# Patient Record
Sex: Female | Born: 1945 | Race: Black or African American | Hispanic: No | Marital: Married | Smoking: Never smoker
Health system: Southern US, Community
[De-identification: ages and names within clinical notes are randomized; demographics above are authoritative.]

## PROBLEM LIST (undated history)

## (undated) DIAGNOSIS — D696 Thrombocytopenia, unspecified: Secondary | ICD-10-CM

## (undated) DIAGNOSIS — E7800 Pure hypercholesterolemia, unspecified: Secondary | ICD-10-CM

## (undated) DIAGNOSIS — R7303 Prediabetes: Secondary | ICD-10-CM

## (undated) DIAGNOSIS — H25019 Cortical age-related cataract, unspecified eye: Secondary | ICD-10-CM

## (undated) DIAGNOSIS — E559 Vitamin D deficiency, unspecified: Secondary | ICD-10-CM

## (undated) DIAGNOSIS — E669 Obesity, unspecified: Secondary | ICD-10-CM

## (undated) DIAGNOSIS — K219 Gastro-esophageal reflux disease without esophagitis: Secondary | ICD-10-CM

## (undated) HISTORY — PX: ABDOMINAL HYSTERECTOMY: SHX81

## (undated) HISTORY — PX: COLONOSCOPY: SHX174

---

## 2004-12-30 ENCOUNTER — Ambulatory Visit: Payer: Self-pay | Admitting: Family Medicine

## 2006-01-08 ENCOUNTER — Ambulatory Visit: Payer: Self-pay | Admitting: Family Medicine

## 2007-04-20 ENCOUNTER — Ambulatory Visit: Payer: Self-pay

## 2015-03-26 ENCOUNTER — Encounter: Payer: Self-pay | Admitting: Emergency Medicine

## 2015-03-26 ENCOUNTER — Ambulatory Visit
Admission: EM | Admit: 2015-03-26 | Discharge: 2015-03-26 | Disposition: A | Discharge status: Home / Self Care | Payer: Medicare HMO | Attending: Family Medicine | Admitting: Family Medicine

## 2015-03-26 DIAGNOSIS — M549 Dorsalgia, unspecified: Secondary | ICD-10-CM

## 2015-03-26 DIAGNOSIS — H0260 Xanthelasma of unspecified eye, unspecified eyelid: Secondary | ICD-10-CM

## 2015-03-26 DIAGNOSIS — G8929 Other chronic pain: Secondary | ICD-10-CM | POA: Diagnosis not present

## 2015-03-26 DIAGNOSIS — M542 Cervicalgia: Secondary | ICD-10-CM | POA: Diagnosis not present

## 2015-03-26 NOTE — ED Notes (Signed)
Patient c/o ongoing back off and on for years.  Patient states pain has gotten worse over the past couple of days.  Patient states that it has been a long time since she has seen a doctor.

## 2015-03-26 NOTE — Discharge Instructions (Signed)

## 2015-03-26 NOTE — ED Provider Notes (Signed)
CSN: [id number]     Arrival date & time 03/26/15  1226 History   First MD Initiated Contact with Patient 03/26/15 1400     Chief Complaint  Patient presents with  . Back Pain   (Consider location/radiation/quality/duration/timing/severity/associated sxs/prior Treatment) HPI        69 year old female presents with multiple complaints. She has back pain for many years that has not changed at all in the past year, a sensation of fullness in her neck that has been constant for 2 years, skin lesions on her face that have been there as long as she can remember. Also she wants her cholesterol and thyroid checked. She does not have a primary care doctor. Nothing has changed about any of these issues recently. She denies any extremity numbness or weakness. No loss of bowel or bladder control. No chest pain or shortness of breath  History reviewed. No pertinent past medical history. Past Surgical History  Procedure Laterality Date  . Abdominal hysterectomy     History reviewed. No pertinent family history. History  Substance Use Topics  . Smoking status: Never Smoker   . Smokeless tobacco: Never Used  . Alcohol Use: No   OB History    No data available     Review of Systems  Constitutional: Negative for fever and chills.  Eyes: Negative for visual disturbance.  Respiratory: Negative for cough and shortness of breath.   Cardiovascular: Negative for chest pain, palpitations and leg swelling.  Gastrointestinal: Negative for nausea, vomiting and abdominal pain.  Endocrine: Negative for polydipsia and polyuria.  Genitourinary: Negative for dysuria, urgency and frequency.  Musculoskeletal: Positive for back pain and neck pain. Negative for myalgias, arthralgias and neck stiffness.  Skin: Negative for rash.       Lesions around eyes  Neurological: Negative for dizziness, weakness, light-headedness and numbness.  All other systems reviewed and are negative.   Allergies  Lipitor  Home  Medications   Prior to Admission medications   Not on File   BP 154/86 mmHg  Pulse 67  Temp(Src) 97 F (36.1 C) (Tympanic)  Resp 17  Ht 5\' 2"  (1.575 m)  Wt 208 lb 9.6 oz (94.62 kg)  BMI 38.14 kg/m2  SpO2 98% Physical Exam  Constitutional: She is oriented to person, place, and time. Vital signs are normal. She appears well-developed and well-nourished. No distress.  HENT:  Head: Normocephalic and atraumatic.  Eyes: Conjunctivae are normal.  Xanthelasma around eyes  Neck: Trachea normal, normal range of motion and full passive range of motion without pain. Neck supple. Carotid bruit is not present. No thyroid mass and no thyromegaly present.  Cardiovascular: Normal rate, regular rhythm, normal heart sounds and intact distal pulses.   Pulmonary/Chest: Effort normal and breath sounds normal. No respiratory distress. She has no wheezes. She has no rales.  Lymphadenopathy:    She has no cervical adenopathy.  Neurological: She is alert and oriented to person, place, and time. She has normal strength. Coordination normal.  Skin: Skin is warm and dry. No rash noted. She is not diaphoretic.  Psychiatric: She has a normal mood and affect. Judgment normal.  Nursing note and vitals reviewed.   ED Course  Procedures (including critical care time) Labs Review Labs Reviewed - No data to display  Imaging Review No results found.   MDM   1. Chronic back pain   2. Chronic neck pain   3. Xanthelasma, unspecified laterality    Multiple chronic medical problems. No red flags. She  needs a primary care provider. I referred her to the clinic upstairs, she will go upstairs when she leaves here to schedule an appointment.      Liam Graham, PA-C 03/26/15 1425

## 2015-03-28 LAB — LIPID PANEL
CHOLESTEROL: 274 mg/dL — AB (ref 0–200)
HDL: 54 mg/dL (ref 35–70)
LDL CALC: 204 mg/dL

## 2015-03-28 LAB — HEMOGLOBIN A1C: Hgb A1c MFr Bld: 5.9 % (ref 4.0–6.0)

## 2015-04-30 ENCOUNTER — Ambulatory Visit: Payer: Self-pay | Admitting: Family Medicine

## 2015-05-09 ENCOUNTER — Ambulatory Visit (INDEPENDENT_AMBULATORY_CARE_PROVIDER_SITE_OTHER): Payer: Medicare HMO | Admitting: Family Medicine

## 2015-05-09 ENCOUNTER — Encounter: Payer: Self-pay | Admitting: Family Medicine

## 2015-05-09 ENCOUNTER — Other Ambulatory Visit: Payer: Self-pay | Admitting: Family Medicine

## 2015-05-09 VITALS — BP 122/80 | HR 76 | Resp 18 | Ht 62.0 in | Wt 199.8 lb

## 2015-05-09 DIAGNOSIS — M549 Dorsalgia, unspecified: Secondary | ICD-10-CM

## 2015-05-09 DIAGNOSIS — E668 Other obesity: Secondary | ICD-10-CM

## 2015-05-09 DIAGNOSIS — M238X1 Other internal derangements of right knee: Secondary | ICD-10-CM

## 2015-05-09 DIAGNOSIS — E559 Vitamin D deficiency, unspecified: Secondary | ICD-10-CM

## 2015-05-09 DIAGNOSIS — E7800 Pure hypercholesterolemia, unspecified: Secondary | ICD-10-CM | POA: Insufficient documentation

## 2015-05-09 DIAGNOSIS — E669 Obesity, unspecified: Secondary | ICD-10-CM | POA: Insufficient documentation

## 2015-05-09 DIAGNOSIS — G8929 Other chronic pain: Secondary | ICD-10-CM | POA: Insufficient documentation

## 2015-05-09 DIAGNOSIS — R7303 Prediabetes: Secondary | ICD-10-CM

## 2015-05-09 DIAGNOSIS — R7309 Other abnormal glucose: Secondary | ICD-10-CM | POA: Diagnosis not present

## 2015-05-09 DIAGNOSIS — E66812 Obesity, class 2: Secondary | ICD-10-CM | POA: Insufficient documentation

## 2015-05-09 NOTE — Progress Notes (Signed)
Date:  05/09/2015   Name:  Molly Perez   DOB:  19-Jan-1946   MRN:  [id number]  PCP:  No primary care provider on file.    Chief Complaint: Follow-up; Back Pain; and Diabetes   History of Present Illness:  This is a 69 y.o. female for f/u prediabetes and vit d deficiency, has lost 7# past month by eliminating sugary sodas. Unable to exercise due to ligament injury R knee, saw [facility] ortho, told wouldn't fix unless athlete. Taking increased vit D supplement for past month. Told lesions around eyes might be chol deposits, Lipitor made legs ache. Gets lump in throat on occasion, back doing ok for now.  Review of Systems:  Review of Systems  Respiratory: Negative for shortness of breath.   Cardiovascular: Negative for chest pain and leg swelling.    Patient Active Problem List   Diagnosis Date Noted  . Back pain, chronic 05/09/2015  . Hypercholesteremia 05/09/2015  . Extreme obesity 05/09/2015  . Prediabetes 05/09/2015  . Avitaminosis D 05/09/2015    Prior to Admission medications   Medication Sig Start Date End Date Taking? Authorizing Provider  Cholecalciferol (VITAMIN D3) 5000 UNITS CAPS Take 1 capsule by mouth daily. 03/29/15   Historical Provider, MD  ibuprofen (ADVIL,MOTRIN) 200 MG tablet Take 2 tablets by mouth every 4 (four) hours as needed.    Historical Provider, MD    Allergies  Allergen Reactions  . Lipitor [Atorvastatin] Other (See Comments)    Weakness in legs  . Penicillins     Pt states medication upsets her stomach    Past Surgical History  Procedure Laterality Date  . Abdominal hysterectomy      History  Substance Use Topics  . Smoking status: Never Smoker   . Smokeless tobacco: Never Used  . Alcohol Use: No    Family History  Problem Relation Age of Onset  . Alzheimer's disease Father   . Congestive Heart Failure Mother   . Diabetes Mother     Medication list has been reviewed and updated.  Physical Examination: BP 122/80 mmHg  Pulse 76   Resp 18  Ht 5\' 2"  (1.575 m)  Wt 199 lb 12.8 oz (90.629 kg)  BMI 36.53 kg/m2  SpO2 96%  Physical Exam  Constitutional: She appears well-developed and well-nourished. No distress.  Cardiovascular: Normal rate, regular rhythm and normal heart sounds.   Pulmonary/Chest: Effort normal and breath sounds normal.  Musculoskeletal: She exhibits no edema.  R knee tender over MCL    Assessment and Plan:  1. Avitaminosis D Recheck level on increased supplementation - Vitamin D (25 hydroxy)  2. Prediabetes Recheck a1c with weight loss - HgB A1c  3. Extreme obesity Encouraged continued weight loss, increased exercise  4. Hypercholesteremia Recheck with weight loss - Lipid Profile  5. MCL deficiency, knee, right Ok to use knee brace with walking, consider PT if persists (saw [facility] ortho, no surgery recommended)  Return in about 6 months (around 11/09/2015).  Satira Anis. [facility]  05/09/2015

## 2015-05-10 ENCOUNTER — Encounter: Payer: Self-pay | Admitting: Family Medicine

## 2015-05-10 LAB — LIPID PANEL
CHOL/HDL RATIO: 6.2 ratio — AB (ref 0.0–4.4)
Cholesterol, Total: 267 mg/dL — ABNORMAL HIGH (ref 100–199)
HDL: 43 mg/dL (ref >39)
LDL CALC: 206 mg/dL — AB (ref 0–99)
Triglycerides: 88 mg/dL (ref 0–149)
VLDL Cholesterol Cal: 18 mg/dL (ref 5–40)

## 2015-05-10 LAB — VITAMIN D 25 HYDROXY (VIT D DEFICIENCY, FRACTURES)
VIT D 25 HYDROXY: 32.1 ng/mL (ref 30.0–100.0)
VITAMIN D, 25-OH, TOTAL: 19

## 2015-05-10 LAB — HEMOGLOBIN A1C
ESTIMATED AVERAGE GLUCOSE: 123 mg/dL
Hgb A1c MFr Bld: 5.9 % — ABNORMAL HIGH (ref 4.8–5.6)

## 2015-07-31 ENCOUNTER — Encounter: Payer: Self-pay | Admitting: Family Medicine

## 2015-07-31 ENCOUNTER — Ambulatory Visit (INDEPENDENT_AMBULATORY_CARE_PROVIDER_SITE_OTHER): Payer: Medicare HMO | Admitting: Family Medicine

## 2015-07-31 VITALS — BP 148/80 | HR 64 | Ht 60.0 in | Wt 193.2 lb

## 2015-07-31 DIAGNOSIS — R7309 Other abnormal glucose: Secondary | ICD-10-CM

## 2015-07-31 DIAGNOSIS — E538 Deficiency of other specified B group vitamins: Secondary | ICD-10-CM

## 2015-07-31 DIAGNOSIS — M238X1 Other internal derangements of right knee: Secondary | ICD-10-CM

## 2015-07-31 DIAGNOSIS — R221 Localized swelling, mass and lump, neck: Secondary | ICD-10-CM | POA: Diagnosis not present

## 2015-07-31 DIAGNOSIS — M2391 Unspecified internal derangement of right knee: Secondary | ICD-10-CM | POA: Insufficient documentation

## 2015-07-31 DIAGNOSIS — E668 Other obesity: Secondary | ICD-10-CM

## 2015-07-31 DIAGNOSIS — E559 Vitamin D deficiency, unspecified: Secondary | ICD-10-CM | POA: Diagnosis not present

## 2015-07-31 DIAGNOSIS — Z23 Encounter for immunization: Secondary | ICD-10-CM

## 2015-07-31 DIAGNOSIS — E7800 Pure hypercholesterolemia, unspecified: Secondary | ICD-10-CM

## 2015-07-31 DIAGNOSIS — R7303 Prediabetes: Secondary | ICD-10-CM

## 2015-07-31 DIAGNOSIS — M238X9 Other internal derangements of unspecified knee: Secondary | ICD-10-CM | POA: Insufficient documentation

## 2015-07-31 NOTE — Progress Notes (Signed)
Date:  07/31/2015   Name:  Molly Perez   DOB:  May 13, 1946   MRN:  [id number]  PCP:  Adline Potter, MD    Chief Complaint: No chief complaint on file.   History of Present Illness:  This is a 69 y.o. female for f/u prediabetes, obesity, and vit D def. C/o occ lump in throat but no trouble swallowing, thinks had abnormal thyroid test in past. Has lost 6# since last visit, told heart murmur by visiting nurse. Taking vit D supplement daily. Lipids high last visit but intolerant Lipitor due to leg weakness. Needs flu and Pneumovax today. Had normal colonoscopy in past (unsure when) and not interested in another. Declines mammogram. Knee still bothers but tolerable.  Review of Systems:  Review of Systems  Constitutional: Negative for fever, chills and fatigue.  HENT: Negative for trouble swallowing.   Respiratory: Negative for shortness of breath.   Cardiovascular: Negative for chest pain and leg swelling.  Endocrine: Negative for polyuria.  Genitourinary: Negative for difficulty urinating.    Patient Active Problem List   Diagnosis Date Noted  . 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 Back pain, chronic 05/09/2015  . Hypercholesteremia 05/09/2015  . Extreme obesity 05/09/2015  . Prediabetes 05/09/2015  . Avitaminosis D 05/09/2015    Prior to Admission medications   Medication Sig Start Date End Date Taking? Authorizing Provider  Cholecalciferol (VITAMIN D3) 5000 UNITS CAPS Take 1 capsule by mouth daily. 03/29/15  Yes Historical Provider, MD  ibuprofen (ADVIL,MOTRIN) 200 MG tablet Take 2 tablets by mouth every 4 (four) hours as needed.   Yes Historical Provider, MD    Allergies  Allergen Reactions  . Lipitor [Atorvastatin] Other (See Comments)    Weakness in legs  . Penicillins     Pt states medication upsets her stomach    Past Surgical History  Procedure Laterality Date  . Abdominal hysterectomy      Social History  Substance Use Topics  . Smoking status: Never Smoker    . Smokeless tobacco: Never Used  . Alcohol Use: No    Family History  Problem Relation Age of Onset  . Alzheimer's disease Father   . Congestive Heart Failure Mother   . Diabetes Mother     Medication list has been reviewed and updated.  Physical Examination: BP 148/80 mmHg  Pulse 64  Ht 5' (1.524 m)  Wt 193 lb 3.2 oz (87.635 kg)  BMI 37.73 kg/m2  Physical Exam  Constitutional: She appears well-developed and well-nourished. No distress.  HENT:  Mouth/Throat: Oropharynx is clear and moist.  Neck: Neck supple. No thyromegaly present.  Cardiovascular: Normal rate and regular rhythm.   2/6 SEM at apex  Pulmonary/Chest: Effort normal and breath sounds normal.  Musculoskeletal: She exhibits no edema.  Lymphadenopathy:    She has no cervical adenopathy.  Neurological: She is alert. Coordination normal.  Skin: Skin is warm and dry. She is not diaphoretic.  Psychiatric: She has a normal mood and affect. Her behavior is normal.    Assessment and Plan:  1. Prediabetes Well controlled, recheck a1c - HgB A1c  2. Extreme obesity Continue current weight loss, hep C screen recommended by insurance - B12 - TSH - Hepatitis C Antibody  3. Avitaminosis D On high dose supplementation - Vitamin D (25 hydroxy)  4. Sensation of lump in throat No abnormal PE findings, monitor  5. Need for influenza vaccination - Flu Vaccine QUAD 36+ mos PF IM (Fluarix & Fluzone Quad PF) - Pneumovax imm  today  Return in about 4 months (around 11/30/2015).  Satira Anis. [facility]  07/31/2015

## 2015-08-01 LAB — TSH: TSH: 0.666 u[IU]/mL (ref 0.450–4.500)

## 2015-08-01 LAB — VITAMIN B12: Vitamin B-12: 217 pg/mL (ref 211–946)

## 2015-08-01 LAB — HEPATITIS C ANTIBODY

## 2015-08-01 LAB — HEMOGLOBIN A1C
Est. average glucose Bld gHb Est-mCnc: 123 mg/dL
Hgb A1c MFr Bld: 5.9 % — ABNORMAL HIGH (ref 4.8–5.6)

## 2015-08-01 LAB — VITAMIN D 25 HYDROXY (VIT D DEFICIENCY, FRACTURES): Vit D, 25-Hydroxy: 46.5 ng/mL (ref 30.0–100.0)

## 2015-08-01 MED ORDER — VITAMIN B-12 1000 MCG PO TABS: 1000.0000 ug | ORAL_TABLET | Freq: Every day | ORAL | Status: AC

## 2015-08-01 NOTE — Addendum Note (Signed)
Addended by: Adline Potter on: 08/01/2015 08:41 AM   Modules accepted: Orders

## 2015-09-12 DIAGNOSIS — M25462 Effusion, left knee: Secondary | ICD-10-CM | POA: Diagnosis not present

## 2015-09-12 DIAGNOSIS — S8992XA Unspecified injury of left lower leg, initial encounter: Secondary | ICD-10-CM | POA: Diagnosis not present

## 2015-09-12 DIAGNOSIS — M25562 Pain in left knee: Secondary | ICD-10-CM | POA: Diagnosis not present

## 2016-01-23 DIAGNOSIS — M79621 Pain in right upper arm: Secondary | ICD-10-CM | POA: Diagnosis not present

## 2016-01-23 DIAGNOSIS — E6609 Other obesity due to excess calories: Secondary | ICD-10-CM | POA: Diagnosis not present

## 2016-01-23 DIAGNOSIS — E7800 Pure hypercholesterolemia, unspecified: Secondary | ICD-10-CM | POA: Diagnosis not present

## 2016-01-23 DIAGNOSIS — Z1239 Encounter for other screening for malignant neoplasm of breast: Secondary | ICD-10-CM | POA: Diagnosis not present

## 2016-01-23 DIAGNOSIS — R7303 Prediabetes: Secondary | ICD-10-CM | POA: Diagnosis not present

## 2016-01-23 DIAGNOSIS — E559 Vitamin D deficiency, unspecified: Secondary | ICD-10-CM | POA: Diagnosis not present

## 2016-03-05 DIAGNOSIS — H25813 Combined forms of age-related cataract, bilateral: Secondary | ICD-10-CM | POA: Diagnosis not present

## 2016-03-10 ENCOUNTER — Other Ambulatory Visit: Payer: Self-pay | Admitting: Pediatrics

## 2016-03-10 DIAGNOSIS — Z1231 Encounter for screening mammogram for malignant neoplasm of breast: Secondary | ICD-10-CM

## 2016-03-17 ENCOUNTER — Other Ambulatory Visit: Payer: Self-pay | Admitting: Pediatrics

## 2016-03-17 DIAGNOSIS — E559 Vitamin D deficiency, unspecified: Secondary | ICD-10-CM

## 2016-03-20 ENCOUNTER — Ambulatory Visit
Admission: EM | Admit: 2016-03-20 | Discharge: 2016-03-20 | Disposition: A | Discharge status: Home / Self Care | Payer: Medicare HMO | Attending: Emergency Medicine | Admitting: Emergency Medicine

## 2016-03-20 ENCOUNTER — Encounter: Payer: Self-pay | Admitting: Emergency Medicine

## 2016-03-20 DIAGNOSIS — W57XXXA Bitten or stung by nonvenomous insect and other nonvenomous arthropods, initial encounter: Secondary | ICD-10-CM

## 2016-03-20 DIAGNOSIS — T148 Other injury of unspecified body region: Secondary | ICD-10-CM

## 2016-03-20 MED ORDER — MUPIROCIN 2 % EX OINT: 1.0000 "application " | TOPICAL_OINTMENT | Freq: Three times a day (TID) | CUTANEOUS | Status: DC

## 2016-03-20 NOTE — ED Notes (Signed)
Pt presents with tick bite to back, husband pulled off this am but pt believes that it may be some left in the skin.

## 2016-03-20 NOTE — ED Provider Notes (Signed)
HPI  SUBJECTIVE:  Molly Perez is a 70 y.o. female who presents for tick removal. She states that her husband removed a non-engorged tick on her left scapula earlier today, but is concerned because there "is some left the skin". No nausea vomiting fevers bodyaches rash, flulike symptoms. No antipyretic in past 6-8 hours. She is unsure how long the tick was attached. Past medical history negative for diabetes, hypertension. PMD: Dr. Janene Harvey.   History reviewed. No pertinent past medical history.  Past Surgical History  Procedure Laterality Date  . Abdominal hysterectomy      Family History  Problem Relation Age of Onset  . Alzheimer's disease Father   . Congestive Heart Failure Mother   . Diabetes Mother     Social History  Substance Use Topics  . Smoking status: Never Smoker   . Smokeless tobacco: Never Used  . Alcohol Use: No    No current facility-administered medications for this encounter.  Current outpatient prescriptions:  .  Cholecalciferol (VITAMIN D3) 5000 UNITS CAPS, Take 1 capsule by mouth daily., Disp: , Rfl:  .  ibuprofen (ADVIL,MOTRIN) 200 MG tablet, Take 2 tablets by mouth every 4 (four) hours as needed., Disp: , Rfl:  .  mupirocin ointment (BACTROBAN) 2 %, Apply 1 application topically 3 (three) times daily., Disp: 22 g, Rfl: 0 .  vitamin B-12 (CYANOCOBALAMIN) 1000 MCG tablet, Take 1 tablet (1,000 mcg total) by mouth daily., Disp: 30 tablet, Rfl: 0  Allergies  Allergen Reactions  . Lipitor [Atorvastatin] Other (See Comments)    Weakness in legs  . Penicillins     Pt states medication upsets her stomach     ROS  As noted in HPI.   Physical Exam  BP 148/98 mmHg  Pulse 61  Temp(Src) 97.8 F (36.6 C) (Oral)  Resp 18  SpO2 100%  Constitutional: Well developed, well nourished, no acute distress Eyes:  EOMI, conjunctiva normal bilaterally HENT: Normocephalic, atraumatic,mucus membranes moist Respiratory: Normal inspiratory  effort Cardiovascular: Normal rate GI: nondistended skin: Nontender area of erythema with central tick head buried in skin left scapula. Body absent. No bull's-eye rash. No crusting, expressible purulent drainage.  Musculoskeletal: no deformities Neurologic: Alert & oriented x 3, no focal neuro deficits Psychiatric: Speech and behavior appropriate   ED Course   Medications - No data to display  No orders of the defined types were placed in this encounter.    No results found for this or any previous visit (from the past 24 hour(s)). No results found.  ED Clinical Impression  Tick bite   ED Assessment/Plan  Procedure note: Cleaned area with alcohol. Using magnifying glass, forceps and an 18-gauge needle removed tick head in its entirety. Applied bacitracin and a Band-Aid. Patient tolerated procedure well.  Home with Bactroban, local wound care. Given that the tick was not engorged, do not think that she needs antibiotic prophylaxis at this time. She has follow-up with her primary care physician as  scheduled. Discussed signs and symptoms that should prompt return to the urgent care or ER. Patient agrees with plan.  *This clinic note was created using Dragon dictation software. Therefore, there may be occasional mistakes despite careful proofreading.  ?   Melynda Ripple, MD 03/20/16 1254

## 2016-03-20 NOTE — Discharge Instructions (Signed)
Follow-up with your primary care physician as scheduled.. Watch for signs of infection. Return here or see her doctor if it appears to be infected. Keep this clean with soap and water and covered with bacitracin or Bactroban ointment to help prevent infection. Return to the ER for a bull's-eye rash or for the signs and symptoms we discussed.

## 2016-03-24 DIAGNOSIS — Z23 Encounter for immunization: Secondary | ICD-10-CM | POA: Diagnosis not present

## 2016-03-24 DIAGNOSIS — Z0000 Encounter for general adult medical examination without abnormal findings: Secondary | ICD-10-CM | POA: Diagnosis not present

## 2016-03-24 DIAGNOSIS — E7800 Pure hypercholesterolemia, unspecified: Secondary | ICD-10-CM | POA: Diagnosis not present

## 2016-03-24 DIAGNOSIS — Z1239 Encounter for other screening for malignant neoplasm of breast: Secondary | ICD-10-CM | POA: Diagnosis not present

## 2016-03-24 DIAGNOSIS — R7301 Impaired fasting glucose: Secondary | ICD-10-CM | POA: Diagnosis not present

## 2016-03-24 DIAGNOSIS — E6609 Other obesity due to excess calories: Secondary | ICD-10-CM | POA: Diagnosis not present

## 2016-03-24 DIAGNOSIS — Z1211 Encounter for screening for malignant neoplasm of colon: Secondary | ICD-10-CM | POA: Diagnosis not present

## 2016-03-24 DIAGNOSIS — E559 Vitamin D deficiency, unspecified: Secondary | ICD-10-CM | POA: Diagnosis not present

## 2016-03-24 DIAGNOSIS — R5383 Other fatigue: Secondary | ICD-10-CM | POA: Diagnosis not present

## 2016-04-07 ENCOUNTER — Ambulatory Visit
Admission: RE | Admit: 2016-04-07 | Discharge: 2016-04-07 | Disposition: A | Discharge status: Home / Self Care | Payer: Medicare HMO | Source: Ambulatory Visit | Attending: Pediatrics | Admitting: Pediatrics

## 2016-04-07 DIAGNOSIS — M8588 Other specified disorders of bone density and structure, other site: Secondary | ICD-10-CM | POA: Diagnosis not present

## 2016-04-07 DIAGNOSIS — M85851 Other specified disorders of bone density and structure, right thigh: Secondary | ICD-10-CM | POA: Diagnosis not present

## 2016-04-07 DIAGNOSIS — E559 Vitamin D deficiency, unspecified: Secondary | ICD-10-CM | POA: Diagnosis not present

## 2016-04-07 DIAGNOSIS — Z1231 Encounter for screening mammogram for malignant neoplasm of breast: Secondary | ICD-10-CM | POA: Insufficient documentation

## 2016-04-10 ENCOUNTER — Other Ambulatory Visit: Payer: Self-pay | Admitting: Pediatrics

## 2016-04-10 DIAGNOSIS — R928 Other abnormal and inconclusive findings on diagnostic imaging of breast: Secondary | ICD-10-CM

## 2016-04-10 DIAGNOSIS — K219 Gastro-esophageal reflux disease without esophagitis: Secondary | ICD-10-CM | POA: Diagnosis not present

## 2016-04-10 DIAGNOSIS — Z1211 Encounter for screening for malignant neoplasm of colon: Secondary | ICD-10-CM | POA: Diagnosis not present

## 2016-04-10 DIAGNOSIS — R1319 Other dysphagia: Secondary | ICD-10-CM | POA: Diagnosis not present

## 2016-04-11 ENCOUNTER — Other Ambulatory Visit: Payer: Self-pay | Admitting: Nurse Practitioner

## 2016-04-11 DIAGNOSIS — K219 Gastro-esophageal reflux disease without esophagitis: Secondary | ICD-10-CM

## 2016-04-11 DIAGNOSIS — R1319 Other dysphagia: Secondary | ICD-10-CM

## 2016-04-17 ENCOUNTER — Ambulatory Visit
Admission: RE | Admit: 2016-04-17 | Discharge: 2016-04-17 | Disposition: A | Discharge status: Home / Self Care | Payer: Medicare HMO | Source: Ambulatory Visit | Attending: Nurse Practitioner | Admitting: Nurse Practitioner

## 2016-04-17 DIAGNOSIS — K219 Gastro-esophageal reflux disease without esophagitis: Secondary | ICD-10-CM | POA: Insufficient documentation

## 2016-04-17 DIAGNOSIS — R1310 Dysphagia, unspecified: Secondary | ICD-10-CM | POA: Diagnosis not present

## 2016-04-17 DIAGNOSIS — R1319 Other dysphagia: Secondary | ICD-10-CM

## 2016-04-21 ENCOUNTER — Ambulatory Visit
Admission: RE | Admit: 2016-04-21 | Discharge: 2016-04-21 | Disposition: A | Discharge status: Home / Self Care | Payer: Medicare HMO | Source: Ambulatory Visit | Attending: Pediatrics | Admitting: Pediatrics

## 2016-04-21 DIAGNOSIS — R921 Mammographic calcification found on diagnostic imaging of breast: Secondary | ICD-10-CM | POA: Insufficient documentation

## 2016-04-21 DIAGNOSIS — R928 Other abnormal and inconclusive findings on diagnostic imaging of breast: Secondary | ICD-10-CM

## 2016-05-27 DIAGNOSIS — K219 Gastro-esophageal reflux disease without esophagitis: Secondary | ICD-10-CM | POA: Diagnosis not present

## 2016-05-27 DIAGNOSIS — Z0000 Encounter for general adult medical examination without abnormal findings: Secondary | ICD-10-CM | POA: Diagnosis not present

## 2016-06-05 DIAGNOSIS — M1712 Unilateral primary osteoarthritis, left knee: Secondary | ICD-10-CM | POA: Diagnosis not present

## 2016-06-05 DIAGNOSIS — M1710 Unilateral primary osteoarthritis, unspecified knee: Secondary | ICD-10-CM | POA: Diagnosis not present

## 2016-06-05 DIAGNOSIS — M5136 Other intervertebral disc degeneration, lumbar region: Secondary | ICD-10-CM | POA: Diagnosis not present

## 2016-06-05 DIAGNOSIS — M170 Bilateral primary osteoarthritis of knee: Secondary | ICD-10-CM | POA: Diagnosis not present

## 2016-06-05 DIAGNOSIS — M1711 Unilateral primary osteoarthritis, right knee: Secondary | ICD-10-CM | POA: Diagnosis not present

## 2016-06-05 DIAGNOSIS — M545 Low back pain: Secondary | ICD-10-CM | POA: Diagnosis not present

## 2016-07-01 DIAGNOSIS — R1319 Other dysphagia: Secondary | ICD-10-CM | POA: Diagnosis not present

## 2016-07-01 DIAGNOSIS — K219 Gastro-esophageal reflux disease without esophagitis: Secondary | ICD-10-CM | POA: Diagnosis not present

## 2016-07-03 ENCOUNTER — Encounter: Payer: Self-pay | Admitting: *Deleted

## 2016-07-04 ENCOUNTER — Encounter
Admission: RE | Disposition: A | Discharge status: Home / Self Care | Payer: Self-pay | Source: Ambulatory Visit | Attending: Gastroenterology

## 2016-07-04 ENCOUNTER — Ambulatory Visit: Payer: Medicare HMO | Admitting: Anesthesiology

## 2016-07-04 ENCOUNTER — Ambulatory Visit
Admission: RE | Admit: 2016-07-04 | Discharge: 2016-07-04 | Disposition: A | Discharge status: Home / Self Care | Payer: Medicare HMO | Source: Ambulatory Visit | Attending: Gastroenterology | Admitting: Gastroenterology

## 2016-07-04 ENCOUNTER — Encounter: Payer: Self-pay | Admitting: *Deleted

## 2016-07-04 DIAGNOSIS — Z888 Allergy status to other drugs, medicaments and biological substances status: Secondary | ICD-10-CM | POA: Diagnosis not present

## 2016-07-04 DIAGNOSIS — B9681 Helicobacter pylori [H. pylori] as the cause of diseases classified elsewhere: Secondary | ICD-10-CM | POA: Insufficient documentation

## 2016-07-04 DIAGNOSIS — K2960 Other gastritis without bleeding: Secondary | ICD-10-CM | POA: Diagnosis not present

## 2016-07-04 DIAGNOSIS — E559 Vitamin D deficiency, unspecified: Secondary | ICD-10-CM | POA: Diagnosis not present

## 2016-07-04 DIAGNOSIS — E7800 Pure hypercholesterolemia, unspecified: Secondary | ICD-10-CM | POA: Diagnosis not present

## 2016-07-04 DIAGNOSIS — K2970 Gastritis, unspecified, without bleeding: Secondary | ICD-10-CM | POA: Diagnosis not present

## 2016-07-04 DIAGNOSIS — K219 Gastro-esophageal reflux disease without esophagitis: Secondary | ICD-10-CM | POA: Diagnosis not present

## 2016-07-04 DIAGNOSIS — Z79899 Other long term (current) drug therapy: Secondary | ICD-10-CM | POA: Diagnosis not present

## 2016-07-04 DIAGNOSIS — Z880 Allergy status to penicillin: Secondary | ICD-10-CM | POA: Insufficient documentation

## 2016-07-04 DIAGNOSIS — E669 Obesity, unspecified: Secondary | ICD-10-CM | POA: Diagnosis not present

## 2016-07-04 DIAGNOSIS — R1310 Dysphagia, unspecified: Secondary | ICD-10-CM | POA: Diagnosis not present

## 2016-07-04 DIAGNOSIS — Z1211 Encounter for screening for malignant neoplasm of colon: Secondary | ICD-10-CM | POA: Diagnosis not present

## 2016-07-04 DIAGNOSIS — Z6837 Body mass index (BMI) 37.0-37.9, adult: Secondary | ICD-10-CM | POA: Insufficient documentation

## 2016-07-04 DIAGNOSIS — K2900 Acute gastritis without bleeding: Secondary | ICD-10-CM | POA: Diagnosis not present

## 2016-07-04 DIAGNOSIS — R7303 Prediabetes: Secondary | ICD-10-CM | POA: Insufficient documentation

## 2016-07-04 DIAGNOSIS — K449 Diaphragmatic hernia without obstruction or gangrene: Secondary | ICD-10-CM | POA: Diagnosis not present

## 2016-07-04 HISTORY — DX: Cortical age-related cataract, unspecified eye: H25.019

## 2016-07-04 HISTORY — DX: Vitamin D deficiency, unspecified: E55.9

## 2016-07-04 HISTORY — DX: Gastro-esophageal reflux disease without esophagitis: K21.9

## 2016-07-04 HISTORY — DX: Prediabetes: R73.03

## 2016-07-04 HISTORY — DX: Obesity, unspecified: E66.9

## 2016-07-04 HISTORY — DX: Pure hypercholesterolemia, unspecified: E78.00

## 2016-07-04 HISTORY — PX: ESOPHAGOGASTRODUODENOSCOPY (EGD) WITH PROPOFOL: SHX5813

## 2016-07-04 HISTORY — PX: COLONOSCOPY WITH PROPOFOL: SHX5780

## 2016-07-04 SURGERY — COLONOSCOPY WITH PROPOFOL
Anesthesia: General

## 2016-07-04 MED ORDER — LIDOCAINE HCL (CARDIAC) 20 MG/ML IV SOLN
INTRAVENOUS | Status: DC | PRN
  Administered 2016-07-04: 100 mg via INTRAVENOUS

## 2016-07-04 MED ORDER — GLYCOPYRROLATE 0.2 MG/ML IJ SOLN
INTRAMUSCULAR | Status: DC | PRN
  Administered 2016-07-04: 0.2 mg via INTRAVENOUS

## 2016-07-04 MED ORDER — SODIUM CHLORIDE 0.9 % IV SOLN
INTRAVENOUS | Status: DC
  Administered 2016-07-04: 10:00:00 via INTRAVENOUS

## 2016-07-04 MED ORDER — PROPOFOL 500 MG/50ML IV EMUL
INTRAVENOUS | Status: DC | PRN
Start: 2016-07-04 — End: 2016-07-04
  Administered 2016-07-04: 150 ug/kg/min via INTRAVENOUS

## 2016-07-04 MED ORDER — PROPOFOL 10 MG/ML IV BOLUS
INTRAVENOUS | Status: DC | PRN
  Administered 2016-07-04: 60 mg via INTRAVENOUS
  Administered 2016-07-04: 50 mg via INTRAVENOUS

## 2016-07-04 MED ORDER — SODIUM CHLORIDE 0.9 % IV SOLN: INTRAVENOUS | Status: DC

## 2016-07-04 NOTE — Transfer of Care (Signed)
Immediate Anesthesia Transfer of Care Note  Patient: Molly Perez  Procedure(s) Performed: Procedure(s): COLONOSCOPY WITH PROPOFOL (N/A) ESOPHAGOGASTRODUODENOSCOPY (EGD) WITH PROPOFOL (N/A)  Patient Location: Endoscopy Unit  Anesthesia Type:General  Level of Consciousness: sedated  Airway & Oxygen Therapy: Patient Spontanous Breathing and Patient connected to nasal cannula oxygen  Post-op Assessment: Report given to RN and Post -op Vital signs reviewed and stable  Post vital signs: Reviewed and stable  Last Vitals:  Vitals:   07/04/16 0856  BP: (!) 162/92  Pulse: 64  Resp: 15  Temp: (!) 36.1 C    Last Pain:  Vitals:   07/04/16 0856  TempSrc: Tympanic         Complications: No apparent anesthesia complications

## 2016-07-04 NOTE — Op Note (Signed)
[facility] Gastroenterology Patient Name: Molly Perez Procedure Date: 07/04/2016 9:52 AM MRN: [id number] Account #: [id number] Date of Birth: [birth year] Admit Type: Outpatient Age: 70 Room: [facility] ENDO ROOM 3 Gender: Female Note Status: Finalized Procedure:            Upper GI endoscopy Indications:          Dysphagia, Gastro-esophageal reflux disease Providers:            Lollie Sails, MD Referring MD:         Ane Payment, MD (Referring MD) Medicines:            Monitored Anesthesia Care Complications:        No immediate complications. Procedure:            Pre-Anesthesia Assessment:                       - ASA Grade Assessment: II - A patient with mild                        systemic disease.                       After obtaining informed consent, the endoscope was                        passed under direct vision. Throughout the procedure,                        the patient's blood pressure, pulse, and oxygen                        saturations were monitored continuously. The Endoscope                        was introduced through the mouth, and advanced to the                        third part of duodenum. The upper GI endoscopy was                        accomplished without difficulty. The patient tolerated                        the procedure well. Findings:      The Z-line was variable. Biopsies were taken with a cold forceps for       histology.      prominant cricopharyngeus, no evidence of stenosis orr stricture      The exam of the esophagus was otherwise normal.      Diffuse mild mucosal variance characterized by altered texture,       papillary in appearance was found in the entire examined stomach.       Biopsies were taken with a cold forceps for histology.      The examined duodenum was normal.      A small hiatal hernia was found. The Z-line was a variable distance from       incisors; the hiatal hernia was sliding. Impression:            - Z-line variable. Biopsied.                       -  Gastric mucosal variant. Biopsied.                       - Normal examined duodenum. Recommendation:       - Continue present medications.                       - Await pathology results. Procedure Code(s):    --- Professional ---                       [phone], Esophagogastroduodenoscopy, flexible, transoral;                        with biopsy, single or multiple Diagnosis Code(s):    --- Professional ---                       K22.8, Other specified diseases of esophagus                       K31.89, Other diseases of stomach and duodenum                       R13.10, Dysphagia, unspecified                       K21.9, Gastro-esophageal reflux disease without                        esophagitis CPT copyright 2016 American [employer] Association. All rights reserved. The codes documented in this report are preliminary and upon coder review may  be revised to meet current compliance requirements. Lollie Sails, MD 07/04/2016 10:13:36 AM This report has been signed electronically. Number of Addenda: 0 Note Initiated On: 07/04/2016 9:52 AM      [facility]

## 2016-07-04 NOTE — H&P (Signed)
Outpatient short stay form Pre-procedure 07/04/2016 9:51 AM Lollie Sails MD  Primary Physician: Dr. Barbaraann Boys  Reason for visit:  EGD and colonoscopy  History of present illness:  Patient is a 70 year old female presenting today as above. She has a history some amount of swallowing difficulty however a barium swallow done on 04/17/2016 was completely normal with passage of tablet. She has very poor dentition. We discussed appropriate measures to help her with eating in regards to cutting and chewing appropriately as well as putting pills in thicker fluids or applesauce.  She tolerated her prep well. Takes no aspirin or blood thinning agents.    Current Facility-Administered Medications:  .  0.9 %  sodium chloride infusion, , Intravenous, Continuous, Lollie Sails, MD, Last Rate: 20 mL/hr at 07/04/16 0931 .  0.9 %  sodium chloride infusion, , Intravenous, Continuous, Lollie Sails, MD  Prescriptions Prior to Admission  Medication Sig Dispense Refill Last Dose  . Cholecalciferol (VITAMIN D3) 5000 UNITS CAPS Take 1 capsule by mouth daily.   Past Week at Unknown time  . ibuprofen (ADVIL,MOTRIN) 200 MG tablet Take 2 tablets by mouth every 4 (four) hours as needed.   Past Week at Unknown time  . mupirocin ointment (BACTROBAN) 2 % Apply 1 application topically 3 (three) times daily. 22 g 0 Past Week at Unknown time  . pantoprazole (PROTONIX) 40 MG tablet Take 40 mg by mouth daily.   Past Week at Unknown time  . vitamin B-12 (CYANOCOBALAMIN) 1000 MCG tablet Take 1 tablet (1,000 mcg total) by mouth daily. 30 tablet 0 Past Week at Unknown time     Allergies  Allergen Reactions  . Lipitor [Atorvastatin] Other (See Comments)    Weakness in legs  . Penicillins Other (See Comments)    Pt states medication upsets her stomach     Past Medical History:  Diagnosis Date  . Cataract cortical, senile   . GERD (gastroesophageal reflux disease)   . Hypercholesterolemia   . Obesity    . Prediabetes   . Vitamin D deficiency     Review of systems:      Physical Exam    Heart and lungs: Regular rate and rhythm without rub or gallop, lungs are bilaterally clear.    HEENT: Norm septic atraumatic eyes are anicteric    Other:     Pertinant exam for procedure: Soft nontender nondistended bowel sounds positive normoactive.    Planned proceedures: EGD, colonoscopy and indicated procedures. I have discussed the risks benefits and complications of procedures to include not limited to bleeding, infection, perforation and the risk of sedation and the patient wishes to proceed.    Lollie Sails, MD Gastroenterology 07/04/2016  9:51 AM

## 2016-07-04 NOTE — Anesthesia Preprocedure Evaluation (Signed)
Anesthesia Evaluation  Patient identified by MRN, date of birth, ID band Patient awake    Reviewed: Allergy & Precautions, H&P , NPO status , Patient's Chart, lab work & pertinent test results, reviewed documented beta blocker date and time   History of Anesthesia Complications Negative for: history of anesthetic complications  Airway Mallampati: I  TM Distance: >3 FB Neck ROM: full    Dental no notable dental hx. (+) Poor Dentition, Chipped, Missing   Pulmonary neg pulmonary ROS,    Pulmonary exam normal breath sounds clear to auscultation       Cardiovascular Exercise Tolerance: Good negative cardio ROS Normal cardiovascular exam Rhythm:regular Rate:Normal     Neuro/Psych negative neurological ROS  negative psych ROS   GI/Hepatic Neg liver ROS, GERD  ,  Endo/Other  diabetes (Pre-diabetic)  Renal/GU negative Renal ROS  negative genitourinary   Musculoskeletal   Abdominal   Peds  Hematology negative hematology ROS (+)   Anesthesia Other Findings Past Medical History: No date: Cataract cortical, senile No date: GERD (gastroesophageal reflux disease) No date: Hypercholesterolemia No date: Obesity No date: Prediabetes No date: Vitamin D deficiency   Reproductive/Obstetrics negative OB ROS                             Anesthesia Physical Anesthesia Plan  ASA: II  Anesthesia Plan: General   Post-op Pain Management:    Induction:   Airway Management Planned:   Additional Equipment:   Intra-op Plan:   Post-operative Plan:   Informed Consent: I have reviewed the patients History and Physical, chart, labs and discussed the procedure including the risks, benefits and alternatives for the proposed anesthesia with the patient or authorized representative who has indicated his/her understanding and acceptance.   Dental Advisory Given  Plan Discussed with: Anesthesiologist, CRNA  and Surgeon  Anesthesia Plan Comments:         Anesthesia Quick Evaluation

## 2016-07-04 NOTE — Anesthesia Postprocedure Evaluation (Signed)
Anesthesia Post Note  Patient: Molly Perez  Procedure(s) Performed: Procedure(s) (LRB): COLONOSCOPY WITH PROPOFOL (N/A) ESOPHAGOGASTRODUODENOSCOPY (EGD) WITH PROPOFOL (N/A)  Patient location during evaluation: Endoscopy Anesthesia Type: General Level of consciousness: awake and alert Pain management: pain level controlled Vital Signs Assessment: post-procedure vital signs reviewed and stable Respiratory status: spontaneous breathing, nonlabored ventilation, respiratory function stable and patient connected to nasal cannula oxygen Cardiovascular status: blood pressure returned to baseline and stable Postop Assessment: no signs of nausea or vomiting Anesthetic complications: no    Last Vitals:  Vitals:   07/04/16 1050 07/04/16 1100  BP: 135/90 (!) 144/97  Pulse:    Resp:    Temp:      Last Pain:  Vitals:   07/04/16 1030  TempSrc: Tympanic                 Martha Clan

## 2016-07-04 NOTE — Op Note (Signed)
[facility] Gastroenterology Patient Name: Molly Perez Procedure Date: 07/04/2016 9:52 AM MRN: [id number] Account #: [id number] Date of Birth: [birth year] Admit Type: Outpatient Age: 70 Room: [facility] ENDO ROOM 3 Gender: Female Note Status: Finalized Procedure:            Colonoscopy Indications:          Screening for colorectal malignant neoplasm Providers:            Lollie Sails, MD Referring MD:         Ane Payment, MD (Referring MD) Medicines:            Monitored Anesthesia Care Complications:        No immediate complications. Procedure:            Pre-Anesthesia Assessment:                       - ASA Grade Assessment: II - A patient with mild                        systemic disease.                       After obtaining informed consent, the colonoscope was                        passed under direct vision. Throughout the procedure,                        the patient's blood pressure, pulse, and oxygen                        saturations were monitored continuously. The                        Colonoscope was introduced through the anus and                        advanced to the the cecum, identified by appendiceal                        orifice and ileocecal valve. The colonoscopy was                        performed without difficulty. The patient tolerated the                        procedure well. The quality of the bowel preparation                        was good. Findings:      The colon (entire examined portion) appeared normal.      The retroflexed view of the distal rectum and anal verge was normal and       showed no anal or rectal abnormalities.      The digital rectal exam was normal. Impression:           - The entire examined colon is normal.                       - The distal rectum and anal verge are normal on  retroflexion view.                       - No specimens collected. Recommendation:       -  Discharge patient to home. Procedure Code(s):    --- Professional ---                       [id number], Colonoscopy, flexible; diagnostic, including                        collection of specimen(s) by brushing or washing, when                        performed (separate procedure) Diagnosis Code(s):    --- Professional ---                       Z12.11, Encounter for screening for malignant neoplasm                        of colon CPT copyright 2016 [employer]. All rights reserved. The codes documented in this report are preliminary and upon coder review may  be revised to meet current compliance requirements. Lollie Sails, MD 07/04/2016 10:28:38 AM This report has been signed electronically. Number of Addenda: 0 Note Initiated On: 07/04/2016 9:52 AM Scope Withdrawal Time: 0 hours 6 minutes 0 seconds  Total Procedure Duration: 0 hours 11 minutes 3 seconds       [facility]

## 2016-07-08 ENCOUNTER — Encounter: Payer: Self-pay | Admitting: Gastroenterology

## 2016-07-08 DIAGNOSIS — R778 Other specified abnormalities of plasma proteins: Secondary | ICD-10-CM | POA: Diagnosis not present

## 2016-07-08 DIAGNOSIS — E6609 Other obesity due to excess calories: Secondary | ICD-10-CM | POA: Diagnosis not present

## 2016-07-08 DIAGNOSIS — M79621 Pain in right upper arm: Secondary | ICD-10-CM | POA: Diagnosis not present

## 2016-07-08 DIAGNOSIS — E7800 Pure hypercholesterolemia, unspecified: Secondary | ICD-10-CM | POA: Diagnosis not present

## 2016-07-08 DIAGNOSIS — D473 Essential (hemorrhagic) thrombocythemia: Secondary | ICD-10-CM | POA: Diagnosis not present

## 2016-07-08 DIAGNOSIS — M25552 Pain in left hip: Secondary | ICD-10-CM | POA: Diagnosis not present

## 2016-07-08 DIAGNOSIS — R5383 Other fatigue: Secondary | ICD-10-CM | POA: Diagnosis not present

## 2016-07-08 DIAGNOSIS — G8929 Other chronic pain: Secondary | ICD-10-CM | POA: Diagnosis not present

## 2016-07-08 DIAGNOSIS — E559 Vitamin D deficiency, unspecified: Secondary | ICD-10-CM | POA: Diagnosis not present

## 2016-07-08 LAB — SURGICAL PATHOLOGY

## 2016-07-09 DIAGNOSIS — R778 Other specified abnormalities of plasma proteins: Secondary | ICD-10-CM | POA: Insufficient documentation

## 2016-07-30 DIAGNOSIS — M7552 Bursitis of left shoulder: Secondary | ICD-10-CM | POA: Insufficient documentation

## 2016-07-30 DIAGNOSIS — M79622 Pain in left upper arm: Secondary | ICD-10-CM | POA: Diagnosis not present

## 2016-07-30 DIAGNOSIS — G8929 Other chronic pain: Secondary | ICD-10-CM | POA: Diagnosis not present

## 2016-07-30 DIAGNOSIS — M25512 Pain in left shoulder: Secondary | ICD-10-CM | POA: Diagnosis not present

## 2016-09-18 DIAGNOSIS — E6609 Other obesity due to excess calories: Secondary | ICD-10-CM | POA: Diagnosis not present

## 2016-09-18 DIAGNOSIS — N6320 Unspecified lump in the left breast, unspecified quadrant: Secondary | ICD-10-CM | POA: Diagnosis not present

## 2016-09-18 DIAGNOSIS — M79621 Pain in right upper arm: Secondary | ICD-10-CM | POA: Diagnosis not present

## 2016-09-18 DIAGNOSIS — R69 Illness, unspecified: Secondary | ICD-10-CM | POA: Diagnosis not present

## 2016-09-18 DIAGNOSIS — R778 Other specified abnormalities of plasma proteins: Secondary | ICD-10-CM | POA: Diagnosis not present

## 2016-09-18 DIAGNOSIS — R921 Mammographic calcification found on diagnostic imaging of breast: Secondary | ICD-10-CM | POA: Diagnosis not present

## 2016-09-18 DIAGNOSIS — D473 Essential (hemorrhagic) thrombocythemia: Secondary | ICD-10-CM | POA: Diagnosis not present

## 2016-09-23 ENCOUNTER — Other Ambulatory Visit: Payer: Self-pay | Admitting: Pediatrics

## 2016-09-23 DIAGNOSIS — N6320 Unspecified lump in the left breast, unspecified quadrant: Secondary | ICD-10-CM

## 2016-09-23 DIAGNOSIS — R921 Mammographic calcification found on diagnostic imaging of breast: Secondary | ICD-10-CM

## 2016-10-13 DIAGNOSIS — K210 Gastro-esophageal reflux disease with esophagitis: Secondary | ICD-10-CM | POA: Diagnosis not present

## 2016-10-13 DIAGNOSIS — Z8619 Personal history of other infectious and parasitic diseases: Secondary | ICD-10-CM | POA: Diagnosis not present

## 2016-10-13 DIAGNOSIS — R1319 Other dysphagia: Secondary | ICD-10-CM | POA: Diagnosis not present

## 2016-10-13 DIAGNOSIS — R1013 Epigastric pain: Secondary | ICD-10-CM | POA: Diagnosis not present

## 2016-10-13 DIAGNOSIS — K219 Gastro-esophageal reflux disease without esophagitis: Secondary | ICD-10-CM | POA: Diagnosis not present

## 2016-10-13 DIAGNOSIS — R1310 Dysphagia, unspecified: Secondary | ICD-10-CM | POA: Diagnosis not present

## 2016-10-23 ENCOUNTER — Ambulatory Visit
Admission: RE | Admit: 2016-10-23 | Discharge: 2016-10-23 | Disposition: A | Discharge status: Home / Self Care | Payer: Medicare HMO | Source: Ambulatory Visit | Attending: Pediatrics | Admitting: Pediatrics

## 2016-10-23 DIAGNOSIS — R921 Mammographic calcification found on diagnostic imaging of breast: Secondary | ICD-10-CM | POA: Diagnosis not present

## 2016-10-23 DIAGNOSIS — N6320 Unspecified lump in the left breast, unspecified quadrant: Secondary | ICD-10-CM

## 2016-10-23 DIAGNOSIS — R928 Other abnormal and inconclusive findings on diagnostic imaging of breast: Secondary | ICD-10-CM | POA: Diagnosis not present

## 2016-11-05 ENCOUNTER — Inpatient Hospital Stay: Payer: Medicare HMO

## 2016-11-05 ENCOUNTER — Inpatient Hospital Stay: Payer: Medicare HMO | Attending: Hematology and Oncology | Admitting: Hematology and Oncology

## 2016-11-05 VITALS — BP 144/82 | HR 73 | Temp 97.3°F | Resp 18 | Ht 60.0 in | Wt 187.8 lb

## 2016-11-05 DIAGNOSIS — D473 Essential (hemorrhagic) thrombocythemia: Secondary | ICD-10-CM | POA: Diagnosis not present

## 2016-11-05 DIAGNOSIS — Z79899 Other long term (current) drug therapy: Secondary | ICD-10-CM | POA: Insufficient documentation

## 2016-11-05 DIAGNOSIS — E669 Obesity, unspecified: Secondary | ICD-10-CM | POA: Diagnosis not present

## 2016-11-05 DIAGNOSIS — K449 Diaphragmatic hernia without obstruction or gangrene: Secondary | ICD-10-CM | POA: Insufficient documentation

## 2016-11-05 DIAGNOSIS — E7800 Pure hypercholesterolemia, unspecified: Secondary | ICD-10-CM | POA: Insufficient documentation

## 2016-11-05 DIAGNOSIS — G2581 Restless legs syndrome: Secondary | ICD-10-CM | POA: Diagnosis not present

## 2016-11-05 DIAGNOSIS — R0602 Shortness of breath: Secondary | ICD-10-CM | POA: Insufficient documentation

## 2016-11-05 DIAGNOSIS — Z8673 Personal history of transient ischemic attack (TIA), and cerebral infarction without residual deficits: Secondary | ICD-10-CM | POA: Insufficient documentation

## 2016-11-05 DIAGNOSIS — Z7982 Long term (current) use of aspirin: Secondary | ICD-10-CM | POA: Diagnosis not present

## 2016-11-05 DIAGNOSIS — D75839 Thrombocytosis, unspecified: Secondary | ICD-10-CM

## 2016-11-05 DIAGNOSIS — E559 Vitamin D deficiency, unspecified: Secondary | ICD-10-CM | POA: Diagnosis not present

## 2016-11-05 DIAGNOSIS — F17290 Nicotine dependence, other tobacco product, uncomplicated: Secondary | ICD-10-CM | POA: Diagnosis not present

## 2016-11-05 DIAGNOSIS — D751 Secondary polycythemia: Secondary | ICD-10-CM | POA: Diagnosis not present

## 2016-11-05 DIAGNOSIS — R69 Illness, unspecified: Secondary | ICD-10-CM | POA: Diagnosis not present

## 2016-11-05 LAB — CBC WITH DIFFERENTIAL/PLATELET
Basophils Absolute: 0.1 10*3/uL (ref 0–0.1)
Basophils Relative: 1 %
Eosinophils Absolute: 0 10*3/uL (ref 0–0.7)
Eosinophils Relative: 0 %
HCT: 43.7 % (ref 35.0–47.0)
Hemoglobin: 14.3 g/dL (ref 12.0–16.0)
Lymphocytes Relative: 26 %
Lymphs Abs: 2.5 10*3/uL (ref 1.0–3.6)
MCH: 28.4 pg (ref 26.0–34.0)
MCHC: 32.9 g/dL (ref 32.0–36.0)
MCV: 86.5 fL (ref 80.0–100.0)
Monocytes Absolute: 0.6 10*3/uL (ref 0.2–0.9)
Monocytes Relative: 6 %
Neutro Abs: 6.6 10*3/uL — ABNORMAL HIGH (ref 1.4–6.5)
Neutrophils Relative %: 67 %
Platelets: 583 10*3/uL — ABNORMAL HIGH (ref 150–440)
RBC: 5.05 MIL/uL (ref 3.80–5.20)
RDW: 15.9 % — ABNORMAL HIGH (ref 11.5–14.5)
WBC: 9.8 10*3/uL (ref 3.6–11.0)

## 2016-11-05 LAB — COMPREHENSIVE METABOLIC PANEL
ALT: 17 U/L (ref 14–54)
AST: 19 U/L (ref 15–41)
Albumin: 3.7 g/dL (ref 3.5–5.0)
Alkaline Phosphatase: 43 U/L (ref 38–126)
Anion gap: 6 (ref 5–15)
BUN: 12 mg/dL (ref 6–20)
CO2: 28 mmol/L (ref 22–32)
Calcium: 9.3 mg/dL (ref 8.9–10.3)
Chloride: 103 mmol/L (ref 101–111)
Creatinine, Ser: 0.96 mg/dL (ref 0.44–1.00)
GFR calc Af Amer: >60 mL/min (ref >60)
GFR calc non Af Amer: 58 mL/min — ABNORMAL LOW (ref >60)
Glucose, Bld: 95 mg/dL (ref 65–99)
Potassium: 3.9 mmol/L (ref 3.5–5.1)
Sodium: 137 mmol/L (ref 135–145)
Total Bilirubin: 0.6 mg/dL (ref 0.3–1.2)
Total Protein: 8 g/dL (ref 6.5–8.1)

## 2016-11-05 NOTE — Progress Notes (Signed)
[facility] day:  11/05/2016  Chief Complaint: Molly Perez is a 71 y.o. female with erythrocytosis and thrombocytosis who is referred in consultation by Dr. Barbaraann Boys for assessment and management.  HPI:  The patient has had an elevated platelet count since 03/24/2016.  Platelet count has ranged between 512,000 - 622,000.  Platelet count has improved with time.  WBC has been mildly elevated from 10,400 - 10,600.  Hematocrit was normal until 09/18/2016.  At that time, hematocrit was 50.7 with a hemoglobin of 16.6.   She underwent EGD and colonoscopy on 07/04/2016.  EGD revealed reflux esophagitis, H pylori gastritis, and a small hiatal hernia.  There was no Barrett's esophagus, dysplasia or malignancy.  H pylori was treated with metronidazole, clarithromycin, and omeprazole.  Colonoscopy was normal.  Symptomatically, she states that she "didn't know I was having a problem".  She denies any pain.  She notes reflux and an "upset stomach".  She had a slight cold last week.  She notes a little shortness of breath with walking.  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  She pulled a ligament in her right knee after jumping off of a truck.  She denies any family history of blood disorders.  Her grand-daughter has lupus and is s/p a CVA.   Past Medical History:  Diagnosis Date  . Cataract cortical, senile   . GERD (gastroesophageal reflux disease)   . Hypercholesterolemia   . Obesity   . Prediabetes   . Vitamin D deficiency     Past Surgical History:  Procedure Laterality Date  . ABDOMINAL HYSTERECTOMY    . COLONOSCOPY    . COLONOSCOPY WITH PROPOFOL N/A 07/04/2016   Procedure: COLONOSCOPY WITH PROPOFOL;  Surgeon: Lollie Sails, MD;  Location: [facility] ENDOSCOPY;  Service: Endoscopy;  Laterality: N/A;  . ESOPHAGOGASTRODUODENOSCOPY (EGD) WITH PROPOFOL N/A 07/04/2016   Procedure: ESOPHAGOGASTRODUODENOSCOPY (EGD) WITH PROPOFOL;  Surgeon: Lollie Sails, MD;  Location: [facility] ENDOSCOPY;  Service: Endoscopy;  Laterality: N/A;    Family History  Problem Relation Age of Onset  . Alzheimer's disease Father   . Congestive Heart Failure Mother   . Diabetes Mother   . Breast cancer Neg Hx     Social History:  reports that she has never smoked. Her smokeless tobacco use includes Snuff. She reports that she does not drink alcohol or use drugs.  She lives in [place].  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  She retired at age 49.  She worked for [employer].  She denies any exposure to radiation or toxins.  The patient is alone today.  Allergies:  Allergies  Allergen Reactions  . Amoxicillin Other (See Comments)  . Lipitor [Atorvastatin] Other (See Comments)    Weakness in legs Weakness in legs  . Penicillins Other (See Comments)    Pt states medication upsets her stomach    Current Medications: Current Outpatient Prescriptions  Medication Sig Dispense Refill  . acetaminophen (TYLENOL) 500 MG tablet Take by mouth.    Marland Kitchen aspirin EC 81 MG tablet Take 81 mg by mouth daily.    . Cholecalciferol (VITAMIN D3) 5000 UNITS CAPS Take 1 capsule by mouth daily.    . pantoprazole (PROTONIX) 40 MG tablet Take 40 mg by mouth daily.    . pravastatin (PRAVACHOL) 80 MG tablet Take by mouth.    Marland Kitchen  vitamin B-12 (CYANOCOBALAMIN) 1000 MCG tablet Take 1 tablet (1,000 mcg total) by mouth daily. 30 tablet 0  . ibuprofen (ADVIL,MOTRIN) 200 MG tablet Take 2 tablets by mouth every 4 (four) hours as needed.    . mupirocin ointment (BACTROBAN) 2 % Apply 1 application topically 3 (three) times daily. (Patient not taking: Reported on 11/05/2016) 22 g 0   No current facility-administered medications for this visit.     Review of Systems:  GENERAL:  Feels good.  No fevers.  Sweats for years.  No weight loss. PERFORMANCE STATUS (ECOG):  1 HEENT:   Cataracts.  No visual changes, runny nose, sore throat, mouth sores or tenderness. Lungs: Shortness of breath with walking.  No cough.  No hemoptysis.  No sleep apnea. Cardiac:  No chest pain, palpitations, orthopnea, or PND. GI:  Reflux and upset stomach.  Medication causes constipation (uses Miralax).  No nausea, vomiting, diarrhea, melena or hematochezia. GU:  No urgency, frequency, dysuria, or hematuria. Musculoskeletal:  Upper arm discomfort felt secondary to lifting.  Right knee issues (see HPI).  Bone is toe bothersome.  No muscle tenderness. Extremities:  No pain or swelling. Skin:  No rashes or skin changes. Neuro:  No headache, numbness or weakness, balance or coordination issues. Endocrine:  Prediabetic.  No thyroid issues, hot flashes or night sweats. Psych:  No mood changes, depression or anxiety. Pain:  No focal pain. Review of systems:  All other systems reviewed and found to be negative.  Physical Exam: Blood pressure (!) 144/82, pulse 73, temperature 97.3 F (36.3 C), temperature source Tympanic, resp. rate 18, height 5' (1.524 m), weight 187 lb 13.3 oz (85.2 kg). GENERAL:  Well developed, well nourished, woman sitting comfortably in the exam room in no acute distress. MENTAL STATUS:  Alert and oriented to person, place and time. HEAD:  Long brown hair pulled back.  Normocephalic, atraumatic, face symmetric, no Cushingoid features. EYES:  Glasses.  Brown eyes.  Pupils equal round and reactive to light and accomodation.  No conjunctivitis or scleral icterus. ENT:  Oropharynx clear without lesion.  Poor dentition.  Tongue normal.  Mucous membranes moist.  RESPIRATORY:  Clear to auscultation without rales, wheezes or rhonchi. CARDIOVASCULAR:  Regular rate and rhythm without murmur, rub or gallop. ABDOMEN:  Soft, non-tender, with active bowel sounds, and no hepatosplenomegaly.  No masses. SKIN:  No rashes, ulcers or lesions. EXTREMITIES: No edema, no skin discoloration or  tenderness.  No palpable cords. LYMPH NODES: No palpable cervical, supraclavicular, axillary or inguinal adenopathy  NEUROLOGICAL: Unremarkable. PSYCH:  Appropriate.   No visits with results within 3 Day(s) from this visit.  Latest known visit with results is:  Admission on 07/04/2016, Discharged on 07/04/2016  Component Date Value Ref Range Status  . SURGICAL PATHOLOGY 07/08/2016    Final                   Value:Surgical Pathology CASE: [id number] PATIENT: Molly Perez Surgical Pathology Report     SPECIMEN SUBMITTED: A. Stomach, antrum; cbx B. Stomach, body; cbx C. GEJ; cbx  CLINICAL HISTORY: None provided  PRE-OPERATIVE DIAGNOSIS: Screening, GERD  POST-OPERATIVE DIAGNOSIS: Small sliding hiatal hernia, mild diffuse gastritis, normal colon     DIAGNOSIS: A. STOMACH, ANTRUM; COLD BIOPSY: - MODERATE CHRONIC ACTIVE HELICOBACTER ASSOCIATED GASTRITIS. - NEGATIVE FOR DYSPLASIA AND MALIGNANCY.  B. STOMACH, BODY; COLD BIOPSY: - MODERATE CHRONIC ACTIVE HELICOBACTER ASSOCIATED GASTRITIS. - NEGATIVE FOR DYSPLASIA AND MALIGNANCY.  C. GEJ; COLD BIOPSY: - MODERATE CHRONIC ACTIVE HELICOBACTER ASSOCIATED  GASTRITIS. - SQUAMOUS MUCOSA IS NOT PRESENT FOR EVALUATION. - NEGATIVE FOR DYSPLASIA AND MALIGNANCY.   GROSS DESCRIPTION:  A. Labeled: C BX antrum  Tissue fragment(s): 1  Size: 0.4 cm  Description: Tan  Entirely submitted in 1 cassette(s).   B. Labeled                         : C BX body  Tissue fragment(s): 2  Size: 0.2 and 0.4 cm  Description: tan fragments  Entirely submitted in one cassette(s).  C. Labeled: C BX GEJ  Tissue fragment(s): multiple  Size: aggregate, 0.4 x 0.3 x 0.1 cm  Description: tan fragments  Entirely submitted in 1 cassette(s).  Final Diagnosis performed by Quay Burow, MD.  Electronically signed 07/08/2016 11:58:33AM    The electronic signature indicates that the named Attending Pathologist has evaluated the  specimen  Technical component performed at [facility], [address], [place], [place] [address] Lab: [phone] Dir: Darrick Penna. Evette Doffing, MD  Professional component performed at [facility], [facility], [place], [place], [place] [address] Lab: [phone] Dir: Dellia Nims. Reuel Derby, MD      Assessment:  Molly Perez is a 71 y.o. female with leukocytosis, thromobocytosis, and erythrocytosis.   Platelet count has ranged between 512,000 - 622,000 since 03/24/2016.  WBC has been mildly elevated (10,400 - 10,600).  Hematocrit was normal until 09/18/2016.  At that time, hematocrit was 50.7 with a hemoglobin of 16.6.  EGD on 07/04/2016 revealed reflux esophagitis, H pylori gastritis, and a small hiatal hernia.  H pylori was treated with metronidazole, clarithromycin, and omeprazole.  Colonoscopy on 07/04/2016 was normal.  She denies any recurrent infections.  She denies any tobacco use or sleep apnea.  She has no history of anemia.  Ferritin was 31 on 09/18/2016.  Symptomatically, she has reflux.  She has some upper arm and right knee discomfort.  Exam is unremarkable.  Plan: 1.  Discuss mild leukocytosis, thrombocytosis, and erythrocytosis.  No apparent secondary cause.  Discuss possible myeloproliferative disorder (polycythemia rubra vera).  Discuss baseline labs. 2.  Labs today:  CBC with diff, CMP, ferritin, iron studies, epo level, carboxyhemoglobin, JAK2 with reflex. 3.  RTC in 2 weeks for MD assess and review of testing.   Lequita Asal, MD  11/05/2016, 2:01 PM

## 2016-11-05 NOTE — Progress Notes (Signed)
Patient here today as new evaluation regarding polycythemia/thrombocytosis.  Referred nu Dr. Janene Harvey.

## 2016-11-06 LAB — IRON AND TIBC
Iron: 106 ug/dL (ref 28–170)
Saturation Ratios: 25 % (ref 10.4–31.8)
TIBC: 420 ug/dL (ref 250–450)
UIBC: 314 ug/dL

## 2016-11-06 LAB — FERRITIN: Ferritin: 27 ng/mL (ref 11–307)

## 2016-11-07 LAB — ERYTHROPOIETIN: Erythropoietin: 6.5 m[IU]/mL (ref 2.6–18.5)

## 2016-11-11 LAB — JAK2  V617F QUAL. WITH REFLEX TO EXON 12: Reflex:: 15

## 2016-11-19 ENCOUNTER — Ambulatory Visit: Payer: Medicare HMO | Admitting: Hematology and Oncology

## 2016-11-26 ENCOUNTER — Encounter: Payer: Self-pay | Admitting: Hematology and Oncology

## 2016-11-26 ENCOUNTER — Inpatient Hospital Stay (HOSPITAL_BASED_OUTPATIENT_CLINIC_OR_DEPARTMENT_OTHER): Payer: Medicare HMO | Admitting: Hematology and Oncology

## 2016-11-26 VITALS — BP 148/85 | HR 71 | Temp 95.0°F | Resp 18 | Wt 191.1 lb

## 2016-11-26 DIAGNOSIS — E669 Obesity, unspecified: Secondary | ICD-10-CM

## 2016-11-26 DIAGNOSIS — G2581 Restless legs syndrome: Secondary | ICD-10-CM

## 2016-11-26 DIAGNOSIS — E559 Vitamin D deficiency, unspecified: Secondary | ICD-10-CM

## 2016-11-26 DIAGNOSIS — Z1589 Genetic susceptibility to other disease: Secondary | ICD-10-CM | POA: Insufficient documentation

## 2016-11-26 DIAGNOSIS — Z79899 Other long term (current) drug therapy: Secondary | ICD-10-CM

## 2016-11-26 DIAGNOSIS — D473 Essential (hemorrhagic) thrombocythemia: Secondary | ICD-10-CM | POA: Diagnosis not present

## 2016-11-26 DIAGNOSIS — E7800 Pure hypercholesterolemia, unspecified: Secondary | ICD-10-CM | POA: Diagnosis not present

## 2016-11-26 DIAGNOSIS — K449 Diaphragmatic hernia without obstruction or gangrene: Secondary | ICD-10-CM | POA: Diagnosis not present

## 2016-11-26 DIAGNOSIS — D75839 Thrombocytosis, unspecified: Secondary | ICD-10-CM

## 2016-11-26 DIAGNOSIS — Z8673 Personal history of transient ischemic attack (TIA), and cerebral infarction without residual deficits: Secondary | ICD-10-CM

## 2016-11-26 DIAGNOSIS — R69 Illness, unspecified: Secondary | ICD-10-CM | POA: Diagnosis not present

## 2016-11-26 DIAGNOSIS — R0602 Shortness of breath: Secondary | ICD-10-CM | POA: Diagnosis not present

## 2016-11-26 DIAGNOSIS — Z7982 Long term (current) use of aspirin: Secondary | ICD-10-CM

## 2016-11-26 DIAGNOSIS — F17290 Nicotine dependence, other tobacco product, uncomplicated: Secondary | ICD-10-CM | POA: Diagnosis not present

## 2016-11-26 DIAGNOSIS — D751 Secondary polycythemia: Secondary | ICD-10-CM | POA: Diagnosis not present

## 2016-11-26 DIAGNOSIS — F17210 Nicotine dependence, cigarettes, uncomplicated: Secondary | ICD-10-CM

## 2016-11-26 DIAGNOSIS — M7989 Other specified soft tissue disorders: Secondary | ICD-10-CM

## 2016-11-26 NOTE — Progress Notes (Signed)
Patient offers no concerns today. 

## 2016-11-26 NOTE — Progress Notes (Signed)
[facility] day:  11/26/2016  Chief Complaint: Davielle Lingelbach is a 71 y.o. female with erythrocytosis and thrombocytosis who is seen for review of work-up and discussion regarding direction of therapy.  HPI:  The patient was last seen in the hematology clinic on 11/05/2016.  At that time, she was seen for initial consultation regarding mild polycythemia.  There were no apparent secondary causes.  She underwent a work-up.  Labs on 11/05/2016 revealed a hematocrit of 43.7, hemoglobin 14.3, platelets 583,000, WBC 9800 with an ANC of 6600.  Comprehensive metabolic panel was normal.  Ferritin was 27.  Iron saturation was 25% with a TIBC of 420.  Erythropoietin level was 6.5.  JAK2 testing was positive for V617F.  Symptomatically, she notes having "fits with my left leg".  She notes pain laterally and behind her knee.  She is unsure if she has any swelling.  She has restless legs.   Past Medical History:  Diagnosis Date  . Cataract cortical, senile   . GERD (gastroesophageal reflux disease)   . Hypercholesterolemia   . Obesity   . Prediabetes   . Vitamin D deficiency     Past Surgical History:  Procedure Laterality Date  . ABDOMINAL HYSTERECTOMY    . COLONOSCOPY    . COLONOSCOPY WITH PROPOFOL N/A 07/04/2016   Procedure: COLONOSCOPY WITH PROPOFOL;  Surgeon: Lollie Sails, MD;  Location[facility] ENDOSCOPY;  Service: Endoscopy;  Laterality: N/A;  . ESOPHAGOGASTRODUODENOSCOPY (EGD) WITH PROPOFOL N/A 07/04/2016   Procedure: ESOPHAGOGASTRODUODENOSCOPY (EGD) WITH PROPOFOL;  Surgeon: Lollie Sails, MD;  Location: [facility] ENDOSCOPY;  Service: Endoscopy;  Laterality: N/A;    Family History  Problem Relation Age of Onset  . Alzheimer's disease Father   . Congestive Heart Failure Mother   . Diabetes Mother   . Multiple sclerosis Brother   . Lupus Grandchild   . Stroke Grandchild   . Breast cancer Neg Hx     Social History:  reports that she has  never smoked. Her smokeless tobacco use includes Snuff. She reports that she does not drink alcohol or use drugs.  She lives in [place].  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  She retired at age 37.  She worked for [employer].  She denies any exposure to radiation or toxins.  She is concerned about finannces and paying for new medications.  The patient is alone today.  Allergies:  Allergies  Allergen Reactions  . Amoxicillin Other (See Comments)  . Lipitor [Atorvastatin] Other (See Comments)    Weakness in legs Weakness in legs  . Penicillins Other (See Comments)    Pt states medication upsets her stomach    Current Medications: Current Outpatient Prescriptions  Medication Sig Dispense Refill  . acetaminophen (TYLENOL) 500 MG tablet Take by mouth.    Marland Kitchen aspirin EC 81 MG tablet Take 81 mg by mouth daily.    . Cholecalciferol (VITAMIN D3) 5000 UNITS CAPS Take 1 capsule by mouth daily.    Marland Kitchen ibuprofen (ADVIL,MOTRIN) 200 MG tablet Take 2 tablets by mouth every 4 (four) hours as needed.    . mupirocin ointment (BACTROBAN) 2 % Apply 1 application topically 3 (three) times daily. 22 g 0  . pantoprazole (PROTONIX) 40 MG tablet Take 40 mg by mouth daily.    . pravastatin (PRAVACHOL) 80 MG tablet Take by mouth.    . vitamin B-12 (CYANOCOBALAMIN) 1000 MCG tablet Take 1 tablet (1,000 mcg total)  by mouth daily. 30 tablet 0   No current facility-administered medications for this visit.     Review of Systems:  GENERAL:  Feels "ok".  No fevers.  Sweats for years.  No weight loss. PERFORMANCE STATUS (ECOG):  1 HEENT:  Cataracts.  No visual changes, runny nose, sore throat, mouth sores or tenderness. Lungs: Shortness of breath with walking.  No cough.  No hemoptysis.  No sleep apnea. Cardiac:  No chest pain, palpitations, orthopnea, or PND. GI:  Reflux and upset stomach (no change). Constipation managed with Miralax.  No nausea, vomiting, diarrhea, melena or  hematochezia. GU:  No urgency, frequency, dysuria, or hematuria. Musculoskeletal:  Right knee issues following jump off a truck.  Left leg discomfort (unknown if swollen).  No muscle tenderness. Extremities:  Restless legs.  Left leg discomfort without trauma.  No apparent swelling. Skin:  No rashes or skin changes. Neuro:  No headache, numbness or weakness, balance or coordination issues. Endocrine:  Prediabetic.  No thyroid issues, hot flashes or night sweats. Psych:  No mood changes, depression or anxiety. Pain:  Left leg discomfort. Review of systems:  All other systems reviewed and found to be negative.  Physical Exam: Blood pressure (!) 148/85, pulse 71, temperature (!) 95 F (35 C), temperature source Tympanic, resp. rate 18, weight 191 lb 2.2 oz (86.7 kg). GENERAL:  Well developed, well nourished, woman sitting comfortably in the exam room in no acute distress. MENTAL STATUS:  Alert and oriented to person, place and time. HEAD:  Long brown hair pulled back.  Normocephalic, atraumatic, face symmetric, no Cushingoid features. EYES:  Glasses.  Brown eyes.  No conjunctivitis or scleral icterus. SKIN:  No rashes or skin changes. EXTREMITIES: Circumference 10 cm above knee (R:  57 cm; L: 59 cm) and 10 cm below knee (R: 43.5 cm; L: 44 cm).  No skin discoloration or tenderness.  No palpable cords. NEUROLOGICAL: Unremarkable. PSYCH:  Appropriate.   No visits with results within 3 Day(s) from this visit.  Latest known visit with results is:  Office Visit on 11/05/2016  Component Date Value Ref Range Status  . WBC 11/05/2016 9.8  3.6 - 11.0 K/uL Final  . RBC 11/05/2016 5.05  3.80 - 5.20 MIL/uL Final  . Hemoglobin 11/05/2016 14.3  12.0 - 16.0 g/dL Final  . HCT 11/05/2016 43.7  35.0 - 47.0 % Final  . MCV 11/05/2016 86.5  80.0 - 100.0 fL Final  . MCH 11/05/2016 28.4  26.0 - 34.0 pg Final  . MCHC 11/05/2016 32.9  32.0 - 36.0 g/dL Final  . RDW 11/05/2016 15.9* 11.5 - 14.5 % Final  .  Platelets 11/05/2016 583* 150 - 440 K/uL Final  . Neutrophils Relative % 11/05/2016 67  % Final  . Neutro Abs 11/05/2016 6.6* 1.4 - 6.5 K/uL Final  . Lymphocytes Relative 11/05/2016 26  % Final  . Lymphs Abs 11/05/2016 2.5  1.0 - 3.6 K/uL Final  . Monocytes Relative 11/05/2016 6  % Final  . Monocytes Absolute 11/05/2016 0.6  0.2 - 0.9 K/uL Final  . Eosinophils Relative 11/05/2016 0  % Final  . Eosinophils Absolute 11/05/2016 0.0  0 - 0.7 K/uL Final  . Basophils Relative 11/05/2016 1  % Final  . Basophils Absolute 11/05/2016 0.1  0 - 0.1 K/uL Final  . Sodium 11/05/2016 137  135 - 145 mmol/L Final  . Potassium 11/05/2016 3.9  3.5 - 5.1 mmol/L Final  . Chloride 11/05/2016 103  101 - 111 mmol/L Final  .  CO2 11/05/2016 28  22 - 32 mmol/L Final  . Glucose, Bld 11/05/2016 95  65 - 99 mg/dL Final  . BUN 11/05/2016 12  6 - 20 mg/dL Final  . Creatinine, Ser 11/05/2016 0.96  0.44 - 1.00 mg/dL Final  . Calcium 11/05/2016 9.3  8.9 - 10.3 mg/dL Final  . Total Protein 11/05/2016 8.0  6.5 - 8.1 g/dL Final  . Albumin 11/05/2016 3.7  3.5 - 5.0 g/dL Final  . AST 11/05/2016 19  15 - 41 U/L Final  . ALT 11/05/2016 17  14 - 54 U/L Final  . Alkaline Phosphatase 11/05/2016 43  38 - 126 U/L Final  . Total Bilirubin 11/05/2016 0.6  0.3 - 1.2 mg/dL Final  . GFR calc non Af Amer 11/05/2016 58* >60 mL/min Final  . GFR calc Af Amer 11/05/2016 >60  >60 mL/min Final   Comment: (NOTE) The eGFR has been calculated using the CKD EPI equation. This calculation has not been validated in all clinical situations. eGFR's persistently <60 mL/min signify possible Chronic Kidney Disease.   . Anion gap 11/05/2016 6  5 - 15 Final  . Ferritin 11/05/2016 27  11 - 307 ng/mL Final  . Iron 11/05/2016 106  28 - 170 ug/dL Final  . TIBC 11/05/2016 420  250 - 450 ug/dL Final  . Saturation Ratios 11/05/2016 25  10.4 - 31.8 % Final  . UIBC 11/05/2016 314  ug/dL Final  . Erythropoietin 11/05/2016 6.5  2.6 - 18.5 mIU/mL Final    Comment: (NOTE) Siemens Immulite 2000 Immunochemiluminometric assay [facility]) Performed At: [facility] [place], [place] [id number] Lindon Romp MD WU:[id number]   . JAK2 GenotypR 11/05/2016 Comment*  Final   Comment: (NOTE) Result:  POSITIVE for the detection of the V617F mutation. Interpretation:  The assay detected the presence of a G to T nucleotide change encoding the V617F mutation within JAK2. Interpretation of this result should be made in the context of other clinical, morphologic, and cytogenetic findings.   Marland Kitchen BACKGROUND: 11/05/2016 Comment   Final   Comment: (NOTE) JAK2 is a cytoplasmic tyrosine kinase with a key role in signal transduction from multiple hematopoietic growth factor receptors. A point mutation within exon 14 of the JAK2 gene (N5621H) encoding a valine to phenylalanine substitution at position 617 of the JAK2 protein (V617F) has been identified in most patients with polycythemia vera, and in about half of those with either essential thrombocythemia or idiopathic myelofibrosis. The V617F has also been detected, although infrequently, in other myeloid disorders such as chronic myelomonocytic leukemia and chronic neutrophilic luekemia. V617F is an acquired mutation that alters a highly conserved valine present in the negative regulatory JH2 domain of the JAK2 protein and is predicted to dysregulate kinase activity. Methodology: Total genomic DNA was extracted and subjected to TaqMan real-time PCR amplification/detection. Two amplification products per sample were monitored by real-time PCR using primers/probes s                          pecific to JAK2 wild type (WT) and JAK2 mutant V617F. The ABI7900 Absolute Quantitation software will compare the patient specimen valuse to the standard curves and generate percent values for wild type and mutant type. In vitro studies have indicated that this assay has an analytical sensitivity of  1%. References: Baxter EJ, [place] Phineas Real, et al. Acquired mutation of the tyrosine kinase JAK2 in human myeloproliferative disorders. Lancet. 2005 Mar 19-25; 365(9464):1054-1061. Jeneen Rinks  Berle Mull Couedic JP. A unique clonal JAK2 mutation leading to constitutive signaling causes polycythaemia vera. Nature. 2005 Apr 28; 434(7037):1144-1148. Kralovics R, Passamonti F, Buser AS, et al. A gain-of-function mutation of JAK2 in myeloproliferative disorders. N Engl J Med. 2005 Apr 28; 352(17):1779-1790.   . Director Review, JAK2 11/05/2016 Comment   Final   Comment: (NOTE) Katina Degree, MD, PhD Director, [facility] [place], [place] [address] [phone]   . Reflex: 11/05/2016 15 Mutation Analysis is not indicated.   Corrected   Comment: Comment Reflex to JAK2 Exon 12   . Extraction 11/05/2016 Completed   Corrected   Comment: (NOTE) Performed At: [facility] [address] [place], [place] [id number] Nechama Guard MD [id number] Performed At: [facility] [address] [place], [place] [id number] Nechama Guard MD [id number]     Assessment:  Janit Cutter is a 71 y.o. 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  She has a history of mild leukocytosis, thromobocytosis, and erythrocytosis.   Platelet count has ranged between 512,000 - 622,000 since 03/24/2016.  WBC has been mildly elevated (10,400 - 10,600).  Hematocrit was normal until 09/18/2016.  At that time, hematocrit was 50.7 with a hemoglobin of 16.6.  Work-up on 11/05/2016 revealed a hematocrit of 43.7, hemoglobin 14.3, platelets 583,00, WBC 9800 with an ANC of 6600.  Normal studies included:  Ferritin, iron studies, and erythropoietin level.  JAK2 was positive for V617F.  EGD on 07/04/2016 revealed reflux esophagitis, H pylori gastritis, and a small hiatal  hernia.  H pylori was treated with metronidazole, clarithromycin, and omeprazole.  Colonoscopy on 07/04/2016 was normal.  Symptomatically, she notes new left leg discomfort.  Exam reveals left > right proximal lower extremity circumference.  Plan: 1.  Discuss work-up revealing a myeloproliferative disorder.  Hematocrit and WBC are normal.  Platelet count is mildly elevated.  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  Consider polycythemia rubra vera (PV) given initial erythrocytosis, but hematocrit is now in the normal range.  Doubt myelofibrosis or CML.  If PV, would maintain hematocrit < 42.  Platelet count goal is < 400,000.  Discuss baseline bone marrow exam and initiation of hydroxyurea.  Bone marrow procedure reviewed.  Side effects of hydroxyurea reviewed.  Patient concerned about paying for medication. 2.  Left lower extremity duplex: r/o DVT. 3.  Continue baby aspirin a day. 4.  Schedule bone marrow aspirate and biopsy. 5.  Contact social work, Camera operator, regarding assistance. 6.  RTC after bone marrow for MD assessment and likely initiation of hydroxyurea.   Lequita Asal, MD  11/26/2016, 10:45 AM

## 2016-11-28 ENCOUNTER — Telehealth: Payer: Self-pay | Admitting: *Deleted

## 2016-11-28 NOTE — Telephone Encounter (Signed)
Called patient and informed her of her bone marrow test, NPO after midnight on 12-08-16 and to arrive at 0700 at the medical mall for procedure at 0800, voiced understanding.

## 2016-12-01 ENCOUNTER — Ambulatory Visit
Admission: RE | Admit: 2016-12-01 | Discharge: 2016-12-01 | Disposition: A | Discharge status: Home / Self Care | Payer: Medicare HMO | Source: Ambulatory Visit | Attending: Hematology and Oncology | Admitting: Hematology and Oncology

## 2016-12-01 DIAGNOSIS — M7989 Other specified soft tissue disorders: Secondary | ICD-10-CM

## 2016-12-01 DIAGNOSIS — M79662 Pain in left lower leg: Secondary | ICD-10-CM | POA: Diagnosis not present

## 2016-12-01 DIAGNOSIS — R600 Localized edema: Secondary | ICD-10-CM | POA: Diagnosis not present

## 2016-12-08 ENCOUNTER — Other Ambulatory Visit: Payer: Self-pay | Admitting: Radiology

## 2016-12-09 ENCOUNTER — Ambulatory Visit
Admission: RE | Admit: 2016-12-09 | Discharge: 2016-12-09 | Disposition: A | Discharge status: Home / Self Care | Payer: Medicare HMO | Source: Ambulatory Visit | Attending: Hematology and Oncology | Admitting: Hematology and Oncology

## 2016-12-09 ENCOUNTER — Encounter: Payer: Self-pay | Admitting: Hematology and Oncology

## 2016-12-09 ENCOUNTER — Ambulatory Visit: Payer: Medicare HMO

## 2016-12-09 DIAGNOSIS — K219 Gastro-esophageal reflux disease without esophagitis: Secondary | ICD-10-CM | POA: Diagnosis not present

## 2016-12-09 DIAGNOSIS — Z6837 Body mass index (BMI) 37.0-37.9, adult: Secondary | ICD-10-CM | POA: Diagnosis not present

## 2016-12-09 DIAGNOSIS — E7800 Pure hypercholesterolemia, unspecified: Secondary | ICD-10-CM | POA: Insufficient documentation

## 2016-12-09 DIAGNOSIS — Z1589 Genetic susceptibility to other disease: Secondary | ICD-10-CM

## 2016-12-09 DIAGNOSIS — D75839 Thrombocytosis, unspecified: Secondary | ICD-10-CM

## 2016-12-09 DIAGNOSIS — D471 Chronic myeloproliferative disease: Secondary | ICD-10-CM | POA: Insufficient documentation

## 2016-12-09 DIAGNOSIS — Z7982 Long term (current) use of aspirin: Secondary | ICD-10-CM | POA: Insufficient documentation

## 2016-12-09 DIAGNOSIS — D473 Essential (hemorrhagic) thrombocythemia: Secondary | ICD-10-CM

## 2016-12-09 DIAGNOSIS — R7303 Prediabetes: Secondary | ICD-10-CM | POA: Diagnosis not present

## 2016-12-09 DIAGNOSIS — Z880 Allergy status to penicillin: Secondary | ICD-10-CM | POA: Diagnosis not present

## 2016-12-09 DIAGNOSIS — E559 Vitamin D deficiency, unspecified: Secondary | ICD-10-CM | POA: Insufficient documentation

## 2016-12-09 DIAGNOSIS — Z79899 Other long term (current) drug therapy: Secondary | ICD-10-CM | POA: Insufficient documentation

## 2016-12-09 DIAGNOSIS — E669 Obesity, unspecified: Secondary | ICD-10-CM | POA: Insufficient documentation

## 2016-12-09 HISTORY — DX: Prediabetes: R73.03

## 2016-12-09 LAB — CBC
HEMATOCRIT: 42.5 % (ref 35.0–47.0)
HEMOGLOBIN: 14.2 g/dL (ref 12.0–16.0)
MCH: 28.5 pg (ref 26.0–34.0)
MCHC: 33.4 g/dL (ref 32.0–36.0)
MCV: 85.4 fL (ref 80.0–100.0)
Platelets: 583 10*3/uL — ABNORMAL HIGH (ref 150–440)
RBC: 4.97 MIL/uL (ref 3.80–5.20)
RDW: 15.7 % — ABNORMAL HIGH (ref 11.5–14.5)
WBC: 9.4 10*3/uL (ref 3.6–11.0)

## 2016-12-09 LAB — DIFFERENTIAL
BASOS ABS: 0.1 10*3/uL (ref 0–0.1)
Basophils Relative: 1 %
EOS ABS: 0.1 10*3/uL (ref 0–0.7)
Eosinophils Relative: 2 %
LYMPHS ABS: 2.9 10*3/uL (ref 1.0–3.6)
LYMPHS PCT: 32 %
MONOS PCT: 9 %
Monocytes Absolute: 0.8 10*3/uL (ref 0.2–0.9)
NEUTROS ABS: 5.2 10*3/uL (ref 1.4–6.5)
NEUTROS PCT: 56 %

## 2016-12-09 LAB — PROTIME-INR
INR: 0.97
PROTHROMBIN TIME: 12.9 s (ref 11.4–15.2)

## 2016-12-09 LAB — APTT: aPTT: 30 seconds (ref 24–36)

## 2016-12-09 MED ORDER — FENTANYL CITRATE (PF) 100 MCG/2ML IJ SOLN
INTRAMUSCULAR | Status: AC
  Filled 2016-12-09: qty 4

## 2016-12-09 MED ORDER — FENTANYL CITRATE (PF) 100 MCG/2ML IJ SOLN
INTRAMUSCULAR | Status: AC | PRN
  Administered 2016-12-09 (×2): 50 ug via INTRAVENOUS

## 2016-12-09 MED ORDER — MIDAZOLAM HCL 2 MG/2ML IJ SOLN
INTRAMUSCULAR | Status: AC | PRN
  Administered 2016-12-09 (×2): 1 mg via INTRAVENOUS

## 2016-12-09 MED ORDER — HEPARIN SOD (PORK) LOCK FLUSH 100 UNIT/ML IV SOLN
INTRAVENOUS | Status: AC
Start: 2016-12-09 — End: 2016-12-09
  Filled 2016-12-09: qty 5

## 2016-12-09 MED ORDER — HYDROCODONE-ACETAMINOPHEN 5-325 MG PO TABS
1.0000 | ORAL_TABLET | ORAL | Status: DC | PRN
  Filled 2016-12-09: qty 2

## 2016-12-09 MED ORDER — MIDAZOLAM HCL 5 MG/5ML IJ SOLN
INTRAMUSCULAR | Status: AC
  Filled 2016-12-09: qty 5

## 2016-12-09 MED ORDER — SODIUM CHLORIDE 0.9 % IV SOLN
INTRAVENOUS | Status: DC
  Administered 2016-12-09: 09:00:00 via INTRAVENOUS

## 2016-12-09 NOTE — Discharge Instructions (Signed)
Needle Biopsy of the Bone Introduction A bone biopsy is a procedure in which a small sample of bone is removed. The sample is taken with a needle. Then, the bone sample is looked at under a microscope to check for abnormalities. The sample is usually taken from a bone that is close to the skin. This procedure may be done to check for various problems with the bone. You may need this procedure if imaging tests or blood tests have indicated a possible problem. This procedure may be done to help determine if a bone tumor is cancerous (malignant). A bone biopsy can help to diagnose problems such as:  Tumors of the bone (sarcomas) and bone marrow (multiple myeloma).  Bone that forms abnormally (Paget disease).  Noncancerous (benign) bone cysts.  Bony growths.  Infections in the bone. Tell a health care provider about:  Any allergies you have.  All medicines you are taking, including vitamins, herbs, eye drops, creams, and over-the-counter medicines.  Any problems you or family members have had with anesthetic medicines.  Any blood disorders you have.  Any surgeries you have had.  Any medical conditions you have. What are the risks? Generally, this is a safe procedure. However, problems may occur, including:  Excessive bleeding.  Infection.  Injury to surrounding tissue. What happens before the procedure?  Ask your health care provider about:  Changing or stopping your regular medicines. This is especially important if you are taking diabetes medicines or blood thinners.  Taking medicines such as aspirin and ibuprofen. These medicines can thin your blood. Do not take these medicines before your procedure if your health care provider instructs you not to.  Follow instructions from your health care provider about eating or drinking restrictions.  Plan to have someone take you home after the procedure.  If you go home right after the procedure, plan to have someone with you for  24 hours. What happens during the procedure?  An IV tube may be inserted into one of your veins.  The injection site will be cleaned with a germ-killing solution (antiseptic).  You will be given one or more of the following:  A medicine to help you relax (sedative).  A medicine to numb the area (local anesthetic).  The sample of bone will be removed by putting a large needle through the skin and into the bone.  The needle will be removed.  A bandage (dressing) will be placed over the insertion site and taped in place. The procedure may vary among health care providers and hospitals. What happens after the procedure?  Your blood pressure, heart rate, breathing rate, and blood oxygen level will be monitored often until the medicines you were given have worn off.  Return to your normal activities as told by your health care provider. This information is not intended to replace advice given to you by your health care provider. Make sure you discuss any questions you have with your health care provider. Document Released: 08/28/2004 Document Revised: 03/27/2016 Document Reviewed: 11/27/2014  2017 Elsevier

## 2016-12-09 NOTE — Progress Notes (Signed)
Pt remains clinically stable post procedure. family member present, discharge instructions give with questions answered with Alyson RN, bandade dressing remains dry and intact, MD has seen patient post op.

## 2016-12-09 NOTE — H&P (Signed)
Chief Complaint: Patient was seen in consultation today for CT-guided bone marrow biopsy at the request of [place] C  Referring Physician(s): [place] [facility]  Patient Status: [facility] - Out-pt  History of Present Illness: Molly Perez is a 71 y.o. female with history of erythrocytosis and thrombocytosis. Patient is being worked up for a myeloproliferative disorder. Patient complains of a scratchy throat. Review of symptoms reveals occasional chest pain and constipation. No change in appetite or weight loss.  Past Medical History:  Diagnosis Date  . Cataract cortical, senile   . GERD (gastroesophageal reflux disease)   . Hypercholesterolemia   . Obesity   . Pre-diabetes   . Prediabetes   . Vitamin D deficiency     Past Surgical History:  Procedure Laterality Date  . ABDOMINAL HYSTERECTOMY    . COLONOSCOPY    . COLONOSCOPY WITH PROPOFOL N/A 07/04/2016   Procedure: COLONOSCOPY WITH PROPOFOL;  Surgeon: Lollie Sails, MD;  Location: [facility] ENDOSCOPY;  Service: Endoscopy;  Laterality: N/A;  . ESOPHAGOGASTRODUODENOSCOPY (EGD) WITH PROPOFOL N/A 07/04/2016   Procedure: ESOPHAGOGASTRODUODENOSCOPY (EGD) WITH PROPOFOL;  Surgeon: Lollie Sails, MD;  Location: [facility] ENDOSCOPY;  Service: Endoscopy;  Laterality: N/A;    Allergies: Amoxicillin; Lipitor [atorvastatin]; and Penicillins  Medications: Prior to Admission medications   Medication Sig Start Date End Date Taking? Authorizing Provider  acetaminophen (TYLENOL) 500 MG tablet Take by mouth.   Yes Historical Provider, MD  aspirin EC 81 MG tablet Take 81 mg by mouth daily.   Yes Historical Provider, MD  Cholecalciferol (VITAMIN D3) 5000 UNITS CAPS Take 1 capsule by mouth daily. 03/29/15  Yes Historical Provider, MD  ibuprofen (ADVIL,MOTRIN) 200 MG tablet Take 2 tablets by mouth every 4 (four) hours as needed.   Yes Historical Provider, MD  pantoprazole (PROTONIX) 40 MG tablet Take 40 mg by mouth daily.   Yes Historical  Provider, MD  pravastatin (PRAVACHOL) 80 MG tablet Take by mouth. 07/08/16 07/08/17 Yes Historical Provider, MD  vitamin B-12 (CYANOCOBALAMIN) 1000 MCG tablet Take 1 tablet (1,000 mcg total) by mouth daily. 08/01/15  Yes Adline Potter, MD  mupirocin ointment (BACTROBAN) 2 % Apply 1 application topically 3 (three) times daily. 03/20/16   Melynda Ripple, MD     Family History  Problem Relation Age of Onset  . Alzheimer's disease Father   . Congestive Heart Failure Mother   . Diabetes Mother   . Multiple sclerosis Brother   . Lupus Grandchild   . Stroke Grandchild   . Breast cancer Neg Hx     Social History   Social History  . Marital status: Married    Spouse name: Chrissie Noa  . Number of children: 3  . Years of education: 10th grade   Occupational History  .  Unemployed   Social History Main Topics  . Smoking status: Never Smoker  . Smokeless tobacco: Current User    Types: Snuff  . Alcohol use No  . Drug use: No  . Sexual activity: Not Asked   Other Topics Concern  . None   Social History Narrative  . None     Review of Systems: A 12 point ROS discussed and pertinent positives are indicated in the HPI above.  All other systems are negative.  Review of Systems  Constitutional: Negative for activity change, appetite change, chills, diaphoresis, fever and unexpected weight change.  Respiratory: Negative.   Cardiovascular: Positive for chest pain.  Gastrointestinal: Positive for constipation.  Genitourinary: Negative.     Vital Signs:  BP 115/88   Temp 98.3 F (36.8 C) (Oral)   Ht 5' (1.524 m)   Wt 191 lb (86.6 kg)   SpO2 97%   BMI 37.30 kg/m   Physical Exam  Constitutional: She appears well-developed and well-nourished.  Cardiovascular: Normal rate, regular rhythm and normal heart sounds.   Pulmonary/Chest: Effort normal and breath sounds normal. No respiratory distress. She has no wheezes. She has no rales.  Abdominal: Soft. Bowel sounds are normal. She  exhibits no distension. There is no tenderness.    Mallampati Score:  MD Evaluation Airway: WNL Heart: WNL Abdomen: WNL Chest/ Lungs: WNL ASA  Classification: 1 Mallampati/Airway Score: Two  Imaging: US Venous Img Lower Unilateral Left  Result Date: 12/01/2016 CLINICAL DATA:  Left lower extremity pain and edema for the past 4 years. Evaluate for DVT. EXAM: LEFT LOWER EXTREMITY VENOUS DOPPLER ULTRASOUND TECHNIQUE: Gray-scale sonography with graded compression, as well as color Doppler and duplex ultrasound were performed to evaluate the lower extremity deep venous systems from the level of the common femoral vein and including the common femoral, femoral, profunda femoral, popliteal and calf veins including the posterior tibial, peroneal and gastrocnemius veins when visible. The superficial great saphenous vein was also interrogated. Spectral Doppler was utilized to evaluate flow at rest and with distal augmentation maneuvers in the common femoral, femoral and popliteal veins. COMPARISON:  None. FINDINGS: Contralateral Common Femoral Vein: Respiratory phasicity is normal and symmetric with the symptomatic side. No evidence of thrombus. Normal compressibility. Common Femoral Vein: No evidence of thrombus. Normal compressibility, respiratory phasicity and response to augmentation. Saphenofemoral Junction: No evidence of thrombus. Normal compressibility and flow on color Doppler imaging. Profunda Femoral Vein: No evidence of thrombus. Normal compressibility and flow on color Doppler imaging. Femoral Vein: No evidence of thrombus. Normal compressibility, respiratory phasicity and response to augmentation. Popliteal Vein: No evidence of thrombus. Normal compressibility, respiratory phasicity and response to augmentation. Calf Veins: No evidence of thrombus. Normal compressibility and flow on color Doppler imaging. Superficial Great Saphenous Vein: No evidence of thrombus. Normal compressibility and flow on  color Doppler imaging. Venous Reflux:  None. Other Findings:  None. IMPRESSION: No evidence of DVT within the left lower extremity. Electronically Signed   By: Sandi Mariscal M.D.   On: 12/01/2016 14:18    Labs:  CBC:  Recent Labs  11/05/16 1436 12/09/16 0820  WBC 9.8 9.4  HGB 14.3 14.2  HCT 43.7 42.5  PLT 583* 583*    COAGS:  Recent Labs  12/09/16 0820  INR 0.97  APTT 30    BMP:  Recent Labs  11/05/16 1436  NA 137  K 3.9  CL 103  CO2 28  GLUCOSE 95  BUN 12  CALCIUM 9.3  CREATININE 0.96  GFRNONAA 58*  GFRAA >60    LIVER FUNCTION TESTS:  Recent Labs  11/05/16 1436  BILITOT 0.6  AST 19  ALT 17  ALKPHOS 43  PROT 8.0  ALBUMIN 3.7    TUMOR MARKERS: No results for input(s): AFPTM, CEA, CA199, CHROMGRNA in the last 8760 hours.  Assessment and Plan:  71 year old is being evaluated for possible myeloproliferative disorder. Scheduled for CT-guided bone marrow biopsy. The risks of the procedure including but not limited to bleeding and infection were discussed. Informed consent obtained from the patient. Plan for CT-guided bone marrow biopsy with moderate sedation.   Thank you for this interesting consult.  I greatly enjoyed meeting [employer] and look forward to participating in their care.  A  copy of this report was sent to the requesting provider on this date.  Electronically Signed: Carylon Perches 12/09/2016, 9:15 AM   I spent a total of  15 Minutes   in face to face in clinical consultation, greater than 50% of which was counseling/coordinating care for CT-guided bone marrow biopsy.

## 2016-12-09 NOTE — Procedures (Signed)
CT guided bone marrow biopsy.  2 aspirates and 1 core obtained from right ilium.  No immediate complication.

## 2016-12-10 ENCOUNTER — Ambulatory Visit: Payer: Medicare HMO | Admitting: Hematology and Oncology

## 2016-12-17 ENCOUNTER — Inpatient Hospital Stay: Payer: Medicare HMO | Attending: Hematology and Oncology | Admitting: Hematology and Oncology

## 2016-12-17 ENCOUNTER — Inpatient Hospital Stay: Payer: Medicare HMO

## 2016-12-17 VITALS — BP 138/88 | HR 72 | Temp 95.9°F | Resp 18 | Wt 188.3 lb

## 2016-12-17 DIAGNOSIS — F17290 Nicotine dependence, other tobacco product, uncomplicated: Secondary | ICD-10-CM | POA: Diagnosis not present

## 2016-12-17 DIAGNOSIS — R69 Illness, unspecified: Secondary | ICD-10-CM | POA: Diagnosis not present

## 2016-12-17 DIAGNOSIS — Z8719 Personal history of other diseases of the digestive system: Secondary | ICD-10-CM

## 2016-12-17 DIAGNOSIS — E669 Obesity, unspecified: Secondary | ICD-10-CM | POA: Diagnosis not present

## 2016-12-17 DIAGNOSIS — D473 Essential (hemorrhagic) thrombocythemia: Secondary | ICD-10-CM

## 2016-12-17 DIAGNOSIS — E7800 Pure hypercholesterolemia, unspecified: Secondary | ICD-10-CM

## 2016-12-17 DIAGNOSIS — E559 Vitamin D deficiency, unspecified: Secondary | ICD-10-CM

## 2016-12-17 DIAGNOSIS — Z7982 Long term (current) use of aspirin: Secondary | ICD-10-CM | POA: Diagnosis not present

## 2016-12-17 DIAGNOSIS — Z79899 Other long term (current) drug therapy: Secondary | ICD-10-CM | POA: Insufficient documentation

## 2016-12-17 DIAGNOSIS — K219 Gastro-esophageal reflux disease without esophagitis: Secondary | ICD-10-CM

## 2016-12-17 LAB — COMPREHENSIVE METABOLIC PANEL
ALT: 20 U/L (ref 14–54)
AST: 21 U/L (ref 15–41)
Albumin: 3.7 g/dL (ref 3.5–5.0)
Alkaline Phosphatase: 37 U/L — ABNORMAL LOW (ref 38–126)
Anion gap: 7 (ref 5–15)
BUN: 9 mg/dL (ref 6–20)
CO2: 26 mmol/L (ref 22–32)
Calcium: 8.8 mg/dL — ABNORMAL LOW (ref 8.9–10.3)
Chloride: 104 mmol/L (ref 101–111)
Creatinine, Ser: 0.9 mg/dL (ref 0.44–1.00)
GFR calc Af Amer: >60 mL/min (ref >60)
GFR calc non Af Amer: >60 mL/min (ref >60)
Glucose, Bld: 96 mg/dL (ref 65–99)
Potassium: 3.7 mmol/L (ref 3.5–5.1)
Sodium: 137 mmol/L (ref 135–145)
Total Bilirubin: 0.3 mg/dL (ref 0.3–1.2)
Total Protein: 8 g/dL (ref 6.5–8.1)

## 2016-12-17 MED ORDER — ONDANSETRON HCL 8 MG PO TABS: 8.0000 mg | ORAL_TABLET | Freq: Three times a day (TID) | ORAL | 0 refills | Status: DC | PRN

## 2016-12-17 MED ORDER — HYDROXYUREA 500 MG PO CAPS: 500.0000 mg | ORAL_CAPSULE | Freq: Every day | ORAL | 2 refills | Status: DC

## 2016-12-17 NOTE — Progress Notes (Signed)
[facility] day:  12/17/2016  Chief Complaint: Molly Perez is a 71 y.o. 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  HPI:  The patient was last seen in the hematology clinic on 11/26/2016.  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  Bone marrow aspirate and biopsy were scheduled.  At last visit, she complained of left leg discomfort.  Duplex on 12/01/2016 revealed no evidence of DVT.  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  Marrow was normocellular to mildy hypercellular for age (40-50%) with trilineage hematopoiesis including a proliferation of atypical megakaryocytes with focal clusters and no increase in blasts.  There was no significant increase in marrow reticulin fibers.  Storage iron was present.  Cytogenetics were normal (59, XX).  Flow cytometry was negative.  FISH studies for MPN/CML were negative.  Symptomatically, she noted a little bit of diarrhea on Monday, 12/15/2016. Her husband and grandson have been sick.  Bowels are improved today.  She had a headache last night.   Past Medical History:  Diagnosis Date  . Cataract cortical, senile   . GERD (gastroesophageal reflux disease)   . Hypercholesterolemia   . Obesity   . Pre-diabetes   . Prediabetes   . Vitamin D deficiency     Past Surgical History:  Procedure Laterality Date  . ABDOMINAL HYSTERECTOMY    . COLONOSCOPY    . COLONOSCOPY WITH PROPOFOL N/A 07/04/2016   Procedure: COLONOSCOPY WITH PROPOFOL;  Surgeon: Lollie Sails, MD;  Location: [facility] ENDOSCOPY;  Service: Endoscopy;  Laterality: N/A;  . ESOPHAGOGASTRODUODENOSCOPY (EGD) WITH PROPOFOL N/A 07/04/2016   Procedure:  ESOPHAGOGASTRODUODENOSCOPY (EGD) WITH PROPOFOL;  Surgeon: Lollie Sails, MD;  Location: [facility] ENDOSCOPY;  Service: Endoscopy;  Laterality: N/A;    Family History  Problem Relation Age of Onset  . Alzheimer's disease Father   . Congestive Heart Failure Mother   . Diabetes Mother   . Multiple sclerosis Brother   . Lupus Grandchild   . Stroke Grandchild   . Breast cancer Neg Hx     Social History:  reports that she has never smoked. Her smokeless tobacco use includes Snuff. She reports that she does not drink alcohol or use drugs.  She lives in [place].  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  She retired at age 51.  She worked for [employer].  She denies any exposure to radiation or toxins.  She is concerned about finannces and paying for new medications.  The patient is alone today.  Allergies:  Allergies  Allergen Reactions  . Amoxicillin Other (See Comments)  . Lipitor [Atorvastatin] Other (See Comments)    Weakness in legs Weakness in legs  . Penicillins Other (See Comments)    Pt states medication upsets her stomach    Current Medications: Current Outpatient Prescriptions  Medication Sig Dispense Refill  . acetaminophen (TYLENOL) 500 MG tablet Take by mouth.    Marland Kitchen aspirin EC 81 MG tablet Take 81 mg by mouth daily.    . Cholecalciferol (VITAMIN D3) 5000 UNITS CAPS Take 1 capsule by mouth daily.    Marland Kitchen ibuprofen (ADVIL,MOTRIN) 200 MG tablet Take 2 tablets by mouth every 4 (four) hours as needed.    . mupirocin ointment (  BACTROBAN) 2 % Apply 1 application topically 3 (three) times daily. 22 g 0  . pantoprazole (PROTONIX) 40 MG tablet Take 40 mg by mouth daily.    . pravastatin (PRAVACHOL) 80 MG tablet Take by mouth.    . vitamin B-12 (CYANOCOBALAMIN) 1000 MCG tablet Take 1 tablet (1,000 mcg total) by mouth daily. 30 tablet 0  . hydroxyurea (HYDREA) 500 MG capsule Take 1 capsule (500 mg total) by mouth daily. May take with food to minimize  GI side effects. 30 capsule 2  . ondansetron (ZOFRAN) 8 MG tablet Take 1 tablet (8 mg total) by mouth every 8 (eight) hours as needed for nausea or vomiting. 20 tablet 0   No current facility-administered medications for this visit.     Review of Systems:  GENERAL:  Feels "ok".  No fevers.  Sweats for years.  No weight loss. PERFORMANCE STATUS (ECOG):  1 HEENT:  Cataracts.  No visual changes, runny nose, sore throat, mouth sores or tenderness. Lungs: Shortness of breath with walking.  No cough.  No hemoptysis.  No sleep apnea. Cardiac:  No chest pain, palpitations, orthopnea, or PND. GI:  Reflux and upset stomach (no change). Constipation managed with Miralax.  No nausea, vomiting, diarrhea, melena or hematochezia. GU:  No urgency, frequency, dysuria, or hematuria. Musculoskeletal:  Right knee issues following jump off a truck.  Left leg discomfort (unknown if swollen).  No muscle tenderness. Extremities:  Restless legs.  Left leg discomfort without trauma.  No apparent swelling. Skin:  No rashes or skin changes. Neuro:  Slight headache last night.  No numbness or weakness, balance or coordination issues. Endocrine:  Prediabetic.  No thyroid issues, hot flashes or night sweats. Psych:  No mood changes, depression or anxiety. Pain:  Left leg discomfort. Review of systems:  All other systems reviewed and found to be negative.  Physical Exam: Blood pressure 138/88, pulse 72, temperature (!) 95.9 F (35.5 C), temperature source Tympanic, resp. rate 18, weight 188 lb 4.4 oz (85.4 kg). GENERAL:  Well developed, well nourished, woman sitting comfortably in the exam room in no acute distress. MENTAL STATUS:  Alert and oriented to person, place and time. HEAD:  Long brown hair pulled back.  Normocephalic, atraumatic, face symmetric, no Cushingoid features. EYES:  Glasses.  Brown eyes.  Pupils equal round and reactive to light and accomodation.  No conjunctivitis or scleral icterus. ENT:   Oropharynx clear without lesion.  Poor dentition.  Tongue normal.  Mucous membranes moist.  RESPIRATORY:  Clear to auscultation without rales, wheezes or rhonchi. CARDIOVASCULAR:  Regular rate and rhythm without murmur, rub or gallop. ABDOMEN:  Soft, non-tender, with active bowel sounds, and no hepatosplenomegaly.  No masses. SKIN:  Right posterior iliac crest s/p biopsy with resolving small area of ecchymosis.  No increased warmth or tenderness.  No rashes, ulcers or lesions. EXTREMITIES: No edema, no skin discoloration or tenderness.  No palpable cords. LYMPH NODES: No palpable cervical, supraclavicular, axillary or inguinal adenopathy  NEUROLOGICAL: Unremarkable. PSYCH:  Appropriate.   Appointment on 12/17/2016  Component Date Value Ref Range Status  . Sodium 12/17/2016 137  135 - 145 mmol/L Final  . Potassium 12/17/2016 3.7  3.5 - 5.1 mmol/L Final  . Chloride 12/17/2016 104  101 - 111 mmol/L Final  . CO2 12/17/2016 26  22 - 32 mmol/L Final  . Glucose, Bld 12/17/2016 96  65 - 99 mg/dL Final  . BUN 12/17/2016 9  6 - 20 mg/dL Final  . Creatinine, Ser  12/17/2016 0.90  0.44 - 1.00 mg/dL Final  . Calcium 12/17/2016 8.8* 8.9 - 10.3 mg/dL Final  . Total Protein 12/17/2016 8.0  6.5 - 8.1 g/dL Final  . Albumin 12/17/2016 3.7  3.5 - 5.0 g/dL Final  . AST 12/17/2016 21  15 - 41 U/L Final  . ALT 12/17/2016 20  14 - 54 U/L Final  . Alkaline Phosphatase 12/17/2016 37* 38 - 126 U/L Final  . Total Bilirubin 12/17/2016 0.3  0.3 - 1.2 mg/dL Final  . GFR calc non Af Amer 12/17/2016 >60  >60 mL/min Final  . GFR calc Af Amer 12/17/2016 >60  >60 mL/min Final   Comment: (NOTE) The eGFR has been calculated using the CKD EPI equation. This calculation has not been validated in all clinical situations. eGFR's persistently <60 mL/min signify possible Chronic Kidney Disease.   . Anion gap 12/17/2016 7  5 - 15 Final    Assessment:  Molly Perez is a 71 y.o. 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  JAK2  V617F was positive on 11/05/2016.  She has a history of mild leukocytosis, thromobocytosis, and erythrocytosis.   Platelet count has ranged between 512,000 - 622,000 since 03/24/2016.  WBC has been mildly elevated (10,400 - 10,600).  Hematocrit was normal until 09/18/2016.  At that time, hematocrit was 50.7 with a hemoglobin of 16.6.  Work-up on 11/05/2016 revealed a hematocrit of 43.7, hemoglobin 14.3, platelets 583,00, WBC 9800 with an ANC of 6600.  Normal studies included:  Ferritin, iron studies, and erythropoietin level.  JAK2 was positive for V617F.  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  Marrow was normocellular to mildy hypercellular for age (40-50%) with trilineage hematopoiesis including a proliferation of atypical megakaryocytes with focal clusters and no increase in blasts.  There was no significant increase in marrow reticulin fibers.  Storage iron was present.  Cytogenetics were normal (24, XX).  Flow cytometry was negative.  FISH studies for MPN/CML were negative.  EGD on 07/04/2016 revealed reflux esophagitis, H pylori gastritis, and a small hiatal hernia.  H pylori was treated with metronidazole, clarithromycin, and omeprazole.  Colonoscopy on 07/04/2016 was normal.  Symptomatically, she has had a recent gastroenteritis.  Exam is unremakable.  Plan: 1.  Discuss bone marrow aspirate and biopsy.  Discuss diagnosis of essential thrombocythemia.  Discuss plan to initiate treatment with hydroxyurea.  Discuss side effects of therapy.  Information provided.  Discuss plan to begin 1 pill a day.  Goal platelet count is 400,000.  Discuss anti-emetics as needed.  Patient expressed concern for paying drug.  Discussed follow-up with social work.  Several questions asked and answered. 2.  Rx: hydroxyurea 500 mg po q day. 3.  Rx: ondansetron 8 mg po q 8 hours prn nausea (dis: #20). 4.  Continue baby  aspirin a day. 5.  Labs today: CMP. 6.  Patient to talk to [facility], SW re: medication assistance 7.  RTC in 2 weeks for MD assessment and labs (CBC with diff, CMP).   Lequita Asal, MD  12/17/2016

## 2016-12-17 NOTE — Patient Instructions (Signed)
Hydroxyurea capsules What is this medicine? HYDROXYUREA (hye drox ee yoor EE a) is a chemotherapy drug. This medicine is used to treat certain types of leukemias and head and neck cancer. It is also used to control the painful crises of sickle cell anemia. This medicine may be used for other purposes; ask your health care provider or pharmacist if you have questions. COMMON BRAND NAME(S): Droxia, Hydrea What should I tell my health care provider before I take this medicine? They need to know if you have any of these conditions: -HIV or AIDS -kidney disease or on hemodialysis -liver disease -low blood counts, like low white cell, platelet, or red cell counts -prior or current interferon therapy -recent or ongoing radiation therapy -an unusual or allergic reaction to hydroxyurea, other chemotherapy, other medicines, foods, dyes, or preservatives -pregnant or trying to get pregnant -breast-feeding How should I use this medicine? Take this medicine by mouth with a glass of water. Follow the directions on the prescription label. Take your medicine at regular intervals. Do not take it more often than directed. Do not stop taking except on your doctor's advice. People who are not taking this medicine should not be exposed to it. Wash your hands before and after handling your bottle or medicine. Caregivers should wear disposable gloves if they must touch the bottle or medicine. Clean up any medicine powder that spills with a damp disposable towel and throw the towel away in a closed container, such as a plastic bag. Talk to your pediatrician regarding the use of this medicine in children. Special care may be needed. Overdosage: If you think you have taken too much of this medicine contact a poison control center or emergency room at once. NOTE: This medicine is only for you. Do not share this medicine with others. What if I miss a dose? If you miss a dose, take it as soon as you can. If it is almost  time for your next dose, take only that dose. Do not take double or extra doses. What may interact with this medicine? This medicine may also interact with the following medications: -didanosine -stavudine -live virus vaccines This list may not describe all possible interactions. Give your health care provider a list of all the medicines, herbs, non-prescription drugs, or dietary supplements you use. Also tell them if you smoke, drink alcohol, or use illegal drugs. Some items may interact with your medicine. What should I watch for while using this medicine? This drug may make you feel generally unwell. This is not uncommon, as chemotherapy can affect healthy cells as well as cancer cells. Report any side effects. Continue your course of treatment even though you feel ill unless your doctor tells you to stop. You will receive regular blood tests during your treatment. Call your doctor or health care professional for advice if you get a fever, chills or sore throat, or other symptoms of a cold or flu. Do not treat yourself. This drug decreases your body's ability to fight infections. Try to avoid being around people who are sick. This medicine may increase your risk to bruise or bleed. Call your doctor or health care professional if you notice any unusual bleeding. Talk to your doctor about your risk of cancer. You may be more at risk for certain types of cancers if you take this medicine. You may need blood work done while you are taking this medicine. Do not become pregnant while taking this medicine or for at least 6 months after stopping  it. Women should inform their doctor if they wish to become pregnant or think they might be pregnant. Men should not father a child while taking this medicine and for at least a year after stopping it. There is a potential for serious side effects to an unborn child. Talk to your health care professional or pharmacist for more information. Do not breast-feed an  infant while taking this medicine. This may interfere with the ability to have or father a child. You should talk with your doctor or health care professional if you are concerned about your fertility. What side effects may I notice from receiving this medicine? Side effects that you should report to your doctor or health care professional as soon as possible: -allergic reactions like skin rash, itching or hives, swelling of the face, lips, or tongue -low blood counts - this medicine may decrease the number of white blood cells, red blood cells and platelets. You may be at increased risk for infections and bleeding. -signs of infection - fever or chills, cough, sore throat, pain or difficulty passing urine -signs of decreased platelets or bleeding - bruising, pinpoint red spots on the skin, black, tarry stools, blood in the urine -signs of decreased red blood cells - unusually weak or tired, fainting spells, lightheadedness -breathing problems -burning, redness or pain at the site of any radiation therapy -changes in skin color -confusion -mouth sores -pain, tingling, numbness in the hands or feet -seizures -skin ulcers -trouble passing urine or change in the amount of urine -vomiting Side effects that usually do not require medical attention (report to your doctor or health care professional if they continue or are bothersome): -headache -loss of appetite -red color to the face This list may not describe all possible side effects. Call your doctor for medical advice about side effects. You may report side effects to FDA at 1-800-FDA-1088. Where should I keep my medicine? Keep out of the reach of children. See product for storage instructions. Each product may have different instructions. Keep tightly closed. Throw away any unused medicine after the expiration date. NOTE: This sheet is a summary. It may not cover all possible information. If you have questions about this medicine, talk to  your doctor, pharmacist, or health care provider.  2017 [employer]vier/Gold Standard (2015-02-02 16:51:41)

## 2016-12-18 ENCOUNTER — Telehealth: Payer: Self-pay | Admitting: *Deleted

## 2016-12-18 NOTE — Telephone Encounter (Signed)
Called patient back and after Dr. Mike Gip reviewed her labs it is ok to start the hydroxurea when she picked it up. Patient reports that she will get it tomorrow but she will not start it until Sunday.  Patient reported discussing medication with a family member that was an Therapist, sports and how she should take the medication.  Medication administration orders reviewed with patient and patient instructed to only take the medication as ordered by the physician, voiced understanding.

## 2016-12-20 ENCOUNTER — Encounter: Payer: Self-pay | Admitting: Hematology and Oncology

## 2016-12-31 ENCOUNTER — Inpatient Hospital Stay (HOSPITAL_BASED_OUTPATIENT_CLINIC_OR_DEPARTMENT_OTHER): Payer: Medicare HMO | Admitting: Hematology and Oncology

## 2016-12-31 ENCOUNTER — Encounter: Payer: Self-pay | Admitting: Hematology and Oncology

## 2016-12-31 ENCOUNTER — Inpatient Hospital Stay: Payer: Medicare HMO

## 2016-12-31 VITALS — BP 146/84 | HR 71 | Temp 97.6°F | Resp 18 | Wt 191.8 lb

## 2016-12-31 DIAGNOSIS — E7800 Pure hypercholesterolemia, unspecified: Secondary | ICD-10-CM | POA: Diagnosis not present

## 2016-12-31 DIAGNOSIS — F17290 Nicotine dependence, other tobacco product, uncomplicated: Secondary | ICD-10-CM | POA: Diagnosis not present

## 2016-12-31 DIAGNOSIS — D473 Essential (hemorrhagic) thrombocythemia: Secondary | ICD-10-CM | POA: Diagnosis not present

## 2016-12-31 DIAGNOSIS — Z79899 Other long term (current) drug therapy: Secondary | ICD-10-CM

## 2016-12-31 DIAGNOSIS — E559 Vitamin D deficiency, unspecified: Secondary | ICD-10-CM | POA: Diagnosis not present

## 2016-12-31 DIAGNOSIS — K219 Gastro-esophageal reflux disease without esophagitis: Secondary | ICD-10-CM | POA: Diagnosis not present

## 2016-12-31 DIAGNOSIS — Z8719 Personal history of other diseases of the digestive system: Secondary | ICD-10-CM | POA: Diagnosis not present

## 2016-12-31 DIAGNOSIS — Z7982 Long term (current) use of aspirin: Secondary | ICD-10-CM | POA: Diagnosis not present

## 2016-12-31 DIAGNOSIS — E669 Obesity, unspecified: Secondary | ICD-10-CM | POA: Diagnosis not present

## 2016-12-31 DIAGNOSIS — R69 Illness, unspecified: Secondary | ICD-10-CM | POA: Diagnosis not present

## 2016-12-31 LAB — COMPREHENSIVE METABOLIC PANEL
ALT: 19 U/L (ref 14–54)
AST: 19 U/L (ref 15–41)
Albumin: 3.5 g/dL (ref 3.5–5.0)
Alkaline Phosphatase: 37 U/L — ABNORMAL LOW (ref 38–126)
Anion gap: 7 (ref 5–15)
BUN: 10 mg/dL (ref 6–20)
CO2: 27 mmol/L (ref 22–32)
Calcium: 9.2 mg/dL (ref 8.9–10.3)
Chloride: 103 mmol/L (ref 101–111)
Creatinine, Ser: 0.88 mg/dL (ref 0.44–1.00)
GFR calc Af Amer: >60 mL/min (ref >60)
GFR calc non Af Amer: >60 mL/min (ref >60)
Glucose, Bld: 89 mg/dL (ref 65–99)
Potassium: 4 mmol/L (ref 3.5–5.1)
Sodium: 137 mmol/L (ref 135–145)
Total Bilirubin: 0.3 mg/dL (ref 0.3–1.2)
Total Protein: 7.8 g/dL (ref 6.5–8.1)

## 2016-12-31 LAB — CBC WITH DIFFERENTIAL/PLATELET
Basophils Absolute: 0.1 10*3/uL (ref 0–0.1)
Basophils Relative: 1 %
Eosinophils Absolute: 0.1 10*3/uL (ref 0–0.7)
Eosinophils Relative: 1 %
HCT: 41.8 % (ref 35.0–47.0)
Hemoglobin: 13.5 g/dL (ref 12.0–16.0)
Lymphocytes Relative: 23 %
Lymphs Abs: 2.6 10*3/uL (ref 1.0–3.6)
MCH: 27.9 pg (ref 26.0–34.0)
MCHC: 32.4 g/dL (ref 32.0–36.0)
MCV: 86.1 fL (ref 80.0–100.0)
Monocytes Absolute: 0.9 10*3/uL (ref 0.2–0.9)
Monocytes Relative: 9 %
Neutro Abs: 7.4 10*3/uL — ABNORMAL HIGH (ref 1.4–6.5)
Neutrophils Relative %: 66 %
Platelets: 538 10*3/uL — ABNORMAL HIGH (ref 150–440)
RBC: 4.85 MIL/uL (ref 3.80–5.20)
RDW: 15.7 % — ABNORMAL HIGH (ref 11.5–14.5)
WBC: 11 10*3/uL (ref 3.6–11.0)

## 2016-12-31 NOTE — Progress Notes (Signed)
[facility] day:  12/31/2016  Chief Complaint: Molly Perez is a 71 y.o. female with essential thrombocythemia (ET) who is seen 2 week assessment on hydroxyurea.  HPI:  The patient was last seen in the hematology clinic on 12/17/2016.  At that time, bone marrow was reviewed which confirmed ET.  We discussed initiation of hydroxyurea.  She was given a prescription for hydroxyurea 500 mg a day.  She was working with Elease Etienne, Education officer, [employer], for medication assistance.  During  the interim, she has done well.  She started her hydroxyurea on 12/21/2016.  She denies any side effects.  She states that he blood pressure is up because of the co-pay.  She is on a fixed income.   Past Medical History:  Diagnosis Date  . Cataract cortical, senile   . GERD (gastroesophageal reflux disease)   . Hypercholesterolemia   . Obesity   . Pre-diabetes   . Prediabetes   . Vitamin D deficiency      Past Surgical History:  Procedure Laterality Date  . ABDOMINAL HYSTERECTOMY    . COLONOSCOPY    . COLONOSCOPY WITH PROPOFOL N/A 07/04/2016   Procedure: COLONOSCOPY WITH PROPOFOL;  Surgeon: Lollie Sails, MD;  Location: [facility] ENDOSCOPY;  Service: Endoscopy;  Laterality: N/A;  . ESOPHAGOGASTRODUODENOSCOPY (EGD) WITH PROPOFOL N/A 07/04/2016   Procedure: ESOPHAGOGASTRODUODENOSCOPY (EGD) WITH PROPOFOL;  Surgeon: Lollie Sails, MD;  Location: [facility] ENDOSCOPY;  Service: Endoscopy;  Laterality: N/A;    Family History  Problem Relation Age of Onset  . Alzheimer's disease Father   . Congestive Heart Failure Mother   . Diabetes Mother   . Multiple sclerosis Brother   . Lupus Grandchild   . Stroke Grandchild   . Breast cancer Neg Hx     Social History:  reports that she has never smoked. Her smokeless tobacco use includes Snuff. She reports that she does not drink alcohol or use drugs.  She lives in [place].  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  She  retired at age 70.  She worked for [employer].  She denies any exposure to radiation or toxins.  She is concerned about finannces and paying for new medications.  She is on a fixed income.  The patient is alone today.  Allergies:  Allergies  Allergen Reactions  . Amoxicillin Other (See Comments)  . Lipitor [Atorvastatin] Other (See Comments)    Weakness in legs Weakness in legs  . Penicillins Other (See Comments)    Pt states medication upsets her stomach    Current Medications: Current Outpatient Prescriptions  Medication Sig Dispense Refill  . aspirin EC 81 MG tablet Take 81 mg by mouth daily.    . Cholecalciferol (VITAMIN D3) 5000 UNITS CAPS Take 1 capsule by mouth daily.    . hydroxyurea (HYDREA) 500 MG capsule Take 1 capsule (500 mg total) by mouth daily. May take with food to minimize GI side effects. 30 capsule 2  . pantoprazole (PROTONIX) 40 MG tablet Take 40 mg by mouth daily.    . pravastatin (PRAVACHOL) 80 MG tablet Take by mouth.    . vitamin B-12 (CYANOCOBALAMIN) 1000 MCG tablet Take 1 tablet (1,000 mcg total) by mouth daily. 30 tablet 0  . acetaminophen (TYLENOL) 500 MG tablet Take by mouth.    Marland Kitchen ibuprofen (ADVIL,MOTRIN) 200 MG tablet Take 2 tablets by mouth every 4 (four) hours as needed.    . mupirocin ointment (  BACTROBAN) 2 % Apply 1 application topically 3 (three) times daily. (Patient not taking: Reported on 12/31/2016) 22 g 0  . ondansetron (ZOFRAN) 8 MG tablet Take 1 tablet (8 mg total) by mouth every 8 (eight) hours as needed for nausea or vomiting. (Patient not taking: Reported on 12/31/2016) 20 tablet 0   No current facility-administered medications for this visit.     Review of Systems:  GENERAL:  Feels "fine".  No fevers.  Sweats for years.  No up 3 pounds. PERFORMANCE STATUS (ECOG):  1 HEENT:  Cataracts.  No visual changes, runny nose, sore throat, mouth sores or tenderness. Lungs: Shortness of breath with exertion.  No cough.   No hemoptysis.  No sleep apnea. Cardiac:  No chest pain, palpitations, orthopnea, or PND. GI:  Reflux and upset stomach (no change). Constipation managed with Miralax.  No nausea, vomiting, diarrhea, melena or hematochezia. GU:  No urgency, frequency, dysuria, or hematuria. Musculoskeletal:  Right knee issues following jump off a truck.  Left leg discomfort (unknown if swollen).  No muscle tenderness. Extremities:  Restless legs.  Left leg discomfort without trauma.  No apparent swelling. Skin:  No rashes or skin changes. Neuro:  No headache, numbness or weakness, balance or coordination issues. Endocrine:  Prediabetic.  No thyroid issues, hot flashes or night sweats. Psych:  No mood changes, depression or anxiety. Pain:  Left leg discomfort. Review of systems:  All other systems reviewed and found to be negative.  Physical Exam: Blood pressure (!) 146/84, pulse 71, temperature 97.6 F (36.4 C), temperature source Tympanic, resp. rate 18, weight 191 lb 12.8 oz (87 kg). GENERAL:  Well developed, well nourished, woman sitting comfortably in the exam room in no acute distress. MENTAL STATUS:  Alert and oriented to person, place and time. HEAD:  Long light brown hair pulled back.  Normocephalic, atraumatic, face symmetric, no Cushingoid features. EYES:  Glasses.  Brown eyes.  Pupils equal round and reactive to light and accomodation.  No conjunctivitis or scleral icterus. ENT:  Oropharynx clear without lesion.  Poor dentition.  Tongue normal.  Mucous membranes moist.  RESPIRATORY:  Clear to auscultation without rales, wheezes or rhonchi. CARDIOVASCULAR:  Regular rate and rhythm without murmur, rub or gallop. ABDOMEN:  Soft, non-tender, with active bowel sounds, and no hepatosplenomegaly.  No masses. SKIN:  No rashes, ulcers or lesions. EXTREMITIES: No edema, no skin discoloration or tenderness.  No palpable cords. LYMPH NODES: No palpable cervical, supraclavicular, axillary or inguinal  adenopathy  NEUROLOGICAL: Unremarkable. PSYCH:  Appropriate.   Appointment on 12/31/2016  Component Date Value Ref Range Status  . WBC 12/31/2016 11.0  3.6 - 11.0 K/uL Final  . RBC 12/31/2016 4.85  3.80 - 5.20 MIL/uL Final  . Hemoglobin 12/31/2016 13.5  12.0 - 16.0 g/dL Final  . HCT 12/31/2016 41.8  35.0 - 47.0 % Final  . MCV 12/31/2016 86.1  80.0 - 100.0 fL Final  . MCH 12/31/2016 27.9  26.0 - 34.0 pg Final  . MCHC 12/31/2016 32.4  32.0 - 36.0 g/dL Final  . RDW 12/31/2016 15.7* 11.5 - 14.5 % Final  . Platelets 12/31/2016 538* 150 - 440 K/uL Final  . Neutrophils Relative % 12/31/2016 66  % Final  . Neutro Abs 12/31/2016 7.4* 1.4 - 6.5 K/uL Final  . Lymphocytes Relative 12/31/2016 23  % Final  . Lymphs Abs 12/31/2016 2.6  1.0 - 3.6 K/uL Final  . Monocytes Relative 12/31/2016 9  % Final  . Monocytes Absolute 12/31/2016 0.9  0.2 - 0.9 K/uL Final  . Eosinophils Relative 12/31/2016 1  % Final  . Eosinophils Absolute 12/31/2016 0.1  0 - 0.7 K/uL Final  . Basophils Relative 12/31/2016 1  % Final  . Basophils Absolute 12/31/2016 0.1  0 - 0.1 K/uL Final  . Sodium 12/31/2016 137  135 - 145 mmol/L Final  . Potassium 12/31/2016 4.0  3.5 - 5.1 mmol/L Final  . Chloride 12/31/2016 103  101 - 111 mmol/L Final  . CO2 12/31/2016 27  22 - 32 mmol/L Final  . Glucose, Bld 12/31/2016 89  65 - 99 mg/dL Final  . BUN 12/31/2016 10  6 - 20 mg/dL Final  . Creatinine, Ser 12/31/2016 0.88  0.44 - 1.00 mg/dL Final  . Calcium 12/31/2016 9.2  8.9 - 10.3 mg/dL Final  . Total Protein 12/31/2016 7.8  6.5 - 8.1 g/dL Final  . Albumin 12/31/2016 3.5  3.5 - 5.0 g/dL Final  . AST 12/31/2016 19  15 - 41 U/L Final  . ALT 12/31/2016 19  14 - 54 U/L Final  . Alkaline Phosphatase 12/31/2016 37* 38 - 126 U/L Final  . Total Bilirubin 12/31/2016 0.3  0.3 - 1.2 mg/dL Final  . GFR calc non Af Amer 12/31/2016 >60  >60 mL/min Final  . GFR calc Af Amer 12/31/2016 >60  >60 mL/min Final   Comment: (NOTE) The eGFR has been  calculated using the CKD EPI equation. This calculation has not been validated in all clinical situations. eGFR's persistently <60 mL/min signify possible Chronic Kidney Disease.   . Anion gap 12/31/2016 7  5 - 15 Final    Assessment:  Molly Perez is a 71 y.o. 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  She has a history of mild leukocytosis, thromobocytosis, and erythrocytosis.   Platelet count has ranged between 512,000 - 622,000 since 03/24/2016.  WBC has been mildly elevated (10,400 - 10,600).  Hematocrit was normal until 09/18/2016.  At that time, hematocrit was 50.7 with a hemoglobin of 16.6.  She is on a baby aspirin.  Work-up on 11/05/2016 revealed a hematocrit of 43.7, hemoglobin 14.3, platelets 583,00, WBC 9800 with an ANC of 6600.  Normal studies included:  Ferritin, iron studies, and erythropoietin level.  JAK2 was positive for V617F.  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  Marrow was normocellular to mildy hypercellular for age (40-50%) with trilineage hematopoiesis including a proliferation of atypical megakaryocytes with focal clusters and no increase in blasts.  There was no significant increase in marrow reticulin fibers.  Storage iron was present.  Cytogenetics were normal (68, XX).  Flow cytometry was negative.  FISH studies for MPN/CML were negative.  She is on hydroxyurea (began 12/21/2016).  She denies any side effects.  EGD on 07/04/2016 revealed reflux esophagitis, H pylori gastritis, and a small hiatal hernia.  H pylori was treated with metronidazole, clarithromycin, and omeprazole.  Colonoscopy on 07/04/2016 was normal.  Symptomatically, she has had a recent gastroenteritis.  Exam is unremakable.  Platelet count is 538,000.  Plan: 1.  Labs today:  CBC with diff, CMP. 2.  Discuss labs today.  Platelet count has decreased slightly.  Goal  platelet count is 400,000.  3.  Continue hydroxyurea 500 mg po q day. 4.  Continue baby aspirin a day. 5.  Discuss patient's concern about finances (appointments and cost of medication).  Discuss f/u every 3 month clinic visits once counts are  stable.  Medication assistance in place. 6.  RTC in 3 weeks for labs (CBC with diff). 7.  RTC in 7 weeks for MD assessment and labs (CBC with diff, CMP).   Lequita Asal, MD  12/31/2016, 12:23 PM

## 2017-01-14 ENCOUNTER — Telehealth: Payer: Self-pay | Admitting: *Deleted

## 2017-01-14 NOTE — Telephone Encounter (Signed)
Attempted to call patient.  No answer.  LVM for patient to return call to [place].

## 2017-01-14 NOTE — Telephone Encounter (Signed)
vm msg left on Dr. Aletha Halim team line.  Pt asking for a return phone call from Dr. Mike Gip

## 2017-01-21 ENCOUNTER — Other Ambulatory Visit: Payer: Self-pay | Admitting: *Deleted

## 2017-01-21 ENCOUNTER — Inpatient Hospital Stay (HOSPITAL_BASED_OUTPATIENT_CLINIC_OR_DEPARTMENT_OTHER): Payer: Medicare HMO | Admitting: Hematology and Oncology

## 2017-01-21 ENCOUNTER — Inpatient Hospital Stay: Payer: Medicare HMO | Attending: Hematology and Oncology

## 2017-01-21 VITALS — BP 143/84 | HR 80 | Temp 97.4°F | Resp 18 | Wt 195.1 lb

## 2017-01-21 DIAGNOSIS — R7303 Prediabetes: Secondary | ICD-10-CM | POA: Insufficient documentation

## 2017-01-21 DIAGNOSIS — E669 Obesity, unspecified: Secondary | ICD-10-CM | POA: Diagnosis not present

## 2017-01-21 DIAGNOSIS — M79605 Pain in left leg: Secondary | ICD-10-CM

## 2017-01-21 DIAGNOSIS — K210 Gastro-esophageal reflux disease with esophagitis: Secondary | ICD-10-CM

## 2017-01-21 DIAGNOSIS — K449 Diaphragmatic hernia without obstruction or gangrene: Secondary | ICD-10-CM | POA: Insufficient documentation

## 2017-01-21 DIAGNOSIS — K2970 Gastritis, unspecified, without bleeding: Secondary | ICD-10-CM | POA: Diagnosis not present

## 2017-01-21 DIAGNOSIS — D473 Essential (hemorrhagic) thrombocythemia: Secondary | ICD-10-CM

## 2017-01-21 DIAGNOSIS — Z7982 Long term (current) use of aspirin: Secondary | ICD-10-CM | POA: Diagnosis not present

## 2017-01-21 DIAGNOSIS — E559 Vitamin D deficiency, unspecified: Secondary | ICD-10-CM | POA: Diagnosis not present

## 2017-01-21 DIAGNOSIS — Z79899 Other long term (current) drug therapy: Secondary | ICD-10-CM | POA: Insufficient documentation

## 2017-01-21 DIAGNOSIS — K219 Gastro-esophageal reflux disease without esophagitis: Secondary | ICD-10-CM | POA: Insufficient documentation

## 2017-01-21 DIAGNOSIS — E7800 Pure hypercholesterolemia, unspecified: Secondary | ICD-10-CM

## 2017-01-21 DIAGNOSIS — B9681 Helicobacter pylori [H. pylori] as the cause of diseases classified elsewhere: Secondary | ICD-10-CM | POA: Diagnosis not present

## 2017-01-21 DIAGNOSIS — M79662 Pain in left lower leg: Secondary | ICD-10-CM | POA: Diagnosis not present

## 2017-01-21 DIAGNOSIS — F17290 Nicotine dependence, other tobacco product, uncomplicated: Secondary | ICD-10-CM | POA: Diagnosis not present

## 2017-01-21 LAB — CBC WITH DIFFERENTIAL/PLATELET
Basophils Absolute: 0.1 10*3/uL (ref 0–0.1)
Basophils Relative: 1 %
Eosinophils Absolute: 0.1 10*3/uL (ref 0–0.7)
Eosinophils Relative: 1 %
HCT: 42.5 % (ref 35.0–47.0)
Hemoglobin: 13.9 g/dL (ref 12.0–16.0)
Lymphocytes Relative: 35 %
Lymphs Abs: 3 10*3/uL (ref 1.0–3.6)
MCH: 28.6 pg (ref 26.0–34.0)
MCHC: 32.7 g/dL (ref 32.0–36.0)
MCV: 87.6 fL (ref 80.0–100.0)
Monocytes Absolute: 0.6 10*3/uL (ref 0.2–0.9)
Monocytes Relative: 7 %
Neutro Abs: 4.8 10*3/uL (ref 1.4–6.5)
Neutrophils Relative %: 56 %
Platelets: 533 10*3/uL — ABNORMAL HIGH (ref 150–440)
RBC: 4.85 MIL/uL (ref 3.80–5.20)
RDW: 17.5 % — ABNORMAL HIGH (ref 11.5–14.5)
WBC: 8.5 10*3/uL (ref 3.6–11.0)

## 2017-01-21 LAB — COMPREHENSIVE METABOLIC PANEL
ALT: 18 U/L (ref 14–54)
AST: 20 U/L (ref 15–41)
Albumin: 3.4 g/dL — ABNORMAL LOW (ref 3.5–5.0)
Alkaline Phosphatase: 45 U/L (ref 38–126)
Anion gap: 6 (ref 5–15)
BUN: 10 mg/dL (ref 6–20)
CO2: 27 mmol/L (ref 22–32)
Calcium: 9 mg/dL (ref 8.9–10.3)
Chloride: 104 mmol/L (ref 101–111)
Creatinine, Ser: 0.92 mg/dL (ref 0.44–1.00)
GFR calc Af Amer: >60 mL/min (ref >60)
GFR calc non Af Amer: >60 mL/min (ref >60)
Glucose, Bld: 122 mg/dL — ABNORMAL HIGH (ref 65–99)
Potassium: 3.5 mmol/L (ref 3.5–5.1)
Sodium: 137 mmol/L (ref 135–145)
Total Bilirubin: 0.8 mg/dL (ref 0.3–1.2)
Total Protein: 8.1 g/dL (ref 6.5–8.1)

## 2017-01-21 NOTE — Progress Notes (Signed)
Patient here today as acute add on for pain in her left lower leg.  Patient states she is not experiencing any pain right now but when she walks she is in extreme pain.  Describes pain as "feeling like she has something hot in there".  Also complains of pain in right ribcage.  States she has been pulling up carpet at home.  Further complains of pain in left upper arm.  States she can hear something popping when she moves it.

## 2017-01-21 NOTE — Progress Notes (Signed)
[facility] day:  01/21/2017  Chief Complaint: Molly Perez is a 71 y.o. female with essential thrombocythemia (ET) on hydroxyurea who is seen for sick call visit secondary to acute left lower leg pain.  HPI:  The patient was last seen in the hematology clinic on 12/31/2016.  At that time, she had been on hydroxyurea for 10 days.  She was tolerating it well.  Platelet count was 538,000.  She was on a baby aspirin a day.  During the interim, she reports ongoing warmth in her left lower leg, that has been present since last 07/2016 when she saw her PCP.  Left lower extremity ultrasound on 12/01/2016 revealed no evidence of DVT.  She reports occasionally wearing a brace on her left knee when it "acts up".  She reports right lower back pain.    Past Medical History:  Diagnosis Date  . Cataract cortical, senile   . GERD (gastroesophageal reflux disease)   . Hypercholesterolemia   . Obesity   . Pre-diabetes   . Prediabetes   . Vitamin D deficiency      Past Surgical History:  Procedure Laterality Date  . ABDOMINAL HYSTERECTOMY    . COLONOSCOPY    . COLONOSCOPY WITH PROPOFOL N/A 07/04/2016   Procedure: COLONOSCOPY WITH PROPOFOL;  Surgeon: Lollie Sails, MD;  Location: [facility] ENDOSCOPY;  Service: Endoscopy;  Laterality: N/A;  . ESOPHAGOGASTRODUODENOSCOPY (EGD) WITH PROPOFOL N/A 07/04/2016   Procedure: ESOPHAGOGASTRODUODENOSCOPY (EGD) WITH PROPOFOL;  Surgeon: Lollie Sails, MD;  Location: [facility] ENDOSCOPY;  Service: Endoscopy;  Laterality: N/A;    Family History  Problem Relation Age of Onset  . Alzheimer's disease Father   . Congestive Heart Failure Mother   . Diabetes Mother   . Multiple sclerosis Brother   . Lupus Grandchild   . Stroke Grandchild   . Breast cancer Neg Hx     Social History:  reports that she has never smoked. Her smokeless tobacco use includes Snuff. She reports that she does not drink alcohol or use drugs.  She  lives in [place].  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  She retired at age 47.  She worked for [employer].  She denies any exposure to radiation or toxins.  She is concerned about finances and paying for new medications.  She is on a fixed income.  The patient is alone today.  Allergies:  Allergies  Allergen Reactions  . Amoxicillin Other (See Comments)  . Lipitor [Atorvastatin] Other (See Comments)    Weakness in legs Weakness in legs  . Penicillins Other (See Comments)    Pt states medication upsets her stomach    Current Medications: Current Outpatient Prescriptions  Medication Sig Dispense Refill  . acetaminophen (TYLENOL) 500 MG tablet Take by mouth.    Marland Kitchen aspirin EC 81 MG tablet Take 81 mg by mouth daily.    . Cholecalciferol (VITAMIN D3) 5000 UNITS CAPS Take 1 capsule by mouth daily.    Marland Kitchen ibuprofen (ADVIL,MOTRIN) 200 MG tablet Take 2 tablets by mouth every 4 (four) hours as needed.    . mupirocin ointment (BACTROBAN) 2 % Apply 1 application topically 3 (three) times daily. 22 g 0  . ondansetron (ZOFRAN) 8 MG tablet Take 1 tablet (8 mg total) by mouth every 8 (eight) hours as needed for nausea or vomiting. 20 tablet 0  . pantoprazole (PROTONIX) 40 MG tablet Take 40 mg by mouth daily.    Marland Kitchen  pravastatin (PRAVACHOL) 80 MG tablet Take by mouth.    . vitamin B-12 (CYANOCOBALAMIN) 1000 MCG tablet Take 1 tablet (1,000 mcg total) by mouth daily. 30 tablet 0  . hydroxyurea (HYDREA) 500 MG capsule TAKE ONE CAPSULE BY MOUTH EVERY DAY MAY TAKE WITH FOOD TO MINIMIZE GI SIDE EFFECTS 30 capsule 1   No current facility-administered medications for this visit.     Review of Systems:  GENERAL:  Feels "fine".  No fevers.  Sweats for years.  Weight up 5 pounds. PERFORMANCE STATUS (ECOG):  1 HEENT:  Cataracts.  No visual changes, runny nose, sore throat, mouth sores or tenderness. Lungs: Shortness of breath with exertion.  No cough.  No hemoptysis.  No  sleep apnea. Cardiac:  No chest pain, palpitations, orthopnea, or PND. GI:  Reflux and upset stomach (no change). Constipation managed with Miralax.  No nausea, vomiting, diarrhea, melena or hematochezia. GU:  No urgency, frequency, dysuria, or hematuria. Musculoskeletal:  Right knee issues following jump off a truck.  Left leg warmth/discomfort (unknown if swollen).  No muscle tenderness. Extremities:  Restless legs.  Left leg discomfort without trauma.  No apparent swelling. Skin:  No rashes or skin changes. Neuro:  No headache, numbness or weakness, balance or coordination issues. Endocrine:  Prediabetic.  No thyroid issues, hot flashes or night sweats. Psych:  No mood changes, depression or anxiety. Pain:  Left leg discomfort. Review of systems:  All other systems reviewed and found to be negative.  Physical Exam: Blood pressure (!) 143/84, pulse 80, temperature 97.4 F (36.3 C), temperature source Tympanic, resp. rate 18, weight 195 lb 1.7 oz (88.5 kg). GENERAL:  Well developed, well nourished, woman sitting comfortably in the exam room in no acute distress. MENTAL STATUS:  Alert and oriented to person, place and time. HEAD:  Long light brown hair pulled back.  Normocephalic, atraumatic, face symmetric, no Cushingoid features. EYES:  Glasses.  Brown eyes.  Pupils equal round and reactive to light and accomodation.  No conjunctivitis or scleral icterus. ENT:  Oropharynx clear without lesion.  Poor dentition.  Tongue normal.  Mucous membranes moist.  RESPIRATORY:  Clear to auscultation without rales, wheezes or rhonchi. CARDIOVASCULAR:  Regular rate and rhythm without murmur, rub or gallop. ABDOMEN:  Soft, non-tender, with active bowel sounds, and no hepatosplenomegaly.  No masses. SKIN:  No rashes, ulcers or lesions. EXTREMITIES: No edema, no skin discoloration or tenderness.  No palpable cords. LYMPH NODES: No palpable cervical, supraclavicular, axillary or inguinal adenopathy   NEUROLOGICAL: Unremarkable. PSYCH:  Appropriate.   Appointment on 01/21/2017  Component Date Value Ref Range Status  . WBC 01/21/2017 8.5  3.6 - 11.0 K/uL Final  . RBC 01/21/2017 4.85  3.80 - 5.20 MIL/uL Final  . Hemoglobin 01/21/2017 13.9  12.0 - 16.0 g/dL Final  . HCT 01/21/2017 42.5  35.0 - 47.0 % Final  . MCV 01/21/2017 87.6  80.0 - 100.0 fL Final  . MCH 01/21/2017 28.6  26.0 - 34.0 pg Final  . MCHC 01/21/2017 32.7  32.0 - 36.0 g/dL Final  . RDW 01/21/2017 17.5* 11.5 - 14.5 % Final  . Platelets 01/21/2017 533* 150 - 440 K/uL Final  . Neutrophils Relative % 01/21/2017 56  % Final  . Neutro Abs 01/21/2017 4.8  1.4 - 6.5 K/uL Final  . Lymphocytes Relative 01/21/2017 35  % Final  . Lymphs Abs 01/21/2017 3.0  1.0 - 3.6 K/uL Final  . Monocytes Relative 01/21/2017 7  % Final  . Monocytes Absolute  01/21/2017 0.6  0.2 - 0.9 K/uL Final  . Eosinophils Relative 01/21/2017 1  % Final  . Eosinophils Absolute 01/21/2017 0.1  0 - 0.7 K/uL Final  . Basophils Relative 01/21/2017 1  % Final  . Basophils Absolute 01/21/2017 0.1  0 - 0.1 K/uL Final  . Sodium 01/21/2017 137  135 - 145 mmol/L Final  . Potassium 01/21/2017 3.5  3.5 - 5.1 mmol/L Final  . Chloride 01/21/2017 104  101 - 111 mmol/L Final  . CO2 01/21/2017 27  22 - 32 mmol/L Final  . Glucose, Bld 01/21/2017 122* 65 - 99 mg/dL Final  . BUN 01/21/2017 10  6 - 20 mg/dL Final  . Creatinine, Ser 01/21/2017 0.92  0.44 - 1.00 mg/dL Final  . Calcium 01/21/2017 9.0  8.9 - 10.3 mg/dL Final  . Total Protein 01/21/2017 8.1  6.5 - 8.1 g/dL Final  . Albumin 01/21/2017 3.4* 3.5 - 5.0 g/dL Final  . AST 01/21/2017 20  15 - 41 U/L Final  . ALT 01/21/2017 18  14 - 54 U/L Final  . Alkaline Phosphatase 01/21/2017 45  38 - 126 U/L Final  . Total Bilirubin 01/21/2017 0.8  0.3 - 1.2 mg/dL Final  . GFR calc non Af Amer 01/21/2017 >60  >60 mL/min Final  . GFR calc Af Amer 01/21/2017 >60  >60 mL/min Final   Comment: (NOTE) The eGFR has been calculated using  the CKD EPI equation. This calculation has not been validated in all clinical situations. eGFR's persistently <60 mL/min signify possible Chronic Kidney Disease.   . Anion gap 01/21/2017 6  5 - 15 Final    Assessment:  Molly Perez is a 71 y.o. 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  She has a history of mild leukocytosis, thromobocytosis, and erythrocytosis.   Platelet count has ranged between 512,000 - 622,000 since 03/24/2016.  WBC has been mildly elevated (10,400 - 10,600).  Hematocrit was normal until 09/18/2016.  At that time, hematocrit was 50.7 with a hemoglobin of 16.6.  She is on a baby aspirin.  Work-up on 11/05/2016 revealed a hematocrit of 43.7, hemoglobin 14.3, platelets 583,00, WBC 9800 with an ANC of 6600.  Normal studies included:  Ferritin, iron studies, and erythropoietin level.  JAK2 was positive for V617F.  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  Marrow was normocellular to mildy hypercellular for age (40-50%) with trilineage hematopoiesis including a proliferation of atypical megakaryocytes with focal clusters and no increase in blasts.  There was no significant increase in marrow reticulin fibers.  Storage iron was present.  Cytogenetics were normal (58, XX).  Flow cytometry was negative.  FISH studies for MPN/CML were negative.  She is on hydroxyurea (began 12/21/2016).  She denies any side effects.  EGD on 07/04/2016 revealed reflux esophagitis, H pylori gastritis, and a small hiatal hernia.  H pylori was treated with metronidazole, clarithromycin, and omeprazole.  Colonoscopy on 07/04/2016 was normal.  Symptomatically, she reports increasing discomfort/warmth in her left lower leg.  Exam appears stable.  Platelet count is 533,000.  Plan: 1.  Labs today:  CBC with diff, CMP. 2.  Left lower extremity ultrasound today. 3.  Discuss labs  today.  Platelet count has decreased slightly.  Goal platelet count is 400,000.  4.  Continue hydroxyurea 500 mg po q day 5 days per week and 500 mg po BID 2 days per week. 5.  Continue baby aspirin  a day. 6.  Discuss patient's concern about finances (appointments and cost of medication).  Discuss f/u every 3 month clinic visits once counts are stable.  Medication assistance in place. Nurse to call [facility] to medication verify assistance. 7.  RTC as scheduled on 02/18/2017 for MD assessment and labs (CBC with diff, CMP).  Addendum:  Left lower extremity duplex on 01/26/2017 revealed no evidence of a DVT.   Lucendia Herrlich, NP  I saw and evaluated the patient, participating in the key portions of the service and reviewing pertinent diagnostic studies and records.  I reviewed the nurse practitioner's note and agree with the findings and the plan.  The assessment and plan were discussed with the patient.  Multiple questions were asked by the patient and answered.     Lequita Asal, MD  01/21/2017, 10:26 AM

## 2017-01-26 ENCOUNTER — Ambulatory Visit
Admission: RE | Admit: 2017-01-26 | Discharge: 2017-01-26 | Disposition: A | Discharge status: Home / Self Care | Payer: Medicare HMO | Source: Ambulatory Visit | Attending: Hematology and Oncology | Admitting: Hematology and Oncology

## 2017-01-26 DIAGNOSIS — D473 Essential (hemorrhagic) thrombocythemia: Secondary | ICD-10-CM

## 2017-01-26 DIAGNOSIS — M79605 Pain in left leg: Secondary | ICD-10-CM | POA: Insufficient documentation

## 2017-02-04 ENCOUNTER — Inpatient Hospital Stay: Payer: Medicare HMO | Attending: Hematology and Oncology

## 2017-02-04 DIAGNOSIS — R69 Illness, unspecified: Secondary | ICD-10-CM | POA: Diagnosis not present

## 2017-02-04 DIAGNOSIS — F17290 Nicotine dependence, other tobacco product, uncomplicated: Secondary | ICD-10-CM | POA: Diagnosis not present

## 2017-02-04 DIAGNOSIS — E669 Obesity, unspecified: Secondary | ICD-10-CM | POA: Insufficient documentation

## 2017-02-04 DIAGNOSIS — D473 Essential (hemorrhagic) thrombocythemia: Secondary | ICD-10-CM | POA: Diagnosis not present

## 2017-02-04 DIAGNOSIS — D72829 Elevated white blood cell count, unspecified: Secondary | ICD-10-CM | POA: Insufficient documentation

## 2017-02-04 DIAGNOSIS — Z7982 Long term (current) use of aspirin: Secondary | ICD-10-CM | POA: Diagnosis not present

## 2017-02-04 DIAGNOSIS — M79605 Pain in left leg: Secondary | ICD-10-CM | POA: Insufficient documentation

## 2017-02-04 DIAGNOSIS — E559 Vitamin D deficiency, unspecified: Secondary | ICD-10-CM | POA: Insufficient documentation

## 2017-02-04 DIAGNOSIS — E7800 Pure hypercholesterolemia, unspecified: Secondary | ICD-10-CM | POA: Insufficient documentation

## 2017-02-04 DIAGNOSIS — K210 Gastro-esophageal reflux disease with esophagitis: Secondary | ICD-10-CM | POA: Insufficient documentation

## 2017-02-04 DIAGNOSIS — Z79899 Other long term (current) drug therapy: Secondary | ICD-10-CM | POA: Insufficient documentation

## 2017-02-04 DIAGNOSIS — K449 Diaphragmatic hernia without obstruction or gangrene: Secondary | ICD-10-CM | POA: Insufficient documentation

## 2017-02-04 LAB — CBC WITH DIFFERENTIAL/PLATELET
Basophils Absolute: 0.1 10*3/uL (ref 0–0.1)
Basophils Relative: 1 %
Eosinophils Absolute: 0.1 10*3/uL (ref 0–0.7)
Eosinophils Relative: 1 %
HCT: 42.5 % (ref 35.0–47.0)
Hemoglobin: 13.9 g/dL (ref 12.0–16.0)
Lymphocytes Relative: 35 %
Lymphs Abs: 3 10*3/uL (ref 1.0–3.6)
MCH: 28.5 pg (ref 26.0–34.0)
MCHC: 32.7 g/dL (ref 32.0–36.0)
MCV: 87.3 fL (ref 80.0–100.0)
Monocytes Absolute: 0.8 10*3/uL (ref 0.2–0.9)
Monocytes Relative: 9 %
Neutro Abs: 4.8 10*3/uL (ref 1.4–6.5)
Neutrophils Relative %: 54 %
Platelets: 514 10*3/uL — ABNORMAL HIGH (ref 150–440)
RBC: 4.87 MIL/uL (ref 3.80–5.20)
RDW: 18.4 % — ABNORMAL HIGH (ref 11.5–14.5)
WBC: 8.8 10*3/uL (ref 3.6–11.0)

## 2017-02-11 DIAGNOSIS — M79622 Pain in left upper arm: Secondary | ICD-10-CM | POA: Diagnosis not present

## 2017-02-11 DIAGNOSIS — Z79899 Other long term (current) drug therapy: Secondary | ICD-10-CM | POA: Diagnosis not present

## 2017-02-11 DIAGNOSIS — D473 Essential (hemorrhagic) thrombocythemia: Secondary | ICD-10-CM | POA: Diagnosis not present

## 2017-02-11 DIAGNOSIS — R030 Elevated blood-pressure reading, without diagnosis of hypertension: Secondary | ICD-10-CM | POA: Diagnosis not present

## 2017-02-11 DIAGNOSIS — H259 Unspecified age-related cataract: Secondary | ICD-10-CM | POA: Diagnosis not present

## 2017-02-11 DIAGNOSIS — E559 Vitamin D deficiency, unspecified: Secondary | ICD-10-CM | POA: Diagnosis not present

## 2017-02-11 DIAGNOSIS — M79621 Pain in right upper arm: Secondary | ICD-10-CM | POA: Diagnosis not present

## 2017-02-11 DIAGNOSIS — K219 Gastro-esophageal reflux disease without esophagitis: Secondary | ICD-10-CM | POA: Diagnosis not present

## 2017-02-11 DIAGNOSIS — E669 Obesity, unspecified: Secondary | ICD-10-CM | POA: Diagnosis not present

## 2017-02-11 DIAGNOSIS — E7800 Pure hypercholesterolemia, unspecified: Secondary | ICD-10-CM | POA: Diagnosis not present

## 2017-02-11 DIAGNOSIS — Z0000 Encounter for general adult medical examination without abnormal findings: Secondary | ICD-10-CM | POA: Diagnosis not present

## 2017-02-12 ENCOUNTER — Other Ambulatory Visit: Payer: Self-pay | Admitting: Hematology and Oncology

## 2017-02-12 DIAGNOSIS — D473 Essential (hemorrhagic) thrombocythemia: Secondary | ICD-10-CM

## 2017-02-18 ENCOUNTER — Encounter: Payer: Self-pay | Admitting: Hematology and Oncology

## 2017-02-18 ENCOUNTER — Inpatient Hospital Stay (HOSPITAL_BASED_OUTPATIENT_CLINIC_OR_DEPARTMENT_OTHER): Payer: Medicare HMO | Admitting: Hematology and Oncology

## 2017-02-18 ENCOUNTER — Ambulatory Visit: Payer: Medicare HMO | Admitting: Hematology and Oncology

## 2017-02-18 ENCOUNTER — Inpatient Hospital Stay: Payer: Medicare HMO

## 2017-02-18 VITALS — BP 139/82 | HR 69 | Temp 97.0°F | Resp 18 | Wt 197.5 lb

## 2017-02-18 DIAGNOSIS — K449 Diaphragmatic hernia without obstruction or gangrene: Secondary | ICD-10-CM | POA: Diagnosis not present

## 2017-02-18 DIAGNOSIS — E559 Vitamin D deficiency, unspecified: Secondary | ICD-10-CM

## 2017-02-18 DIAGNOSIS — M79605 Pain in left leg: Secondary | ICD-10-CM

## 2017-02-18 DIAGNOSIS — K210 Gastro-esophageal reflux disease with esophagitis: Secondary | ICD-10-CM | POA: Diagnosis not present

## 2017-02-18 DIAGNOSIS — D72829 Elevated white blood cell count, unspecified: Secondary | ICD-10-CM | POA: Diagnosis not present

## 2017-02-18 DIAGNOSIS — D473 Essential (hemorrhagic) thrombocythemia: Secondary | ICD-10-CM | POA: Diagnosis not present

## 2017-02-18 DIAGNOSIS — Z79899 Other long term (current) drug therapy: Secondary | ICD-10-CM

## 2017-02-18 DIAGNOSIS — R69 Illness, unspecified: Secondary | ICD-10-CM | POA: Diagnosis not present

## 2017-02-18 DIAGNOSIS — E7800 Pure hypercholesterolemia, unspecified: Secondary | ICD-10-CM

## 2017-02-18 DIAGNOSIS — E669 Obesity, unspecified: Secondary | ICD-10-CM

## 2017-02-18 DIAGNOSIS — F17290 Nicotine dependence, other tobacco product, uncomplicated: Secondary | ICD-10-CM

## 2017-02-18 DIAGNOSIS — Z7982 Long term (current) use of aspirin: Secondary | ICD-10-CM | POA: Diagnosis not present

## 2017-02-18 LAB — COMPREHENSIVE METABOLIC PANEL
ALT: 14 U/L (ref 14–54)
AST: 16 U/L (ref 15–41)
Albumin: 3.4 g/dL — ABNORMAL LOW (ref 3.5–5.0)
Alkaline Phosphatase: 43 U/L (ref 38–126)
Anion gap: 4 — ABNORMAL LOW (ref 5–15)
BUN: 12 mg/dL (ref 6–20)
CO2: 28 mmol/L (ref 22–32)
Calcium: 8.8 mg/dL — ABNORMAL LOW (ref 8.9–10.3)
Chloride: 102 mmol/L (ref 101–111)
Creatinine, Ser: 0.82 mg/dL (ref 0.44–1.00)
GFR calc Af Amer: >60 mL/min (ref >60)
GFR calc non Af Amer: >60 mL/min (ref >60)
Glucose, Bld: 90 mg/dL (ref 65–99)
Potassium: 3.9 mmol/L (ref 3.5–5.1)
Sodium: 134 mmol/L — ABNORMAL LOW (ref 135–145)
Total Bilirubin: 0.6 mg/dL (ref 0.3–1.2)
Total Protein: 7.5 g/dL (ref 6.5–8.1)

## 2017-02-18 LAB — CBC WITH DIFFERENTIAL/PLATELET
Basophils Absolute: 0.1 10*3/uL (ref 0–0.1)
Basophils Relative: 1 %
Eosinophils Absolute: 0.1 10*3/uL (ref 0–0.7)
Eosinophils Relative: 1 %
HCT: 41.2 % (ref 35.0–47.0)
Hemoglobin: 13.5 g/dL (ref 12.0–16.0)
Lymphocytes Relative: 31 %
Lymphs Abs: 2.5 10*3/uL (ref 1.0–3.6)
MCH: 29.1 pg (ref 26.0–34.0)
MCHC: 32.8 g/dL (ref 32.0–36.0)
MCV: 88.8 fL (ref 80.0–100.0)
Monocytes Absolute: 0.9 10*3/uL (ref 0.2–0.9)
Monocytes Relative: 10 %
Neutro Abs: 4.7 10*3/uL (ref 1.4–6.5)
Neutrophils Relative %: 57 %
Platelets: 476 10*3/uL — ABNORMAL HIGH (ref 150–440)
RBC: 4.64 MIL/uL (ref 3.80–5.20)
RDW: 19 % — ABNORMAL HIGH (ref 11.5–14.5)
WBC: 8.2 10*3/uL (ref 3.6–11.0)

## 2017-02-18 NOTE — Progress Notes (Signed)
Patient states she continues to have pain in her left arm at times and also in her legs.

## 2017-02-18 NOTE — Progress Notes (Signed)
[facility] day:  02/18/2017   Chief Complaint: Molly Perez is a 71 y.o. 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  HPI:  The patient was last seen in the hematology clinic on 01/21/2017.  At that time, she was seen for sick call visit.  She had pain in her left leg.  Duplex revealed no evidence of DVT.  She was taking her hydroxyurea and a baby aspirin a day.  Platelet count was 533,000.  CBC on 02/04/2017 revealed a hematocrit of 42.5, hemoglobin 13.9, MCV 87.3, platelets 514,000, WBC 8800 with an ANC of 4800.  During the interim, she notes that her leg is feeling fine. She has a bad right knee. She is taking hydroxyurea 500 mg 6 days a week and 1000 mg on Saturdays.   Past Medical History:  Diagnosis Date  . Cataract cortical, senile   . GERD (gastroesophageal reflux disease)   . Hypercholesterolemia   . Obesity   . Pre-diabetes   . Prediabetes   . Vitamin D deficiency      Past Surgical History:  Procedure Laterality Date  . ABDOMINAL HYSTERECTOMY    . COLONOSCOPY    . COLONOSCOPY WITH PROPOFOL N/A 07/04/2016   Procedure: COLONOSCOPY WITH PROPOFOL;  Surgeon: Lollie Sails, MD;  Location: [facility] ENDOSCOPY;  Service: Endoscopy;  Laterality: N/A;  . ESOPHAGOGASTRODUODENOSCOPY (EGD) WITH PROPOFOL N/A 07/04/2016   Procedure: ESOPHAGOGASTRODUODENOSCOPY (EGD) WITH PROPOFOL;  Surgeon: Lollie Sails, MD;  Location: [facility] ENDOSCOPY;  Service: Endoscopy;  Laterality: N/A;    Family History  Problem Relation Age of Onset  . Alzheimer's disease Father   . Congestive Heart Failure Mother   . Diabetes Mother   . Multiple sclerosis Brother   . Lupus Grandchild   . Stroke Grandchild   . Breast cancer Neg Hx     Social History:  reports that she has never smoked. Her smokeless tobacco use includes Snuff. She reports that she does not drink alcohol or use drugs.  She lives in [place].   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  She retired at age 56.  She worked for [employer].  She denies any exposure to radiation or toxins.  She is concerned about finances and paying for new medications.  She is on a fixed income.  The patient is alone today.  Allergies:  Allergies  Allergen Reactions  . Amoxicillin Other (See Comments)  . Lipitor [Atorvastatin] Other (See Comments)    Weakness in legs Weakness in legs  . Penicillins Other (See Comments)    Pt states medication upsets her stomach    Current Medications: Current Outpatient Prescriptions  Medication Sig Dispense Refill  . acetaminophen (TYLENOL) 500 MG tablet Take by mouth.    Marland Kitchen aspirin EC 81 MG tablet Take 81 mg by mouth daily.    . Cholecalciferol (VITAMIN D3) 5000 UNITS CAPS Take 1 capsule by mouth daily.    . hydroxyurea (HYDREA) 500 MG capsule TAKE ONE CAPSULE BY MOUTH EVERY DAY MAY TAKE WITH FOOD TO MINIMIZE GI SIDE EFFECTS 30 capsule 1  . ibuprofen (ADVIL,MOTRIN) 200 MG tablet Take 2 tablets by mouth every 4 (four) hours as needed.    . mupirocin ointment (BACTROBAN) 2 % Apply 1 application topically 3 (three) times daily. 22 g 0  . ondansetron (ZOFRAN) 8 MG tablet Take 1 tablet (8 mg total) by mouth every 8 (eight)  hours as needed for nausea or vomiting. 20 tablet 0  . pantoprazole (PROTONIX) 40 MG tablet Take 40 mg by mouth daily.    . pravastatin (PRAVACHOL) 80 MG tablet Take by mouth.    . vitamin B-12 (CYANOCOBALAMIN) 1000 MCG tablet Take 1 tablet (1,000 mcg total) by mouth daily. 30 tablet 0   No current facility-administered medications for this visit.     Review of Systems:  GENERAL:  Feels "good".  No fevers.  Sweats for years.  Weight up 2 pounds. PERFORMANCE STATUS (ECOG):  1 HEENT:  Cataracts.  No visual changes, runny nose, sore throat, mouth sores or tenderness. Lungs: Shortness of breath with exertion.  No cough.  No hemoptysis.  No sleep apnea. Cardiac:   No chest pain, palpitations, orthopnea, or PND. GI:  Reflux. Constipation managed with Miralax.  No nausea, vomiting, diarrhea, melena or hematochezia. GU:  No urgency, frequency, dysuria, or hematuria. Musculoskeletal:  Right knee issues.  No bone pain.  No muscle tenderness. Extremities:  Restless legs.  Left leg discomfort without trauma.  No apparent swelling. Skin:  No rashes or skin changes. Neuro:  No headache, numbness or weakness, balance or coordination issues. Endocrine:  Prediabetic.  No thyroid issues, hot flashes or night sweats. Psych:  No mood changes, depression or anxiety. Pain:  No pain. Review of systems:  All other systems reviewed and found to be negative.  Physical Exam: Blood pressure 139/82, pulse 69, temperature 97 F (36.1 C), temperature source Tympanic, resp. rate 18, weight 197 lb 8.5 oz (89.6 kg). GENERAL:  Well developed, well nourished, woman sitting comfortably in the exam room in no acute distress. MENTAL STATUS:  Alert and oriented to person, place and time. HEAD:  Long light brown hair pulled back.  Normocephalic, atraumatic, face symmetric, no Cushingoid features. EYES:  Glasses.  Brown eyes.  Pupils equal round and reactive to light and accomodation.  No conjunctivitis or scleral icterus. ENT:  Oropharynx clear without lesion.  Poor dentition.  Tongue normal.  Mucous membranes moist.  RESPIRATORY:  Clear to auscultation without rales, wheezes or rhonchi. CARDIOVASCULAR:  Regular rate and rhythm without murmur, rub or gallop. ABDOMEN:  Soft, non-tender, with active bowel sounds, and no hepatosplenomegaly.  No masses. SKIN:  No rashes, ulcers or lesions. EXTREMITIES: No edema, no skin discoloration or tenderness.  No palpable cords. LYMPH NODES: No palpable cervical, supraclavicular, axillary or inguinal adenopathy  NEUROLOGICAL: Unremarkable. PSYCH:  Appropriate.   Appointment on 02/18/2017  Component Date Value Ref Range Status  . WBC 02/18/2017  8.2  3.6 - 11.0 K/uL Final  . RBC 02/18/2017 4.64  3.80 - 5.20 MIL/uL Final  . Hemoglobin 02/18/2017 13.5  12.0 - 16.0 g/dL Final  . HCT 02/18/2017 41.2  35.0 - 47.0 % Final  . MCV 02/18/2017 88.8  80.0 - 100.0 fL Final  . MCH 02/18/2017 29.1  26.0 - 34.0 pg Final  . MCHC 02/18/2017 32.8  32.0 - 36.0 g/dL Final  . RDW 02/18/2017 19.0* 11.5 - 14.5 % Final  . Platelets 02/18/2017 476* 150 - 440 K/uL Final  . Neutrophils Relative % 02/18/2017 57  % Final  . Neutro Abs 02/18/2017 4.7  1.4 - 6.5 K/uL Final  . Lymphocytes Relative 02/18/2017 31  % Final  . Lymphs Abs 02/18/2017 2.5  1.0 - 3.6 K/uL Final  . Monocytes Relative 02/18/2017 10  % Final  . Monocytes Absolute 02/18/2017 0.9  0.2 - 0.9 K/uL Final  . Eosinophils Relative 02/18/2017 1  %  Final  . Eosinophils Absolute 02/18/2017 0.1  0 - 0.7 K/uL Final  . Basophils Relative 02/18/2017 1  % Final  . Basophils Absolute 02/18/2017 0.1  0 - 0.1 K/uL Final    Assessment:  Syliva Mee is a 71 y.o. 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  She has a history of mild leukocytosis, thromobocytosis, and erythrocytosis.   Platelet count has ranged between 512,000 - 622,000 since 03/24/2016.  WBC has been mildly elevated (10,400 - 10,600).  Hematocrit was normal until 09/18/2016.  At that time, hematocrit was 50.7 with a hemoglobin of 16.6.  She is on a baby aspirin.  Work-up on 11/05/2016 revealed a hematocrit of 43.7, hemoglobin 14.3, platelets 583,00, WBC 9800 with an ANC of 6600.  Normal studies included:  Ferritin, iron studies, and erythropoietin level.  JAK2 was positive for V617F.  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  Marrow was normocellular to mildy hypercellular for age (40-50%) with trilineage hematopoiesis including a proliferation of atypical megakaryocytes with focal clusters and no increase in blasts.   There was no significant increase in marrow reticulin fibers.  Storage iron was present.  Cytogenetics were normal (84, XX).  Flow cytometry was negative.  FISH studies for MPN/CML were negative.  She is on hydroxyurea (began 12/21/2016).  She is taking 1 tablet 6 days/week and 2 tablets 1 day a week (total 8 tablets/week).  She denies any side effects.  Left lower extremity duplex on 01/26/2017 revealed no evidence of a DVT.  EGD on 07/04/2016 revealed reflux esophagitis, H pylori gastritis, and a small hiatal hernia.  H pylori was treated with metronidazole, clarithromycin, and omeprazole.  Colonoscopy on 07/04/2016 was normal.  Symptomatically, she is doing well.  Exam is stable.  Platelet count is 476,000.  Plan: 1.  Labs today:  CBC with diff, CMP. 2.  Discuss labs today.  Goal platelet count is 400,000.  3.  Discuss hydroxyurea 500 mg po q day 5 days per week and 500 mg po BID 2 days per week. 4.  Continue baby aspirin a day. 5.  RTC in 1 month for labs (CBC with diff, CMP). 6.  RTC in 2 months for MD assessment and labs (CBC with diff, CMP).   Lequita Asal, MD  02/18/2017, 10:49 AM

## 2017-02-23 ENCOUNTER — Other Ambulatory Visit: Payer: Self-pay | Admitting: *Deleted

## 2017-02-23 DIAGNOSIS — D473 Essential (hemorrhagic) thrombocythemia: Secondary | ICD-10-CM

## 2017-02-23 MED ORDER — HYDROXYUREA 500 MG PO CAPS: ORAL_CAPSULE | ORAL | 0 refills | Status: DC

## 2017-02-23 NOTE — Telephone Encounter (Signed)
Ing for a 90 prescription to be sent to [facility] and that [facility] and [facility] be removed form her list of pharmacies

## 2017-03-18 ENCOUNTER — Telehealth: Payer: Self-pay | Admitting: *Deleted

## 2017-03-18 ENCOUNTER — Inpatient Hospital Stay: Payer: Medicare HMO | Attending: Hematology and Oncology

## 2017-03-18 DIAGNOSIS — D693 Immune thrombocytopenic purpura: Secondary | ICD-10-CM | POA: Insufficient documentation

## 2017-03-18 DIAGNOSIS — D473 Essential (hemorrhagic) thrombocythemia: Secondary | ICD-10-CM

## 2017-03-18 LAB — COMPREHENSIVE METABOLIC PANEL
ALT: 14 U/L (ref 14–54)
AST: 20 U/L (ref 15–41)
Albumin: 3.3 g/dL — ABNORMAL LOW (ref 3.5–5.0)
Alkaline Phosphatase: 40 U/L (ref 38–126)
Anion gap: 6 (ref 5–15)
BUN: 10 mg/dL (ref 6–20)
CO2: 26 mmol/L (ref 22–32)
Calcium: 8.8 mg/dL — ABNORMAL LOW (ref 8.9–10.3)
Chloride: 104 mmol/L (ref 101–111)
Creatinine, Ser: 0.97 mg/dL (ref 0.44–1.00)
GFR calc Af Amer: >60 mL/min (ref >60)
GFR calc non Af Amer: 57 mL/min — ABNORMAL LOW (ref >60)
Glucose, Bld: 98 mg/dL (ref 65–99)
Potassium: 3.5 mmol/L (ref 3.5–5.1)
Sodium: 136 mmol/L (ref 135–145)
Total Bilirubin: 0.5 mg/dL (ref 0.3–1.2)
Total Protein: 7.6 g/dL (ref 6.5–8.1)

## 2017-03-18 LAB — CBC WITH DIFFERENTIAL/PLATELET
Basophils Absolute: 0.1 10*3/uL (ref 0–0.1)
Basophils Relative: 1 %
Eosinophils Absolute: 0.1 10*3/uL (ref 0–0.7)
Eosinophils Relative: 1 %
HCT: 40.5 % (ref 35.0–47.0)
Hemoglobin: 13.4 g/dL (ref 12.0–16.0)
Lymphocytes Relative: 32 %
Lymphs Abs: 2.5 10*3/uL (ref 1.0–3.6)
MCH: 30.1 pg (ref 26.0–34.0)
MCHC: 33.1 g/dL (ref 32.0–36.0)
MCV: 91.1 fL (ref 80.0–100.0)
Monocytes Absolute: 0.7 10*3/uL (ref 0.2–0.9)
Monocytes Relative: 9 %
Neutro Abs: 4.3 10*3/uL (ref 1.4–6.5)
Neutrophils Relative %: 57 %
Platelets: 464 10*3/uL — ABNORMAL HIGH (ref 150–440)
RBC: 4.44 MIL/uL (ref 3.80–5.20)
RDW: 20.2 % — ABNORMAL HIGH (ref 11.5–14.5)
WBC: 7.6 10*3/uL (ref 3.6–11.0)

## 2017-03-18 NOTE — Telephone Encounter (Signed)
Called patient and she reported that she takes hydroxurea 500 mg on sun, mon, tues, thurs and fri and 1000 mg on wed and Sunday.

## 2017-03-23 DIAGNOSIS — R921 Mammographic calcification found on diagnostic imaging of breast: Secondary | ICD-10-CM | POA: Insufficient documentation

## 2017-03-26 ENCOUNTER — Other Ambulatory Visit: Payer: Self-pay | Admitting: Pediatrics

## 2017-03-26 DIAGNOSIS — R921 Mammographic calcification found on diagnostic imaging of breast: Secondary | ICD-10-CM

## 2017-04-07 DIAGNOSIS — R5383 Other fatigue: Secondary | ICD-10-CM | POA: Diagnosis not present

## 2017-04-07 DIAGNOSIS — D473 Essential (hemorrhagic) thrombocythemia: Secondary | ICD-10-CM | POA: Diagnosis not present

## 2017-04-07 DIAGNOSIS — E7800 Pure hypercholesterolemia, unspecified: Secondary | ICD-10-CM | POA: Diagnosis not present

## 2017-04-07 DIAGNOSIS — R921 Mammographic calcification found on diagnostic imaging of breast: Secondary | ICD-10-CM | POA: Diagnosis not present

## 2017-04-07 DIAGNOSIS — R7303 Prediabetes: Secondary | ICD-10-CM | POA: Diagnosis not present

## 2017-04-07 DIAGNOSIS — K219 Gastro-esophageal reflux disease without esophagitis: Secondary | ICD-10-CM | POA: Diagnosis not present

## 2017-04-07 DIAGNOSIS — Z0000 Encounter for general adult medical examination without abnormal findings: Secondary | ICD-10-CM | POA: Diagnosis not present

## 2017-04-07 DIAGNOSIS — R1319 Other dysphagia: Secondary | ICD-10-CM | POA: Diagnosis not present

## 2017-04-07 DIAGNOSIS — Z23 Encounter for immunization: Secondary | ICD-10-CM | POA: Diagnosis not present

## 2017-04-07 DIAGNOSIS — E559 Vitamin D deficiency, unspecified: Secondary | ICD-10-CM | POA: Diagnosis not present

## 2017-04-15 ENCOUNTER — Encounter: Payer: Self-pay | Admitting: Hematology and Oncology

## 2017-04-15 ENCOUNTER — Inpatient Hospital Stay: Payer: Medicare HMO | Attending: Hematology and Oncology | Admitting: Hematology and Oncology

## 2017-04-15 ENCOUNTER — Inpatient Hospital Stay: Payer: Medicare HMO

## 2017-04-15 VITALS — BP 139/84 | HR 70 | Temp 97.5°F | Resp 18 | Wt 199.5 lb

## 2017-04-15 DIAGNOSIS — K219 Gastro-esophageal reflux disease without esophagitis: Secondary | ICD-10-CM | POA: Diagnosis not present

## 2017-04-15 DIAGNOSIS — D473 Essential (hemorrhagic) thrombocythemia: Secondary | ICD-10-CM

## 2017-04-15 DIAGNOSIS — Z79899 Other long term (current) drug therapy: Secondary | ICD-10-CM | POA: Diagnosis not present

## 2017-04-15 DIAGNOSIS — Z7982 Long term (current) use of aspirin: Secondary | ICD-10-CM | POA: Insufficient documentation

## 2017-04-15 DIAGNOSIS — E669 Obesity, unspecified: Secondary | ICD-10-CM | POA: Diagnosis not present

## 2017-04-15 DIAGNOSIS — R7303 Prediabetes: Secondary | ICD-10-CM | POA: Diagnosis not present

## 2017-04-15 DIAGNOSIS — M79602 Pain in left arm: Secondary | ICD-10-CM

## 2017-04-15 DIAGNOSIS — E7800 Pure hypercholesterolemia, unspecified: Secondary | ICD-10-CM | POA: Diagnosis not present

## 2017-04-15 DIAGNOSIS — E559 Vitamin D deficiency, unspecified: Secondary | ICD-10-CM | POA: Diagnosis not present

## 2017-04-15 DIAGNOSIS — F17290 Nicotine dependence, other tobacco product, uncomplicated: Secondary | ICD-10-CM | POA: Diagnosis not present

## 2017-04-15 DIAGNOSIS — R69 Illness, unspecified: Secondary | ICD-10-CM | POA: Diagnosis not present

## 2017-04-15 LAB — COMPREHENSIVE METABOLIC PANEL
ALT: 13 U/L — ABNORMAL LOW (ref 14–54)
AST: 17 U/L (ref 15–41)
Albumin: 3.4 g/dL — ABNORMAL LOW (ref 3.5–5.0)
Alkaline Phosphatase: 38 U/L (ref 38–126)
Anion gap: 5 (ref 5–15)
BUN: 9 mg/dL (ref 6–20)
CO2: 27 mmol/L (ref 22–32)
Calcium: 8.8 mg/dL — ABNORMAL LOW (ref 8.9–10.3)
Chloride: 104 mmol/L (ref 101–111)
Creatinine, Ser: 0.91 mg/dL (ref 0.44–1.00)
GFR calc Af Amer: >60 mL/min (ref >60)
GFR calc non Af Amer: >60 mL/min (ref >60)
Glucose, Bld: 83 mg/dL (ref 65–99)
Potassium: 3.9 mmol/L (ref 3.5–5.1)
Sodium: 136 mmol/L (ref 135–145)
Total Bilirubin: 0.6 mg/dL (ref 0.3–1.2)
Total Protein: 7.6 g/dL (ref 6.5–8.1)

## 2017-04-15 LAB — CBC WITH DIFFERENTIAL/PLATELET
Basophils Absolute: 0.1 10*3/uL (ref 0–0.1)
Basophils Relative: 1 %
Eosinophils Absolute: 0.1 10*3/uL (ref 0–0.7)
Eosinophils Relative: 1 %
HCT: 40.9 % (ref 35.0–47.0)
Hemoglobin: 13.5 g/dL (ref 12.0–16.0)
Lymphocytes Relative: 36 %
Lymphs Abs: 2.9 10*3/uL (ref 1.0–3.6)
MCH: 31 pg (ref 26.0–34.0)
MCHC: 32.9 g/dL (ref 32.0–36.0)
MCV: 94.3 fL (ref 80.0–100.0)
Monocytes Absolute: 1 10*3/uL — ABNORMAL HIGH (ref 0.2–0.9)
Monocytes Relative: 13 %
Neutro Abs: 4 10*3/uL (ref 1.4–6.5)
Neutrophils Relative %: 49 %
Platelets: 469 10*3/uL — ABNORMAL HIGH (ref 150–440)
RBC: 4.34 MIL/uL (ref 3.80–5.20)
RDW: 19.1 % — ABNORMAL HIGH (ref 11.5–14.5)
WBC: 8.1 10*3/uL (ref 3.6–11.0)

## 2017-04-15 NOTE — Progress Notes (Signed)
[facility] day:  04/15/2017   Chief Complaint: Molly Perez is a 71 y.o. 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  HPI:  The patient was last seen in the hematology clinic on 02/18/2017.  At that time, she was doing well.  Exam was stable.  Platelet count was 476,000.  CBC on 03/18/2017 revealed a hematocrit of 40.5, hemoglobin 13.4, MCV 91.1, platelets 464,000, WBC 7600 with an Deport of 4300.  During the interim, she has had "no complaints".  She states "everyone has pain".  Her left arm bothers her.  She denies any trauma.  She is taking hydroxyurea one/day on Sundays, Mondays, Tuesdays, Thursdays and Fridays and two/day on Wednesdays and Saturdays (total weekly dose 9 pills).   Past Medical History:  Diagnosis Date  . Cataract cortical, senile   . GERD (gastroesophageal reflux disease)   . Hypercholesterolemia   . Obesity   . Pre-diabetes   . Prediabetes   . Vitamin D deficiency      Past Surgical History:  Procedure Laterality Date  . ABDOMINAL HYSTERECTOMY    . COLONOSCOPY    . COLONOSCOPY WITH PROPOFOL N/A 07/04/2016   Procedure: COLONOSCOPY WITH PROPOFOL;  Surgeon: Lollie Sails, MD;  Location: [facility] ENDOSCOPY;  Service: Endoscopy;  Laterality: N/A;  . ESOPHAGOGASTRODUODENOSCOPY (EGD) WITH PROPOFOL N/A 07/04/2016   Procedure: ESOPHAGOGASTRODUODENOSCOPY (EGD) WITH PROPOFOL;  Surgeon: Lollie Sails, MD;  Location: [facility] ENDOSCOPY;  Service: Endoscopy;  Laterality: N/A;    Family History  Problem Relation Age of Onset  . Alzheimer's disease Father   . Congestive Heart Failure Mother   . Diabetes Mother   . Multiple sclerosis Brother   . Lupus Grandchild   . Stroke Grandchild   . Breast cancer Neg Hx     Social History:  reports that she has never smoked. Her smokeless tobacco use includes Snuff. She reports that she does not drink alcohol or use drugs.  She lives in  [place].  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  She worked for [employer].  She denies any exposure to radiation or toxins.  She is concerned about finances and paying for new medications.  She is on a fixed income.  The patient is alone today.  Allergies:  Allergies  Allergen Reactions  . Amoxicillin Other (See Comments)  . Lipitor [Atorvastatin] Other (See Comments)    Weakness in legs Weakness in legs  . Penicillins Other (See Comments)    Pt states medication upsets her stomach    Current Medications: Current Outpatient Prescriptions  Medication Sig Dispense Refill  . acetaminophen (TYLENOL) 500 MG tablet Take by mouth.    Marland Kitchen aspirin EC 81 MG tablet Take 81 mg by mouth daily.    . Cholecalciferol (VITAMIN D3) 5000 UNITS CAPS Take 1 capsule by mouth daily.    . hydroxyurea (HYDREA) 500 MG capsule Take 1 cap (500 mg)Sun, Mon, Tues, Thurs, and Friday and take 2 caps (1000 mg)on Wed and Sat. 96 capsule 0  . ibuprofen (ADVIL,MOTRIN) 200 MG tablet Take 2 tablets by mouth every 4 (four) hours as needed.    . mupirocin ointment (BACTROBAN) 2 % Apply 1 application topically 3 (three) times daily. 22 g 0  . ondansetron (ZOFRAN) 8 MG tablet Take 1 tablet (8 mg total) by mouth every 8 (eight) hours as needed for nausea or  vomiting. 20 tablet 0  . pantoprazole (PROTONIX) 40 MG tablet Take 40 mg by mouth daily.    . pravastatin (PRAVACHOL) 80 MG tablet Take by mouth.    . vitamin B-12 (CYANOCOBALAMIN) 1000 MCG tablet Take 1 tablet (1,000 mcg total) by mouth daily. 30 tablet 0   No current facility-administered medications for this visit.     Review of Systems:  GENERAL:  "No complaints".  No fevers.  Sweats for years.  Weight up 2 pounds. PERFORMANCE STATUS (ECOG):  1 HEENT:  Cataracts.  No visual changes, runny nose, sore throat, mouth sores or tenderness. Lungs: Shortness of breath with exertion.  No cough.  No hemoptysis.  No  sleep apnea. Cardiac:  No chest pain, palpitations, orthopnea, or PND. GI:  Reflux. Constipation managed with Miralax.  No nausea, vomiting, diarrhea, melena or hematochezia. GU:  No urgency, frequency, dysuria, or hematuria. Musculoskeletal:  Right knee issues.  Left arms bothers her.  No bone pain.  No muscle tenderness. Extremities:  Restless legs.  Left leg discomfort without trauma.  No apparent swelling. Skin:  No rashes or skin changes. Neuro:  No headache, numbness or weakness, balance or coordination issues. Endocrine:  Prediabetic.  No thyroid issues, hot flashes or night sweats. Psych:  No mood changes, depression or anxiety. Pain:  No pain. Review of systems:  All other systems reviewed and found to be negative.  Physical Exam: Blood pressure 139/84, pulse 70, temperature 97.5 F (36.4 C), temperature source Tympanic, resp. rate 18, weight 199 lb 8.3 oz (90.5 kg). GENERAL:  Well developed, well nourished, woman sitting comfortably in the exam room in no acute distress. MENTAL STATUS:  Alert and oriented to person, place and time. HEAD:  Long light brown hair in a ponytail.  Normocephalic, atraumatic, face symmetric, no Cushingoid features. EYES:  Glasses.  Brown eyes.  Pupils equal round and reactive to light and accomodation.  No conjunctivitis or scleral icterus. ENT:  Oropharynx clear without lesion.  Poor dentition.  Tongue normal.  Mucous membranes moist.  RESPIRATORY:  Clear to auscultation without rales, wheezes or rhonchi. CARDIOVASCULAR:  Regular rate and rhythm without murmur, rub or gallop. ABDOMEN:  Soft, non-tender, with active bowel sounds, and no hepatosplenomegaly.  No masses. SKIN:  No rashes, ulcers or lesions. EXTREMITIES: No edema, no skin discoloration or tenderness.  No palpable cords. LYMPH NODES: No palpable cervical, supraclavicular, axillary or inguinal adenopathy  NEUROLOGICAL: Unremarkable. PSYCH:  Appropriate.   Appointment on 04/15/2017   Component Date Value Ref Range Status  . Sodium 04/15/2017 136  135 - 145 mmol/L Final  . Potassium 04/15/2017 3.9  3.5 - 5.1 mmol/L Final  . Chloride 04/15/2017 104  101 - 111 mmol/L Final  . CO2 04/15/2017 27  22 - 32 mmol/L Final  . Glucose, Bld 04/15/2017 83  65 - 99 mg/dL Final  . BUN 04/15/2017 9  6 - 20 mg/dL Final  . Creatinine, Ser 04/15/2017 0.91  0.44 - 1.00 mg/dL Final  . Calcium 04/15/2017 8.8* 8.9 - 10.3 mg/dL Final  . Total Protein 04/15/2017 7.6  6.5 - 8.1 g/dL Final  . Albumin 04/15/2017 3.4* 3.5 - 5.0 g/dL Final  . AST 04/15/2017 17  15 - 41 U/L Final  . ALT 04/15/2017 13* 14 - 54 U/L Final  . Alkaline Phosphatase 04/15/2017 38  38 - 126 U/L Final  . Total Bilirubin 04/15/2017 0.6  0.3 - 1.2 mg/dL Final  . GFR calc non Af Amer 04/15/2017 >60  >60  mL/min Final  . GFR calc Af Amer 04/15/2017 >60  >60 mL/min Final   Comment: (NOTE) The eGFR has been calculated using the CKD EPI equation. This calculation has not been validated in all clinical situations. eGFR's persistently <60 mL/min signify possible Chronic Kidney Disease.   . Anion gap 04/15/2017 5  5 - 15 Final  . WBC 04/15/2017 8.1  3.6 - 11.0 K/uL Final  . RBC 04/15/2017 4.34  3.80 - 5.20 MIL/uL Final  . Hemoglobin 04/15/2017 13.5  12.0 - 16.0 g/dL Final  . HCT 04/15/2017 40.9  35.0 - 47.0 % Final  . MCV 04/15/2017 94.3  80.0 - 100.0 fL Final  . MCH 04/15/2017 31.0  26.0 - 34.0 pg Final  . MCHC 04/15/2017 32.9  32.0 - 36.0 g/dL Final  . RDW 04/15/2017 19.1* 11.5 - 14.5 % Final  . Platelets 04/15/2017 469* 150 - 440 K/uL Final  . Neutrophils Relative % 04/15/2017 49  % Final  . Neutro Abs 04/15/2017 4.0  1.4 - 6.5 K/uL Final  . Lymphocytes Relative 04/15/2017 36  % Final  . Lymphs Abs 04/15/2017 2.9  1.0 - 3.6 K/uL Final  . Monocytes Relative 04/15/2017 13  % Final  . Monocytes Absolute 04/15/2017 1.0* 0.2 - 0.9 K/uL Final  . Eosinophils Relative 04/15/2017 1  % Final  . Eosinophils Absolute 04/15/2017  0.1  0 - 0.7 K/uL Final  . Basophils Relative 04/15/2017 1  % Final  . Basophils Absolute 04/15/2017 0.1  0 - 0.1 K/uL Final    Assessment:  Molly Perez is a 71 y.o. 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  She has a history of mild leukocytosis, thromobocytosis, and erythrocytosis.   Platelet count has ranged between 512,000 - 622,000 since 03/24/2016.  WBC has been mildly elevated (10,400 - 10,600).  Hematocrit was normal until 09/18/2016.  At that time, hematocrit was 50.7 with a hemoglobin of 16.6.  She is on a baby aspirin.  Work-up on 11/05/2016 revealed a hematocrit of 43.7, hemoglobin 14.3, platelets 583,00, WBC 9800 with an ANC of 6600.  Normal studies included:  Ferritin, iron studies, and erythropoietin level.  JAK2 was positive for V617F.  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  Marrow was normocellular to mildy hypercellular for age (40-50%) with trilineage hematopoiesis including a proliferation of atypical megakaryocytes with focal clusters and no increase in blasts.  There was no significant increase in marrow reticulin fibers.  Storage iron was present.  Cytogenetics were normal (24, XX).  Flow cytometry was negative.  FISH studies for MPN/CML were negative.  She is on hydroxyurea (began 12/21/2016).  She is taking 1 tablet 5 days/week and 2 tablets 2 days a week (total 9 tablets/week).  She denies any side effects.  Left lower extremity duplex on 01/26/2017 revealed no evidence of a DVT.  EGD on 07/04/2016 revealed reflux esophagitis, H pylori gastritis, and a small hiatal hernia.  H pylori was treated with metronidazole, clarithromycin, and omeprazole.  Colonoscopy on 07/04/2016 was normal.  Symptomatically, she is doing well.  Exam is stable.  Platelet count is 469,000.  Plan: 1.  Labs today:  CBC with diff, CMP. 2.  Discuss labs today.   Goal platelet count is 400,000.  3.  Discuss hydroxyurea 500 mg po q day 4 days per week and 500 mg po BID 3 days per week (10 tablets/week). 4.  Continue baby aspirin a  day. 5.  RTC in 6 weeks for labs (CBC with diff, CMP). 6.  RTC in 3 months for MD assessment and labs (CBC with diff, CMP).   Lequita Asal, MD  04/15/2017, 11:13 AM

## 2017-04-15 NOTE — Progress Notes (Signed)
Patient recently saw PCP who instructed patient to check with oncologist as to whether or not patient should continue with Aspirin 81 mg daily?

## 2017-05-01 ENCOUNTER — Ambulatory Visit
Admission: RE | Admit: 2017-05-01 | Discharge: 2017-05-01 | Disposition: A | Discharge status: Home / Self Care | Payer: Medicare HMO | Source: Ambulatory Visit | Attending: Pediatrics | Admitting: Pediatrics

## 2017-05-01 DIAGNOSIS — R921 Mammographic calcification found on diagnostic imaging of breast: Secondary | ICD-10-CM

## 2017-05-01 DIAGNOSIS — R928 Other abnormal and inconclusive findings on diagnostic imaging of breast: Secondary | ICD-10-CM | POA: Diagnosis not present

## 2017-05-12 ENCOUNTER — Other Ambulatory Visit: Payer: Self-pay | Admitting: *Deleted

## 2017-05-12 DIAGNOSIS — D473 Essential (hemorrhagic) thrombocythemia: Secondary | ICD-10-CM

## 2017-05-12 MED ORDER — HYDROXYUREA 500 MG PO CAPS: ORAL_CAPSULE | ORAL | 1 refills | Status: DC

## 2017-05-12 NOTE — Telephone Encounter (Signed)
Patient states she needs a refill of her hydrea and that she is currently taking 500 mg Su-Tu-Th-F and 100 mg M-W-Sa. This is not what is in her chart for dosing. Please advise

## 2017-05-21 ENCOUNTER — Other Ambulatory Visit: Payer: Self-pay | Admitting: Hematology and Oncology

## 2017-05-21 DIAGNOSIS — D473 Essential (hemorrhagic) thrombocythemia: Secondary | ICD-10-CM

## 2017-05-27 ENCOUNTER — Inpatient Hospital Stay: Payer: Medicare HMO | Attending: Hematology and Oncology

## 2017-05-27 ENCOUNTER — Telehealth: Payer: Self-pay | Admitting: *Deleted

## 2017-05-27 DIAGNOSIS — D473 Essential (hemorrhagic) thrombocythemia: Secondary | ICD-10-CM | POA: Insufficient documentation

## 2017-05-27 LAB — CBC WITH DIFFERENTIAL/PLATELET
Basophils Absolute: 0.1 10*3/uL (ref 0–0.1)
Basophils Relative: 1 %
Eosinophils Absolute: 0.1 10*3/uL (ref 0–0.7)
Eosinophils Relative: 1 %
HCT: 40.6 % (ref 35.0–47.0)
Hemoglobin: 13.6 g/dL (ref 12.0–16.0)
Lymphocytes Relative: 34 %
Lymphs Abs: 2.8 10*3/uL (ref 1.0–3.6)
MCH: 33.1 pg (ref 26.0–34.0)
MCHC: 33.5 g/dL (ref 32.0–36.0)
MCV: 98.8 fL (ref 80.0–100.0)
Monocytes Absolute: 0.8 10*3/uL (ref 0.2–0.9)
Monocytes Relative: 9 %
Neutro Abs: 4.7 10*3/uL (ref 1.4–6.5)
Neutrophils Relative %: 55 %
Platelets: 408 10*3/uL (ref 150–440)
RBC: 4.11 MIL/uL (ref 3.80–5.20)
RDW: 16.2 % — ABNORMAL HIGH (ref 11.5–14.5)
WBC: 8.4 10*3/uL (ref 3.6–11.0)

## 2017-05-27 NOTE — Telephone Encounter (Signed)
Attempted to call patient X 3.  No answer.  Will try again later.

## 2017-05-27 NOTE — Telephone Encounter (Signed)
-----   Message from Lequita Asal, MD sent at 05/27/2017  1:30 PM EDT ----- Regarding: Please call patient  Platelet count good on current dose of hydoxyurea.  Please document what dose she is taking.  Does she need a refill of her hydroxyurea?  M  ----- Message ----- From: Interface, Lab In [place] Sent: 05/27/2017  12:13 PM To: Lequita Asal, MD

## 2017-05-28 ENCOUNTER — Telehealth: Payer: Self-pay | Admitting: *Deleted

## 2017-05-28 NOTE — Telephone Encounter (Signed)
Called patient to inform her that levels are good.  Also inquired about current dosage of Hydrea.  Patient states she takes two tablets (total 1000 mg) on Monday, Wednesday and Saturday.  She takes one tablet (500 mg) Tues.Demetrius Charity, Fri and Sun.  She does not need a refill at that time.

## 2017-05-28 NOTE — Telephone Encounter (Signed)
-----   Message from Lequita Asal, MD sent at 05/27/2017  1:30 PM EDT ----- Regarding: Please call patient  Platelet count good on current dose of hydoxyurea.  Please document what dose she is taking.  Does she need a refill of her hydroxyurea?  M  ----- Message ----- From: Interface, Lab In [place] Sent: 05/27/2017  12:13 PM To: Lequita Asal, MD

## 2017-07-14 NOTE — Progress Notes (Signed)
[facility] day:  07/15/2017   Chief Complaint: Molly Perez is a 71 y.o. 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  HPI:  The patient was last seen in the hematology clinic on 04/15/2017.  At that time, she was complaining of generalized pain; stated "everyone has pain".  She had atraumatic pain to her left arm. Platelet count was 469,000. Hydroxyurea was increased from 9 pills/week to 10 pills/week.  CBC on 05/27/2017 revealed hemoglobin 13.6, hematocrit 40.6, and a platelet count of 408,000.   Routine diagnostic mammogram on 05/01/2017 that demonstrated stable, likely benign, calcifications in the left breast, and no new or suspicious findings in either breast.   During the interim, she has felt "pretty good".  She is not having pain today. She has intermittent constipation. Acid reflux is controlled.  She is currently taking hydroxyurea 2 tabs (1000mg ) on Monday, Wednesday, Saturday and 1 tab (500mg ) on Tuesday, Thursday, Friday, and Sunday for a total of 10 pills/week.   Past Medical History:  Diagnosis Date  . Cataract cortical, senile   . GERD (gastroesophageal reflux disease)   . Hypercholesterolemia   . Obesity   . Pre-diabetes   . Prediabetes   . Vitamin D deficiency      Past Surgical History:  Procedure Laterality Date  . ABDOMINAL HYSTERECTOMY    . COLONOSCOPY    . COLONOSCOPY WITH PROPOFOL N/A 07/04/2016   Procedure: COLONOSCOPY WITH PROPOFOL;  Surgeon: Lollie Sails, MD;  Location[facility] ENDOSCOPY;  Service: Endoscopy;  Laterality: N/A;  . ESOPHAGOGASTRODUODENOSCOPY (EGD) WITH PROPOFOL N/A 07/04/2016   Procedure: ESOPHAGOGASTRODUODENOSCOPY (EGD) WITH PROPOFOL;  Surgeon: Lollie Sails, MD;  Location: [facility] ENDOSCOPY;  Service: Endoscopy;  Laterality: N/A;    Family History  Problem Relation Age of Onset  . Alzheimer's disease Father   . Congestive Heart Failure  Mother   . Diabetes Mother   . Multiple sclerosis Brother   . Lupus Grandchild   . Stroke Grandchild   . Breast cancer Neg Hx     Social History:  reports that she has never smoked. Her smokeless tobacco use includes Snuff. She reports that she does not drink alcohol or use drugs.  She lives in [place].  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  She retired at age 60.  She worked for [employer].  She denies any exposure to radiation or toxins.  She is concerned about finances and paying for new medications.  She is on a fixed income.  The patient is alone today.  Allergies:  Allergies  Allergen Reactions  . Amoxicillin Other (See Comments)  . Lipitor [Atorvastatin] Other (See Comments)    Weakness in legs Weakness in legs  . Penicillins Other (See Comments)    Pt states medication upsets her stomach    Current Medications: Current Outpatient Prescriptions  Medication Sig Dispense Refill  . acetaminophen (TYLENOL) 500 MG tablet Take by mouth.    Marland Kitchen aspirin EC 81 MG tablet Take 81 mg by mouth daily.    . Cholecalciferol (VITAMIN D3) 5000 UNITS CAPS Take 1 capsule by mouth daily.    . hydroxyurea (HYDREA) 500 MG capsule Take 1 cap (500 mg)Sun, Tues, Thurs, and Friday and take 2 caps (1000 mg)on Mon, Wed and Sat. 97 capsule 1  . ibuprofen (ADVIL,MOTRIN) 200 MG tablet Take 2 tablets by mouth every 4 (four) hours as needed.    Marland Kitchen  mupirocin ointment (BACTROBAN) 2 % Apply 1 application topically 3 (three) times daily. 22 g 0  . ondansetron (ZOFRAN) 8 MG tablet Take 1 tablet (8 mg total) by mouth every 8 (eight) hours as needed for nausea or vomiting. 20 tablet 0  . pantoprazole (PROTONIX) 40 MG tablet Take 40 mg by mouth daily.    . pravastatin (PRAVACHOL) 80 MG tablet Take by mouth.    . vitamin B-12 (CYANOCOBALAMIN) 1000 MCG tablet Take 1 tablet (1,000 mcg total) by mouth daily. 30 tablet 0   No current facility-administered medications for this visit.      Review of Systems:  GENERAL:  Feels "pretty good".  No fevers.  Weight up 4 pounds.  PERFORMANCE STATUS (ECOG):  1 HEENT:  Cataracts.  No visual changes, runny nose, sore throat, mouth sores or tenderness. Lungs: Shortness of breath with exertion.  No cough.  No hemoptysis.  No sleep apnea. Cardiac:  No chest pain, palpitations, orthopnea, or PND. GI:  Reflux, controlled. Intermittent constipation.  No nausea, vomiting, diarrhea, melena or hematochezia. GU:  No urgency, frequency, dysuria, or hematuria. Musculoskeletal:  Right knee issues.  No bone pain.  No muscle tenderness. Extremities:  Restless legs.  Left leg discomfort without trauma.  No apparent swelling. Skin:  No rashes or skin changes. Neuro:  No headache, numbness or weakness, balance or coordination issues. Endocrine:  Prediabetic.  No thyroid issues, hot flashes or night sweats. Psych:  No mood changes, depression or anxiety. Pain:  No pain. Review of systems:  All other systems reviewed and found to be negative.  Physical Exam: Blood pressure 140/84, pulse 65, temperature (!) 97.4 F (36.3 C), temperature source Tympanic, resp. rate 18, weight 203 lb 4.2 oz (92.2 kg). GENERAL:  Well developed, well nourished, woman sitting comfortably in the exam room in no acute distress. MENTAL STATUS:  Alert and oriented to person, place and time. HEAD:  Long light brown hair in a ponytail.  Normocephalic, atraumatic, face symmetric, no Cushingoid features. EYES:  Glasses.  Brown eyes.  Pupils equal round and reactive to light and accomodation.  No conjunctivitis or scleral icterus. ENT:  Oropharynx clear without lesion.  Poor dentition.  Tongue normal.  Mucous membranes moist.  RESPIRATORY:  Clear to auscultation without rales, wheezes or rhonchi. CARDIOVASCULAR:  Regular rate and rhythm without murmur, rub or gallop. ABDOMEN:  Soft, non-tender, with active bowel sounds, and no hepatosplenomegaly.  No masses. SKIN:  Right leg  peeling.  No rashes, ulcers or lesions. EXTREMITIES: No edema, no skin discoloration or tenderness.  No palpable cords. LYMPH NODES: No palpable cervical, supraclavicular, axillary or inguinal adenopathy  NEUROLOGICAL: Unremarkable. PSYCH:  Appropriate.   Appointment on 07/15/2017  Component Date Value Ref Range Status  . WBC 07/15/2017 7.1  3.6 - 11.0 K/uL Final  . RBC 07/15/2017 3.86  3.80 - 5.20 MIL/uL Final  . Hemoglobin 07/15/2017 13.2  12.0 - 16.0 g/dL Final  . HCT 07/15/2017 38.9  35.0 - 47.0 % Final  . MCV 07/15/2017 100.7* 80.0 - 100.0 fL Final  . MCH 07/15/2017 34.1* 26.0 - 34.0 pg Final  . MCHC 07/15/2017 33.8  32.0 - 36.0 g/dL Final  . RDW 07/15/2017 15.1* 11.5 - 14.5 % Final  . Platelets 07/15/2017 387  150 - 440 K/uL Final  . Neutrophils Relative % 07/15/2017 52  % Final  . Neutro Abs 07/15/2017 3.8  1.4 - 6.5 K/uL Final  . Lymphocytes Relative 07/15/2017 34  % Final  . Lymphs  Abs 07/15/2017 2.4  1.0 - 3.6 K/uL Final  . Monocytes Relative 07/15/2017 12  % Final  . Monocytes Absolute 07/15/2017 0.8  0.2 - 0.9 K/uL Final  . Eosinophils Relative 07/15/2017 1  % Final  . Eosinophils Absolute 07/15/2017 0.1  0 - 0.7 K/uL Final  . Basophils Relative 07/15/2017 1  % Final  . Basophils Absolute 07/15/2017 0.0  0 - 0.1 K/uL Final    Assessment:  Molly Perez is a 71 y.o. 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  She has a history of mild leukocytosis, thromobocytosis, and erythrocytosis.   Platelet count has ranged between 512,000 - 622,000 since 03/24/2016.  WBC has been mildly elevated (10,400 - 10,600).  Hematocrit was normal until 09/18/2016.  At that time, hematocrit was 50.7 with a hemoglobin of 16.6.  She is on a baby aspirin.  Work-up on 11/05/2016 revealed a hematocrit of 43.7, hemoglobin 14.3, platelets 583,00, WBC 9800 with an ANC of 6600.  Normal studies included:  Ferritin, iron studies, and erythropoietin level.  JAK2 was  positive for V617F.  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  Marrow was normocellular to mildy hypercellular for age (40-50%) with trilineage hematopoiesis including a proliferation of atypical megakaryocytes with focal clusters and no increase in blasts.  There was no significant increase in marrow reticulin fibers.  Storage iron was present.  Cytogenetics were normal (45, XX).  Flow cytometry was negative.  FISH studies for MPN/CML were negative.  She is on hydroxyurea (began 12/21/2016).  She is taking 1 tablet 4 days/week and 2 tablets 3 days a week (total 10 tablets/week).  She denies any side effects.  Left lower extremity duplex on 01/26/2017 revealed no evidence of a DVT.  EGD on 07/04/2016 revealed reflux esophagitis, H pylori gastritis, and a small hiatal hernia.  H pylori was treated with metronidazole, clarithromycin, and omeprazole. Colonoscopy on 07/04/2016 was normal.  Symptomatically, she is doing well.  Exam is stable.  Hemoglobin is 13.2, hematocrit 38.9, and platelet count is 387,000.  Plan: 1.  Labs today:  CBC with diff, CMP. 2.  Discuss labs today.  Platelet count is 387,000 today.  Goal platelet count is <= 400,000.   Discuss continue current dose of hydroxyurea. 3.  Continue current dose of hydroxyurea 500 mg po q day 4 days/week and 500 mg po BID 3 days/week (10 tablets/week). 4.  Continue baby aspirin a day. 5.  RTC in 3 months for MD assessment and labs (CBC with diff, CMP).   Honor Loh, NP  07/15/2017, 10:57 AM   I saw and evaluated the patient, participating in the key portions of the service and reviewing pertinent diagnostic studies and records.  I reviewed the nurse practitioner's note and agree with the findings and the plan.  The assessment and plan were discussed with the patient.  A few questions were asked by the patient and answered.   Lequita Asal,  MD 07/15/2017,3:21 PM

## 2017-07-15 ENCOUNTER — Other Ambulatory Visit: Payer: Self-pay | Admitting: *Deleted

## 2017-07-15 ENCOUNTER — Inpatient Hospital Stay: Payer: Medicare HMO | Attending: Hematology and Oncology | Admitting: Hematology and Oncology

## 2017-07-15 ENCOUNTER — Inpatient Hospital Stay: Payer: Medicare HMO

## 2017-07-15 ENCOUNTER — Encounter: Payer: Self-pay | Admitting: Hematology and Oncology

## 2017-07-15 VITALS — BP 140/84 | HR 65 | Temp 97.4°F | Resp 18 | Wt 203.3 lb

## 2017-07-15 DIAGNOSIS — E559 Vitamin D deficiency, unspecified: Secondary | ICD-10-CM | POA: Insufficient documentation

## 2017-07-15 DIAGNOSIS — E7800 Pure hypercholesterolemia, unspecified: Secondary | ICD-10-CM | POA: Diagnosis not present

## 2017-07-15 DIAGNOSIS — Z79899 Other long term (current) drug therapy: Secondary | ICD-10-CM

## 2017-07-15 DIAGNOSIS — Z8719 Personal history of other diseases of the digestive system: Secondary | ICD-10-CM | POA: Diagnosis not present

## 2017-07-15 DIAGNOSIS — D473 Essential (hemorrhagic) thrombocythemia: Secondary | ICD-10-CM

## 2017-07-15 DIAGNOSIS — E669 Obesity, unspecified: Secondary | ICD-10-CM | POA: Diagnosis not present

## 2017-07-15 DIAGNOSIS — Z7982 Long term (current) use of aspirin: Secondary | ICD-10-CM | POA: Insufficient documentation

## 2017-07-15 DIAGNOSIS — F17290 Nicotine dependence, other tobacco product, uncomplicated: Secondary | ICD-10-CM | POA: Insufficient documentation

## 2017-07-15 DIAGNOSIS — K219 Gastro-esophageal reflux disease without esophagitis: Secondary | ICD-10-CM

## 2017-07-15 DIAGNOSIS — R69 Illness, unspecified: Secondary | ICD-10-CM | POA: Diagnosis not present

## 2017-07-15 DIAGNOSIS — K5900 Constipation, unspecified: Secondary | ICD-10-CM | POA: Diagnosis not present

## 2017-07-15 LAB — COMPREHENSIVE METABOLIC PANEL
ALT: 14 U/L (ref 14–54)
AST: 18 U/L (ref 15–41)
Albumin: 3.3 g/dL — ABNORMAL LOW (ref 3.5–5.0)
Alkaline Phosphatase: 39 U/L (ref 38–126)
Anion gap: 5 (ref 5–15)
BUN: <5 mg/dL — ABNORMAL LOW (ref 6–20)
CO2: 27 mmol/L (ref 22–32)
Calcium: 8.7 mg/dL — ABNORMAL LOW (ref 8.9–10.3)
Chloride: 104 mmol/L (ref 101–111)
Creatinine, Ser: 0.94 mg/dL (ref 0.44–1.00)
GFR calc Af Amer: >60 mL/min (ref >60)
GFR calc non Af Amer: 59 mL/min — ABNORMAL LOW (ref >60)
Glucose, Bld: 98 mg/dL (ref 65–99)
Potassium: 3.6 mmol/L (ref 3.5–5.1)
Sodium: 136 mmol/L (ref 135–145)
Total Bilirubin: 0.7 mg/dL (ref 0.3–1.2)
Total Protein: 7.5 g/dL (ref 6.5–8.1)

## 2017-07-15 LAB — CBC WITH DIFFERENTIAL/PLATELET
Basophils Absolute: 0 10*3/uL (ref 0–0.1)
Basophils Relative: 1 %
Eosinophils Absolute: 0.1 10*3/uL (ref 0–0.7)
Eosinophils Relative: 1 %
HCT: 38.9 % (ref 35.0–47.0)
Hemoglobin: 13.2 g/dL (ref 12.0–16.0)
Lymphocytes Relative: 34 %
Lymphs Abs: 2.4 10*3/uL (ref 1.0–3.6)
MCH: 34.1 pg — ABNORMAL HIGH (ref 26.0–34.0)
MCHC: 33.8 g/dL (ref 32.0–36.0)
MCV: 100.7 fL — ABNORMAL HIGH (ref 80.0–100.0)
Monocytes Absolute: 0.8 10*3/uL (ref 0.2–0.9)
Monocytes Relative: 12 %
Neutro Abs: 3.8 10*3/uL (ref 1.4–6.5)
Neutrophils Relative %: 52 %
Platelets: 387 10*3/uL (ref 150–440)
RBC: 3.86 MIL/uL (ref 3.80–5.20)
RDW: 15.1 % — ABNORMAL HIGH (ref 11.5–14.5)
WBC: 7.1 10*3/uL (ref 3.6–11.0)

## 2017-07-15 NOTE — Progress Notes (Signed)
Patient states her right leg looks like it is peeling.  She wants MD to assess.  Otherwise, offers no complaints.

## 2017-08-12 DIAGNOSIS — R69 Illness, unspecified: Secondary | ICD-10-CM | POA: Diagnosis not present

## 2017-09-28 ENCOUNTER — Other Ambulatory Visit: Payer: Self-pay | Admitting: Hematology and Oncology

## 2017-09-28 DIAGNOSIS — D473 Essential (hemorrhagic) thrombocythemia: Secondary | ICD-10-CM

## 2017-10-13 ENCOUNTER — Other Ambulatory Visit: Payer: Self-pay | Admitting: *Deleted

## 2017-10-13 DIAGNOSIS — D473 Essential (hemorrhagic) thrombocythemia: Secondary | ICD-10-CM

## 2017-10-13 MED ORDER — HYDROXYUREA 500 MG PO CAPS: ORAL_CAPSULE | ORAL | 1 refills | Status: DC

## 2017-10-13 NOTE — Telephone Encounter (Signed)
  Does this need a refill?  I don't see the instructions with the Rx.  M

## 2017-10-14 ENCOUNTER — Inpatient Hospital Stay: Payer: Medicare HMO | Attending: Hematology and Oncology

## 2017-10-14 ENCOUNTER — Inpatient Hospital Stay (HOSPITAL_BASED_OUTPATIENT_CLINIC_OR_DEPARTMENT_OTHER): Payer: Medicare HMO | Admitting: Hematology and Oncology

## 2017-10-14 VITALS — BP 142/98 | HR 69 | Temp 97.5°F | Resp 20 | Wt 200.4 lb

## 2017-10-14 DIAGNOSIS — E559 Vitamin D deficiency, unspecified: Secondary | ICD-10-CM | POA: Diagnosis not present

## 2017-10-14 DIAGNOSIS — E7800 Pure hypercholesterolemia, unspecified: Secondary | ICD-10-CM

## 2017-10-14 DIAGNOSIS — K219 Gastro-esophageal reflux disease without esophagitis: Secondary | ICD-10-CM

## 2017-10-14 DIAGNOSIS — E669 Obesity, unspecified: Secondary | ICD-10-CM

## 2017-10-14 DIAGNOSIS — F17290 Nicotine dependence, other tobacco product, uncomplicated: Secondary | ICD-10-CM | POA: Diagnosis not present

## 2017-10-14 DIAGNOSIS — Z7982 Long term (current) use of aspirin: Secondary | ICD-10-CM

## 2017-10-14 DIAGNOSIS — D473 Essential (hemorrhagic) thrombocythemia: Secondary | ICD-10-CM

## 2017-10-14 DIAGNOSIS — Z79899 Other long term (current) drug therapy: Secondary | ICD-10-CM

## 2017-10-14 DIAGNOSIS — R69 Illness, unspecified: Secondary | ICD-10-CM | POA: Diagnosis not present

## 2017-10-14 LAB — COMPREHENSIVE METABOLIC PANEL
ALT: 15 U/L (ref 14–54)
AST: 17 U/L (ref 15–41)
Albumin: 3.5 g/dL (ref 3.5–5.0)
Alkaline Phosphatase: 41 U/L (ref 38–126)
Anion gap: 7 (ref 5–15)
BUN: 11 mg/dL (ref 6–20)
CO2: 26 mmol/L (ref 22–32)
Calcium: 9 mg/dL (ref 8.9–10.3)
Chloride: 105 mmol/L (ref 101–111)
Creatinine, Ser: 0.92 mg/dL (ref 0.44–1.00)
GFR calc Af Amer: >60 mL/min (ref >60)
GFR calc non Af Amer: >60 mL/min (ref >60)
Glucose, Bld: 94 mg/dL (ref 65–99)
Potassium: 4.3 mmol/L (ref 3.5–5.1)
Sodium: 138 mmol/L (ref 135–145)
Total Bilirubin: 0.6 mg/dL (ref 0.3–1.2)
Total Protein: 8.4 g/dL — ABNORMAL HIGH (ref 6.5–8.1)

## 2017-10-14 LAB — CBC WITH DIFFERENTIAL/PLATELET
Basophils Absolute: 0.1 10*3/uL (ref 0–0.1)
Basophils Relative: 1 %
Eosinophils Absolute: 0.1 10*3/uL (ref 0–0.7)
Eosinophils Relative: 1 %
HCT: 41.4 % (ref 35.0–47.0)
Hemoglobin: 13.9 g/dL (ref 12.0–16.0)
Lymphocytes Relative: 32 %
Lymphs Abs: 2.4 10*3/uL (ref 1.0–3.6)
MCH: 34.3 pg — ABNORMAL HIGH (ref 26.0–34.0)
MCHC: 33.5 g/dL (ref 32.0–36.0)
MCV: 102.3 fL — ABNORMAL HIGH (ref 80.0–100.0)
Monocytes Absolute: 0.5 10*3/uL (ref 0.2–0.9)
Monocytes Relative: 7 %
Neutro Abs: 4.5 10*3/uL (ref 1.4–6.5)
Neutrophils Relative %: 59 %
Platelets: 427 10*3/uL (ref 150–440)
RBC: 4.05 MIL/uL (ref 3.80–5.20)
RDW: 13.9 % (ref 11.5–14.5)
WBC: 7.5 10*3/uL (ref 3.6–11.0)

## 2017-10-14 NOTE — Patient Instructions (Signed)
  Your platelets are high at 427,000. You will need to increase your Hydroxyurea based as follows:  Hydroxyurea 500 mg BID on Monday, Wednesday, Friday, and Saturday. Hydroxyurea 500 mg once daily on Tuesday, Thursday. and Sunday.  This will a total of a ONE PILL per week increase (11 tablets/week) in your dose.

## 2017-10-14 NOTE — Progress Notes (Signed)
Patient offers no complaints today. 

## 2017-10-14 NOTE — Progress Notes (Signed)
[facility] day:  10/14/2017   Chief Complaint: Molly Perez is a 71 y.o. 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  HPI:  The patient was last seen in the hematology clinic on 07/15/2017.  At that time, she was doing well.  Exam was stable.  Hemoglobin was 13.2, hematocrit 38.9, and platelet count was 387,000.  She continued hydroxyurea 500 mg po q day 4 days/week and 500 mg po BID 3 days/week (10 tablets/week).  She continued a baby aspirin a day.  During the interim, patient is doing well. She denies physical complaints. Patient is on Hydrea 500 mg BID x 3 days a week (MWS), and 559m daily x 4 days a week (T,Th,F,Su) for a total of 10 pills a week. Patient denies and bruising or bleeding; no hematochezia, melena, or vaginal bleeding. She has not had any recent fevers or sweats. Patient feels well, and denies recent illness.    Past Medical History:  Diagnosis Date  . Cataract cortical, senile   . GERD (gastroesophageal reflux disease)   . Hypercholesterolemia   . Obesity   . Pre-diabetes   . Prediabetes   . Vitamin D deficiency      Past Surgical History:  Procedure Laterality Date  . ABDOMINAL HYSTERECTOMY    . COLONOSCOPY    . COLONOSCOPY WITH PROPOFOL N/A 07/04/2016   Procedure: COLONOSCOPY WITH PROPOFOL;  Surgeon: MLollie Sails MD;  [facility];  Service: Endoscopy;  Laterality: N/A;  . ESOPHAGOGASTRODUODENOSCOPY (EGD) WITH PROPOFOL N/A 07/04/2016   Procedure: ESOPHAGOGASTRODUODENOSCOPY (EGD) WITH PROPOFOL;  Surgeon: MLollie Sails MD;  [facility];  Service: Endoscopy;  Laterality: N/A;    Family History  Problem Relation Age of Onset  . Alzheimer's disease Father   . Congestive Heart Failure Mother   . Diabetes Mother   . Multiple sclerosis Brother   . Lupus Grandchild   . Stroke Grandchild   . Breast cancer Neg Hx     Social History:   reports that  has never smoked. Her smokeless tobacco use includes snuff. She reports that she does not drink alcohol or use drugs.  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  She is concerned about finances and paying for new medications.  She is on a fixed income.  The patient is alone today.  Allergies:  Allergies  Allergen Reactions  . Amoxicillin Other (See Comments)  . Lipitor [Atorvastatin] Other (See Comments)    Weakness in legs Weakness in legs  . Penicillins Other (See Comments)    Pt states medication upsets her stomach    Current Medications: Current Outpatient Medications  Medication Sig Dispense Refill  . acetaminophen (TYLENOL) 500 MG tablet Take by mouth.    .Marland Kitchenaspirin EC 81 MG tablet Take 81 mg by mouth daily.    . Cholecalciferol (VITAMIN D3) 5000 UNITS CAPS Take 1 capsule by mouth daily.    . hydroxyurea (HYDREA) 500 MG capsule May take with food to minimize GI side effects.TAKE 1 CAP (500 MG)SUN, TUES, THURS, AND FRIDAY AND TAKE 2 CAPS (1000 MG)ON MON, WED AND SAT. 97 capsule 1  . ibuprofen (ADVIL,MOTRIN) 200 MG tablet Take 2 tablets by mouth every 4 (four) hours as needed.    .Marland Kitchen  mupirocin ointment (BACTROBAN) 2 % Apply 1 application topically 3 (three) times daily. 22 g 0  . ondansetron (ZOFRAN) 8 MG tablet Take 1 tablet (8 mg total) by mouth every 8 (eight) hours as needed for nausea or vomiting. 20 tablet 0  . pantoprazole (PROTONIX) 40 MG tablet Take 40 mg by mouth daily.    . pravastatin (PRAVACHOL) 80 MG tablet Take by mouth.    . vitamin B-12 (CYANOCOBALAMIN) 1000 MCG tablet Take 1 tablet (1,000 mcg total) by mouth daily. 30 tablet 0   No current facility-administered medications for this visit.     Review of Systems:  GENERAL:  Feels "pretty good".  No fevers.  Weight down 3 pounds.  PERFORMANCE  STATUS (ECOG):  1 HEENT:  Cataracts.  No visual changes, runny nose, sore throat, mouth sores or tenderness. Lungs: Shortness of breath with exertion.  No cough.  No hemoptysis.  No sleep apnea. Cardiac:  No chest pain, palpitations, orthopnea, or PND. GI:  Reflux, controlled. Intermittent constipation.  No nausea, vomiting, diarrhea, melena or hematochezia. GU:  No urgency, frequency, dysuria, or hematuria. Musculoskeletal:  Right knee issues.  No bone pain.  No muscle tenderness. Extremities:  Restless legs.  Left leg discomfort without trauma.  No apparent swelling. Skin:  No rashes or skin changes. Neuro:  No headache, numbness or weakness, balance or coordination issues. Endocrine:  Prediabetic.  No thyroid issues, hot flashes or night sweats. Psych:  No mood changes, depression or anxiety. Pain:  No pain. Review of systems:  All other systems reviewed and found to be negative.  Physical Exam: Blood pressure (!) 142/98, pulse 69, temperature (!) 97.5 F (36.4 C), temperature source Tympanic, resp. rate 20, weight 200 lb 6.4 oz (90.9 kg). GENERAL:  Well developed, well nourished, woman sitting comfortably in the exam room in no acute distress. MENTAL STATUS:  Alert and oriented to person, place and time. HEAD:  Long light brown hair in a ponytail.  Normocephalic, atraumatic, face symmetric, no Cushingoid features. EYES:  Glasses.  Brown eyes.  Pupils equal round and reactive to light and accomodation.  No conjunctivitis or scleral icterus. ENT:  Oropharynx clear without lesion.  Poor dentition.  Tongue normal.  Mucous membranes moist.  RESPIRATORY:  Clear to auscultation without rales, wheezes or rhonchi. CARDIOVASCULAR:  Regular rate and rhythm without murmur, rub or gallop. ABDOMEN:  Soft, non-tender, with active bowel sounds, and no hepatosplenomegaly.  No masses. SKIN:  Right leg peeling.  No rashes, ulcers or lesions. EXTREMITIES: No edema, no skin discoloration or tenderness.  No  palpable cords. LYMPH NODES: No palpable cervical, supraclavicular, axillary or inguinal adenopathy  NEUROLOGICAL: Unremarkable. PSYCH:  Appropriate.   Appointment on 10/14/2017  Component Date Value Ref Range Status  . Sodium 10/14/2017 138  135 - 145 mmol/L Final  . Potassium 10/14/2017 4.3  3.5 - 5.1 mmol/L Final  . Chloride 10/14/2017 105  101 - 111 mmol/L Final  . CO2 10/14/2017 26  22 - 32 mmol/L Final  . Glucose, Bld 10/14/2017 94  65 - 99 mg/dL Final  . BUN 10/14/2017 11  6 - 20 mg/dL Final  . Creatinine, Ser 10/14/2017 0.92  0.44 - 1.00 mg/dL Final  . Calcium 10/14/2017 9.0  8.9 - 10.3 mg/dL Final  . Total Protein 10/14/2017 8.4* 6.5 - 8.1 g/dL Final  . Albumin 10/14/2017 3.5  3.5 - 5.0 g/dL Final  . AST 10/14/2017 17  15 - 41 U/L Final  . ALT 10/14/2017 15  14 - 54 U/L Final  . Alkaline Phosphatase 10/14/2017 41  38 - 126 U/L Final  . Total Bilirubin 10/14/2017 0.6  0.3 - 1.2 mg/dL Final  . GFR calc non Af Amer 10/14/2017 >60  >60 mL/min Final  . GFR calc Af Amer 10/14/2017 >60  >60 mL/min Final   Comment: (NOTE) The eGFR has been calculated using the CKD EPI equation. This calculation has not been validated in all clinical situations. eGFR's persistently <60 mL/min signify possible Chronic Kidney Disease.   . Anion gap 10/14/2017 7  5 - 15 Final  . WBC 10/14/2017 7.5  3.6 - 11.0 K/uL Final  . RBC 10/14/2017 4.05  3.80 - 5.20 MIL/uL Final  . Hemoglobin 10/14/2017 13.9  12.0 - 16.0 g/dL Final  . HCT 10/14/2017 41.4  35.0 - 47.0 % Final  . MCV 10/14/2017 102.3* 80.0 - 100.0 fL Final  . MCH 10/14/2017 34.3* 26.0 - 34.0 pg Final  . MCHC 10/14/2017 33.5  32.0 - 36.0 g/dL Final  . RDW 10/14/2017 13.9  11.5 - 14.5 % Final  . Platelets 10/14/2017 427  150 - 440 K/uL Final  . Neutrophils Relative % 10/14/2017 59  % Final  . Neutro Abs 10/14/2017 4.5  1.4 - 6.5 K/uL Final  . Lymphocytes Relative 10/14/2017 32  % Final  . Lymphs Abs 10/14/2017 2.4  1.0 - 3.6 K/uL Final  .  Monocytes Relative 10/14/2017 7  % Final  . Monocytes Absolute 10/14/2017 0.5  0.2 - 0.9 K/uL Final  . Eosinophils Relative 10/14/2017 1  % Final  . Eosinophils Absolute 10/14/2017 0.1  0 - 0.7 K/uL Final  . Basophils Relative 10/14/2017 1  % Final  . Basophils Absolute 10/14/2017 0.1  0 - 0.1 K/uL Final    Assessment:  Molly Perez is a 71 y.o. 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  She has a history of mild leukocytosis, thromobocytosis, and erythrocytosis.   Platelet count has ranged between 512,000 - 622,000 since 03/24/2016.  WBC has been mildly elevated (10,400 - 10,600).  Hematocrit was normal until 09/18/2016.  At that time, hematocrit was 50.7 with a hemoglobin of 16.6.  She is on a baby aspirin.  Work-up on 11/05/2016 revealed a hematocrit of 43.7, hemoglobin 14.3, platelets 583,00, WBC 9800 with an ANC of 6600.  Normal studies included:  Ferritin, iron studies, and erythropoietin level.  JAK2 was positive for V617F.  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  Marrow was normocellular to mildy hypercellular for age (40-50%) with trilineage hematopoiesis including a proliferation of atypical megakaryocytes with focal clusters and no increase in blasts.  There was no significant increase in marrow reticulin fibers.  Storage iron was present.  Cytogenetics were normal (53, XX).  Flow cytometry was negative.  FISH studies for MPN/CML were negative.  She is on hydroxyurea (began 12/21/2016).  She is taking 1 tablet 4 days/week and 2 tablets 3 days a week (total 10 tablets/week).  She denies any side effects.  Left lower extremity duplex on 01/26/2017 revealed no evidence of a DVT.  EGD on 07/04/2016 revealed reflux esophagitis, H pylori gastritis, and a small hiatal hernia.  H pylori was treated with metronidazole, clarithromycin, and omeprazole. Colonoscopy  on 07/04/2016 was normal.  Symptomatically, she is doing well.  Exam is stable.  Hemoglobin is 13.9, hematocrit 41.4, and platelet count is 427,000.  Plan: 1.  Labs today:  CBC with diff, CMP. 2.  Discuss labs today.  Platelet count is 427,000 today.  Goal platelet count is <= 400,000.    3.  Increase current dose of Hydroxyurea 500 mg BID x 4 days/week (M,W,F,S) and 500 mg once daily x 3 days/week (T,Th,Su) for a total increase of a ONE PILL per week increase (total weekly dose is 11 tablets). 4.  Continue baby aspirin a day. 5.  RTC in 1 month for labs (CBC with diff) 6.  RTC in 3 months for MD assessment and labs (CBC with diff, CMP).   Honor Loh, NP  10/14/2017, 12:33 PM   I saw and evaluated the patient, participating in the key portions of the service and reviewing pertinent diagnostic studies and records.  I reviewed the nurse practitioner's note and agree with the findings and the plan.  The assessment and plan were discussed with the patient.  A few questions were asked by the patient and answered.   Lequita Asal, MD  10/14/2017,12:33 PM

## 2017-10-18 ENCOUNTER — Encounter: Payer: Self-pay | Admitting: Hematology and Oncology

## 2017-10-19 ENCOUNTER — Telehealth: Payer: Self-pay | Admitting: *Deleted

## 2017-10-19 NOTE — Telephone Encounter (Signed)
-----   Message from Lequita Asal, MD sent at 10/18/2017  5:12 PM EST ----- Regarding: Please call patient  Make sure she is taking her hydroxyurea correctly.  M

## 2017-10-19 NOTE — Telephone Encounter (Signed)
Called patient and LVM to inquire how patient is taking her Hydrea.  Asked her to call me back with how she is taking the medication.

## 2017-10-20 ENCOUNTER — Telehealth: Payer: Self-pay | Admitting: *Deleted

## 2017-10-20 NOTE — Telephone Encounter (Signed)
Called patient to clarify how patient is taking Hydrea.  She is taking 500 mg BID Monday, Wednesday, Friday and Saturday and 500 mg once daily on Tuesday, Thursday and Sunday.

## 2017-10-20 NOTE — Telephone Encounter (Signed)
-----   Message from Lequita Asal, MD sent at 10/18/2017  5:12 PM EST ----- Regarding: Please call patient  Make sure she is taking her hydroxyurea correctly.  M

## 2017-11-11 ENCOUNTER — Inpatient Hospital Stay: Payer: Medicare HMO | Attending: Hematology and Oncology

## 2017-11-11 DIAGNOSIS — D473 Essential (hemorrhagic) thrombocythemia: Secondary | ICD-10-CM | POA: Insufficient documentation

## 2017-11-11 LAB — CBC WITH DIFFERENTIAL/PLATELET
Basophils Absolute: 0.1 10*3/uL (ref 0–0.1)
Basophils Relative: 1 %
Eosinophils Absolute: 0.1 10*3/uL (ref 0–0.7)
Eosinophils Relative: 1 %
HCT: 38.8 % (ref 35.0–47.0)
Hemoglobin: 13.3 g/dL (ref 12.0–16.0)
Lymphocytes Relative: 33 %
Lymphs Abs: 2.8 10*3/uL (ref 1.0–3.6)
MCH: 35.1 pg — ABNORMAL HIGH (ref 26.0–34.0)
MCHC: 34.3 g/dL (ref 32.0–36.0)
MCV: 102.4 fL — ABNORMAL HIGH (ref 80.0–100.0)
Monocytes Absolute: 0.7 10*3/uL (ref 0.2–0.9)
Monocytes Relative: 9 %
Neutro Abs: 4.9 10*3/uL (ref 1.4–6.5)
Neutrophils Relative %: 58 %
Platelets: 369 10*3/uL (ref 150–440)
RBC: 3.79 MIL/uL — ABNORMAL LOW (ref 3.80–5.20)
RDW: 14.7 % — ABNORMAL HIGH (ref 11.5–14.5)
WBC: 8.5 10*3/uL (ref 3.6–11.0)

## 2017-12-24 ENCOUNTER — Other Ambulatory Visit: Payer: Self-pay | Admitting: Hematology and Oncology

## 2017-12-24 DIAGNOSIS — D473 Essential (hemorrhagic) thrombocythemia: Secondary | ICD-10-CM

## 2018-01-12 NOTE — Progress Notes (Addendum)
[facility] day:  01/13/2018   Chief Complaint: Molly Perez is a 72 y.o. 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  HPI:  The patient was last seen in the hematology clinic on 10/14/2017.  At that time, patient was doing well.  She denied any physical complaint.  Hydrea was increased to 500 mg twice daily times 4 days a week (M, W, F, S), and 500 mg daily times 3 days a week (T, Th, Su) for a total of 11 pills a week.  Exam was stable.  Hemoglobin 13.9, hematocrit 41.4, and platelets 427,000.  Labs were rechecked on 11/11/2017.  Hemoglobin 13.3, hematocrit 38.8, and platelets 369,000.  In the interim, patient is doing "pretty good". She complains of "cold symptoms" that has been persistent for the last 2 weeks. Patient had a fever initially (unable to report a Tmax). Patient has not had to use over the counter interventions to control her symptoms. Patient has mild non-productive cough at night. Patient eating and drinking well. Patient's weight has increased 4 pounds.   Patient continues to have aid reflux, which is well controlled with her currently prescribed PPI therapy. Patient has intermittent issues with constipation. Patient denies bleeding; no hematochezia, melena, or gross hematuria. She has experienced no sweats or weight loss. Patient continues on her Hydrea as prescribed.    Patient denies pain in the clinic today.    Past Medical History:  Diagnosis Date  . Cataract cortical, senile   . GERD (gastroesophageal reflux disease)   . Hypercholesterolemia   . Obesity   . Pre-diabetes   . Prediabetes   . Vitamin D deficiency      Past Surgical History:  Procedure Laterality Date  . ABDOMINAL HYSTERECTOMY    . COLONOSCOPY    . COLONOSCOPY WITH PROPOFOL N/A 07/04/2016   Procedure: COLONOSCOPY WITH PROPOFOL;  Surgeon: Lollie Sails, MD;  Location: [facility] ENDOSCOPY;  Service:  Endoscopy;  Laterality: N/A;  . ESOPHAGOGASTRODUODENOSCOPY (EGD) WITH PROPOFOL N/A 07/04/2016   Procedure: ESOPHAGOGASTRODUODENOSCOPY (EGD) WITH PROPOFOL;  Surgeon: Lollie Sails, MD;  Location: [facility] ENDOSCOPY;  Service: Endoscopy;  Laterality: N/A;    Family History  Problem Relation Age of Onset  . Alzheimer's disease Father   . Congestive Heart Failure Mother   . Diabetes Mother   . Multiple sclerosis Brother   . Lupus Grandchild   . Stroke Grandchild   . Breast cancer Neg Hx     Social History:  reports that  has never smoked. Her smokeless tobacco use includes snuff. She reports that she does not drink alcohol or use drugs.  She lives in [place].  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  She retired at age 73.  She worked for [employer].  She denies any exposure to radiation or toxins.  She is concerned about finances and paying for new medications.  She is on a fixed income.  The patient is alone today.  Allergies:  Allergies  Allergen Reactions  . Amoxicillin Other (See Comments)  . Lipitor [Atorvastatin] Other (See Comments)    Weakness in legs Weakness in legs  . Penicillins Other (See Comments)    Pt states medication upsets her stomach    Current Medications: Current Outpatient Medications  Medication Sig Dispense Refill  . acetaminophen (TYLENOL) 500 MG tablet Take by mouth.    Marland Kitchen aspirin EC 81 MG tablet Take 81  mg by mouth daily.    . Cholecalciferol (VITAMIN D3) 5000 UNITS CAPS Take 1 capsule by mouth daily.    . hydroxyurea (HYDREA) 500 MG capsule May take with food to minimize GI side effects.TAKE 1 CAP (500 MG)SUN, TUES, THURS, AND FRIDAY AND TAKE 2 CAPS (1000 MG)ON MON, WED AND SAT. 97 capsule 1  . hydroxyurea (HYDREA) 500 MG capsule TAKE 1 CAP (500 MG)SUN, TUES, THURS, AND FRIDAY AND TAKE 2 CAPS (1000 MG)ON MON, WED AND SAT. 97 capsule 1  . ibuprofen (ADVIL,MOTRIN) 200 MG tablet Take 2 tablets by mouth every 4 (four)  hours as needed.    . mupirocin ointment (BACTROBAN) 2 % Apply 1 application topically 3 (three) times daily. 22 g 0  . ondansetron (ZOFRAN) 8 MG tablet Take 1 tablet (8 mg total) by mouth every 8 (eight) hours as needed for nausea or vomiting. 20 tablet 0  . pantoprazole (PROTONIX) 40 MG tablet Take 40 mg by mouth daily.    . pravastatin (PRAVACHOL) 80 MG tablet Take by mouth.    . vitamin B-12 (CYANOCOBALAMIN) 1000 MCG tablet Take 1 tablet (1,000 mcg total) by mouth daily. 30 tablet 0   No current facility-administered medications for this visit.     Review of Systems:  GENERAL:  Feels "pretty good".  Cold symptoms.  No fevers.  Weight up 4 pounds.  PERFORMANCE STATUS (ECOG):  1 HEENT:  Cataracts.  Rhinorrhea. No visual changes, sore throat, mouth sores or tenderness. Lungs: Shortness of breath with exertion.  Non-productive cough.  No hemoptysis.  No sleep apnea. Cardiac:  No chest pain, palpitations, orthopnea, or PND. GI:  Reflux, controlled. Intermittent constipation.  No nausea, vomiting, diarrhea, melena or hematochezia. GU:  No urgency, frequency, dysuria, or hematuria. Musculoskeletal:  No bone pain.  No muscle tenderness. Extremities:  Restless legs. No apparent swelling. Skin:  No rashes or skin changes. Neuro:  No headache, numbness or weakness, balance or coordination issues. Endocrine:  Prediabetic.  No thyroid issues, hot flashes or night sweats. Psych:  No mood changes, depression or anxiety. Pain:  No focal pain. Review of systems:  All other systems reviewed and found to be negative.  Physical Exam: Blood pressure 135/81, pulse 73, temperature 97.8 F (36.6 C), temperature source Tympanic, resp. rate 18, weight 204 lb 9.4 oz (92.8 kg), SpO2 100 %. GENERAL:  Well developed, well nourished, woman sitting comfortably in the exam room in no acute distress. MENTAL STATUS:  Alert and oriented to person, place and time. HEAD:  Long light brown hair in a ponytail.   Normocephalic, atraumatic, face symmetric, no Cushingoid features. EYES:  Glasses.  Brown eyes.  Pupils equal round and reactive to light and accomodation.  No conjunctivitis or scleral icterus. ENT:  Oropharynx clear without lesion.  Poor dentition.  Tongue normal.  Mucous membranes moist.  RESPIRATORY:  Clear to auscultation without rales, wheezes or rhonchi. CARDIOVASCULAR:  Regular rate and rhythm without murmur, rub or gallop. ABDOMEN:  Soft, non-tender, with active bowel sounds, and no hepatosplenomegaly.  No masses. SKIN:  Right leg peeling.  No rashes, ulcers or lesions. EXTREMITIES: No edema, no skin discoloration or tenderness.  No palpable cords. LYMPH NODES: No palpable cervical, supraclavicular, axillary or inguinal adenopathy  NEUROLOGICAL: Unremarkable. PSYCH:  Appropriate.   Appointment on 01/13/2018  Component Date Value Ref Range Status  . Sodium 01/13/2018 136  135 - 145 mmol/L Final  . Potassium 01/13/2018 3.4* 3.5 - 5.1 mmol/L Final  . Chloride 01/13/2018 103  101 - 111 mmol/L Final  . CO2 01/13/2018 27  22 - 32 mmol/L Final  . Glucose, Bld 01/13/2018 89  65 - 99 mg/dL Final  . BUN 01/13/2018 10  6 - 20 mg/dL Final  . Creatinine, Ser 01/13/2018 0.83  0.44 - 1.00 mg/dL Final  . Calcium 01/13/2018 8.7* 8.9 - 10.3 mg/dL Final  . Total Protein 01/13/2018 7.6  6.5 - 8.1 g/dL Final  . Albumin 01/13/2018 3.2* 3.5 - 5.0 g/dL Final  . AST 01/13/2018 17  15 - 41 U/L Final  . ALT 01/13/2018 14  14 - 54 U/L Final  . Alkaline Phosphatase 01/13/2018 39  38 - 126 U/L Final  . Total Bilirubin 01/13/2018 0.4  0.3 - 1.2 mg/dL Final  . GFR calc non Af Amer 01/13/2018 >60  >60 mL/min Final  . GFR calc Af Amer 01/13/2018 >60  >60 mL/min Final   Comment: (NOTE) The eGFR has been calculated using the CKD EPI equation. This calculation has not been validated in all clinical situations. eGFR's persistently <60 mL/min signify possible Chronic Kidney Disease.   Georgiann Hahn gap 01/13/2018 6   5 - 15 Final   Performed at [facility], [address] [place], [place] [address]  . WBC 01/13/2018 9.2  3.6 - 11.0 K/uL Final  . RBC 01/13/2018 3.72* 3.80 - 5.20 MIL/uL Final  . Hemoglobin 01/13/2018 13.0  12.0 - 16.0 g/dL Final  . HCT 01/13/2018 38.5  35.0 - 47.0 % Final  . MCV 01/13/2018 103.5* 80.0 - 100.0 fL Final  . MCH 01/13/2018 35.1* 26.0 - 34.0 pg Final  . MCHC 01/13/2018 33.9  32.0 - 36.0 g/dL Final  . RDW 01/13/2018 14.5  11.5 - 14.5 % Final  . Platelets 01/13/2018 395  150 - 440 K/uL Final  . Neutrophils Relative % 01/13/2018 60  % Final  . Neutro Abs 01/13/2018 5.6  1.4 - 6.5 K/uL Final  . Lymphocytes Relative 01/13/2018 29  % Final  . Lymphs Abs 01/13/2018 2.7  1.0 - 3.6 K/uL Final  . Monocytes Relative 01/13/2018 9  % Final  . Monocytes Absolute 01/13/2018 0.8  0.2 - 0.9 K/uL Final  . Eosinophils Relative 01/13/2018 1  % Final  . Eosinophils Absolute 01/13/2018 0.1  0 - 0.7 K/uL Final  . Basophils Relative 01/13/2018 1  % Final  . Basophils Absolute 01/13/2018 0.0  0 - 0.1 K/uL Final   Performed at [facility], [address] [place], [place] [address]    Assessment:  Frannie Shedrick is a 72 y.o. 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  She has a history of mild leukocytosis, thromobocytosis, and erythrocytosis.   Platelet count has ranged between 512,000 - 622,000 since 03/24/2016.  WBC has been mildly elevated (10,400 - 10,600).  Hematocrit was normal until 09/18/2016.  At that time, hematocrit was 50.7 with a hemoglobin of 16.6.  She is on a baby aspirin.  Work-up on 11/05/2016 revealed a hematocrit of 43.7, hemoglobin 14.3, platelets 583,00, WBC 9800 with an ANC of 6600.  Normal studies included:  Ferritin, iron studies, and erythropoietin level.  JAK2 was positive for V617F.  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  Marrow was normocellular to mildy hypercellular for age (40-50%) with trilineage hematopoiesis including a proliferation of atypical megakaryocytes with focal clusters and no increase in blasts.  There was no significant increase  in marrow reticulin fibers.  Storage iron was present.  Cytogenetics were normal (27, XX).  Flow cytometry was negative.  FISH studies for MPN/CML were negative.  She is on hydroxyurea (began 12/21/2016).  She is taking 1 tablet 4 days/week and 2 tablets 3 days a week (total 11 tablets/week).  She denies any side effects.  Left lower extremity duplex on 01/26/2017 revealed no evidence of a DVT.  EGD on 07/04/2016 revealed reflux esophagitis, H pylori gastritis, and a small hiatal hernia.  H pylori was treated with metronidazole, clarithromycin, and omeprazole. Colonoscopy on 07/04/2016 was normal.  Symptomatically, she is doing well overall. She complains of cold symptoms over the last 2 weeks. Symptoms have progressively improved. She denies fevers. Exam is stable.  Hemoglobin is 13.0, hematocrit 38.5, and platelet count is 395,000.  Plan: 1.  Labs today:  CBC with diff, CMP. 2.  Discuss labs today.  Platelet count is 395,000 today.  Goal platelet count is <= 400,000. Continue Hydroxyurea 500 mg BID x 4 days/week (M,W,F,S) and 500 mg once daily x 3 days/week (T,Th,Su) for a total weekly dose of 11 tablets. 3.  Continue baby aspirin a day. 4.  RTC in 3 months for MD assessment and labs (CBC with diff, CMP).   Honor Loh, NP  01/13/2018, 1:02 PM

## 2018-01-13 ENCOUNTER — Inpatient Hospital Stay: Payer: Medicare HMO | Attending: Hematology and Oncology | Admitting: Urgent Care

## 2018-01-13 ENCOUNTER — Inpatient Hospital Stay: Payer: Medicare HMO

## 2018-01-13 VITALS — BP 135/81 | HR 73 | Temp 97.8°F | Resp 18 | Wt 204.6 lb

## 2018-01-13 DIAGNOSIS — R7303 Prediabetes: Secondary | ICD-10-CM | POA: Diagnosis not present

## 2018-01-13 DIAGNOSIS — D473 Essential (hemorrhagic) thrombocythemia: Secondary | ICD-10-CM

## 2018-01-13 DIAGNOSIS — Z720 Tobacco use: Secondary | ICD-10-CM

## 2018-01-13 DIAGNOSIS — K5900 Constipation, unspecified: Secondary | ICD-10-CM

## 2018-01-13 DIAGNOSIS — Z7982 Long term (current) use of aspirin: Secondary | ICD-10-CM

## 2018-01-13 DIAGNOSIS — K219 Gastro-esophageal reflux disease without esophagitis: Secondary | ICD-10-CM

## 2018-01-13 DIAGNOSIS — Z79899 Other long term (current) drug therapy: Secondary | ICD-10-CM | POA: Diagnosis not present

## 2018-01-13 LAB — CBC WITH DIFFERENTIAL/PLATELET
Basophils Absolute: 0 10*3/uL (ref 0–0.1)
Basophils Relative: 1 %
Eosinophils Absolute: 0.1 10*3/uL (ref 0–0.7)
Eosinophils Relative: 1 %
HCT: 38.5 % (ref 35.0–47.0)
Hemoglobin: 13 g/dL (ref 12.0–16.0)
Lymphocytes Relative: 29 %
Lymphs Abs: 2.7 10*3/uL (ref 1.0–3.6)
MCH: 35.1 pg — ABNORMAL HIGH (ref 26.0–34.0)
MCHC: 33.9 g/dL (ref 32.0–36.0)
MCV: 103.5 fL — ABNORMAL HIGH (ref 80.0–100.0)
Monocytes Absolute: 0.8 10*3/uL (ref 0.2–0.9)
Monocytes Relative: 9 %
Neutro Abs: 5.6 10*3/uL (ref 1.4–6.5)
Neutrophils Relative %: 60 %
Platelets: 395 10*3/uL (ref 150–440)
RBC: 3.72 MIL/uL — ABNORMAL LOW (ref 3.80–5.20)
RDW: 14.5 % (ref 11.5–14.5)
WBC: 9.2 10*3/uL (ref 3.6–11.0)

## 2018-01-13 LAB — COMPREHENSIVE METABOLIC PANEL
ALT: 14 U/L (ref 14–54)
AST: 17 U/L (ref 15–41)
Albumin: 3.2 g/dL — ABNORMAL LOW (ref 3.5–5.0)
Alkaline Phosphatase: 39 U/L (ref 38–126)
Anion gap: 6 (ref 5–15)
BUN: 10 mg/dL (ref 6–20)
CO2: 27 mmol/L (ref 22–32)
Calcium: 8.7 mg/dL — ABNORMAL LOW (ref 8.9–10.3)
Chloride: 103 mmol/L (ref 101–111)
Creatinine, Ser: 0.83 mg/dL (ref 0.44–1.00)
GFR calc Af Amer: >60 mL/min (ref >60)
GFR calc non Af Amer: >60 mL/min (ref >60)
Glucose, Bld: 89 mg/dL (ref 65–99)
Potassium: 3.4 mmol/L — ABNORMAL LOW (ref 3.5–5.1)
Sodium: 136 mmol/L (ref 135–145)
Total Bilirubin: 0.4 mg/dL (ref 0.3–1.2)
Total Protein: 7.6 g/dL (ref 6.5–8.1)

## 2018-01-13 NOTE — Progress Notes (Signed)
Patient offers no complaints today. 

## 2018-03-21 ENCOUNTER — Other Ambulatory Visit: Payer: Self-pay

## 2018-03-21 ENCOUNTER — Ambulatory Visit
Admission: EM | Admit: 2018-03-21 | Discharge: 2018-03-21 | Disposition: A | Discharge status: Home / Self Care | Payer: Medicare HMO | Attending: Family Medicine | Admitting: Family Medicine

## 2018-03-21 ENCOUNTER — Encounter: Payer: Self-pay | Admitting: Gynecology

## 2018-03-21 DIAGNOSIS — M25512 Pain in left shoulder: Secondary | ICD-10-CM | POA: Diagnosis not present

## 2018-03-21 MED ORDER — MELOXICAM 15 MG PO TABS: 15.0000 mg | ORAL_TABLET | Freq: Every day | ORAL | 0 refills | Status: DC | PRN

## 2018-03-21 NOTE — ED Provider Notes (Signed)
[facility]  CSN: [id number] Arrival date & time: 03/21/18  1042  History   Chief Complaint Chief Complaint  Patient presents with  . Shoulder Pain   HPI   72 year old female presents with chronic left shoulder pain.  Patient states that she has had left shoulder pain for years.  She states it has been worse as of late.  That is what brought her in for evaluation today.  Patient reports that her pain is located laterally.  Decreased range of motion.  No known exacerbating or relieving factors.  No recent fall, trauma, injury.  No other associated symptoms.  No other complaints.  Past Medical History:  Diagnosis Date  . Cataract cortical, senile   . GERD (gastroesophageal reflux disease)   . Hypercholesterolemia   . Obesity   . Pre-diabetes   . Prediabetes   . Vitamin D deficiency     Patient Active Problem List   Diagnosis Date Noted  . Essential thrombocythemia ([place]) 11/26/2016  . 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 Back pain, chronic 05/09/2015  . Hypercholesteremia 05/09/2015  . Obesity, Class II, BMI 35-39.9 05/09/2015  . Prediabetes 05/09/2015  . Avitaminosis D 05/09/2015    Past Surgical History:  Procedure Laterality Date  . ABDOMINAL HYSTERECTOMY    . COLONOSCOPY    . COLONOSCOPY WITH PROPOFOL N/A 07/04/2016   Procedure: COLONOSCOPY WITH PROPOFOL;  Surgeon: Lollie Sails, MD;  Location: [facility] ENDOSCOPY;  Service: Endoscopy;  Laterality: N/A;  . ESOPHAGOGASTRODUODENOSCOPY (EGD) WITH PROPOFOL N/A 07/04/2016   Procedure: ESOPHAGOGASTRODUODENOSCOPY (EGD) WITH PROPOFOL;  Surgeon: Lollie Sails, MD;  Location: [facility] ENDOSCOPY;  Service: Endoscopy;  Laterality: N/A;    OB History   None     Home Medications    Prior to Admission medications   Medication Sig Start Date End Date Taking? Authorizing Provider  acetaminophen (TYLENOL) 500 MG tablet Take by mouth.   Yes [provider]  aspirin EC 81 MG  tablet Take 81 mg by mouth daily.   Yes [provider]  Cholecalciferol (VITAMIN D3) 5000 UNITS CAPS Take 1 capsule by mouth daily. 03/29/15  Yes [provider]  hydroxyurea (HYDREA) 500 MG capsule May take with food to minimize GI side effects.TAKE 1 CAP (500 MG)SUN, TUES, THURS, AND FRIDAY AND TAKE 2 CAPS (1000 MG)ON MON, WED AND SAT. 10/13/17  Yes Corcoran, Drue Second, MD  hydroxyurea (HYDREA) 500 MG capsule TAKE 1 CAP (500 MG)SUN, TUES, THURS, AND FRIDAY AND TAKE 2 CAPS (1000 MG)ON MON, WED AND SAT. 12/24/17  Yes Corcoran, Melissa C, MD  mupirocin ointment (BACTROBAN) 2 % Apply 1 application topically 3 (three) times daily. 03/20/16  Yes Melynda Ripple, MD  pantoprazole (PROTONIX) 40 MG tablet Take 40 mg by mouth daily.   Yes [provider]  vitamin B-12 (CYANOCOBALAMIN) 1000 MCG tablet Take 1 tablet (1,000 mcg total) by mouth daily. 08/01/15  Yes Plonk, Gwyndolyn Saxon, MD  meloxicam (MOBIC) 15 MG tablet Take 1 tablet (15 mg total) by mouth daily as needed. 03/21/18   Coral Spikes, DO  pravastatin (PRAVACHOL) 80 MG tablet Take by mouth. 07/08/16 01/13/18  [provider]    Family History Family History  Problem Relation Age of Onset  . Alzheimer's disease Father   . Congestive Heart Failure Mother   . Diabetes Mother   . Multiple sclerosis Brother   . Lupus Grandchild   . Stroke Grandchild   . Breast cancer Neg Hx  Social History Social History   Tobacco Use  . Smoking status: Never Smoker  . Smokeless tobacco: Current User    Types: Snuff  Substance Use Topics  . Alcohol use: No    Alcohol/week: 0.0 oz  . Drug use: No     Allergies   Amoxicillin; Lipitor [atorvastatin]; and Penicillins   Review of Systems Review of Systems  Constitutional: Negative.   Musculoskeletal:       Left shoulder pain.    Physical Exam Triage Vital Signs ED Triage Vitals  Enc Vitals Group     BP 03/21/18 1100 (!) 146/86     Pulse Rate 03/21/18 1100 76      Resp 03/21/18 1100 16     Temp 03/21/18 1100 97.9 F (36.6 C)     Temp Source 03/21/18 1100 Oral     SpO2 03/21/18 1100 97 %     Weight 03/21/18 1058 200 lb (90.7 kg)     Height 03/21/18 1058 5\' 2"  (1.575 m)     Head Circumference --      Peak Flow --      Pain Score 03/21/18 1059 8     Pain Loc --      Pain Edu? --      Excl. in [place]? --    Updated Vital Signs BP (!) 146/86 (BP Location: Left Arm)   Pulse 76   Temp 97.9 F (36.6 C) (Oral)   Resp 16   Ht 5\' 2"  (1.575 m)   Wt 200 lb (90.7 kg)   SpO2 97%   BMI 36.58 kg/m     Physical Exam  Constitutional: She is oriented to person, place, and time. She appears well-developed. No distress.  HENT:  Head: Normocephalic and atraumatic.  Pulmonary/Chest: Effort normal. No respiratory distress.  Musculoskeletal:  Left shoulder  Inspection reveals no abnormalities, atrophy or asymmetry. Palpation is normal with no tenderness over AC joint or bicipital groove. Rotator cuff strength - 4/5 supraspinatus, infraspinatus, teres minor. Positive empty can.  Positive Hawkins.   Neurological: She is alert and oriented to person, place, and time.  Psychiatric: She has a normal mood and affect. Her behavior is normal.  Nursing note and vitals reviewed.  UC Treatments / Results  Labs (all labs ordered are listed, but only abnormal results are displayed) Labs Reviewed - No data to display  EKG None  Radiology No results found.  Procedures Procedures (including critical care time)  Medications Ordered in UC Medications - No data to display  Initial Impression / Assessment and Plan / UC Course  I have reviewed the triage vital signs and the nursing notes.  Pertinent labs & imaging results that were available during my care of the patient were reviewed by me and considered in my medical decision making (see chart for details).    72 year old female presents with left shoulder pain.  Suspect rotator cuff with allergy.  Treating  with meloxicam.  Advised to see orthopedics.  Final Clinical Impressions(s) / UC Diagnoses   Final diagnoses:  Acute pain of left shoulder     Discharge Instructions     Medicine as prescribed.  Call orthopedics here in [place].  Take care  Dr. Lacinda Axon    ED Prescriptions    Medication Sig Dispense Auth. Provider   meloxicam (MOBIC) 15 MG tablet Take 1 tablet (15 mg total) by mouth daily as needed. 30 tablet Coral Spikes, DO     Controlled Substance Prescriptions [place] Controlled Substance Registry  consulted? Not Applicable   Coral Spikes, DO 03/21/18 1232

## 2018-03-21 NOTE — ED Triage Notes (Signed)
Patient c/o left shoulder/ arm pain x many years. Patient deny any injury to her left arm/shoulder.

## 2018-03-21 NOTE — Discharge Instructions (Signed)
Medicine as prescribed.  Call orthopedics here in [place].  Take care  Dr. Lacinda Axon

## 2018-03-23 ENCOUNTER — Other Ambulatory Visit: Payer: Self-pay | Admitting: *Deleted

## 2018-03-23 DIAGNOSIS — D473 Essential (hemorrhagic) thrombocythemia: Secondary | ICD-10-CM

## 2018-03-23 NOTE — Telephone Encounter (Signed)
Patient called asking if she can take Meloxicam. She also needs refill of Hydrea before she comes back since the dose was changed to 2 caps M-W-F-Sa and 1 cap T-T- Su

## 2018-03-24 MED ORDER — HYDROXYUREA 500 MG PO CAPS: ORAL_CAPSULE | ORAL | 0 refills | Status: DC

## 2018-03-24 NOTE — Telephone Encounter (Signed)
  Did this medication get refilled?  M

## 2018-04-09 ENCOUNTER — Other Ambulatory Visit: Payer: Self-pay | Admitting: Hematology and Oncology

## 2018-04-09 DIAGNOSIS — D473 Essential (hemorrhagic) thrombocythemia: Secondary | ICD-10-CM

## 2018-04-09 NOTE — Telephone Encounter (Signed)
  Does she need more?  M

## 2018-04-14 ENCOUNTER — Encounter: Payer: Self-pay | Admitting: Hematology and Oncology

## 2018-04-14 ENCOUNTER — Inpatient Hospital Stay: Payer: Medicare HMO | Attending: Hematology and Oncology | Admitting: Hematology and Oncology

## 2018-04-14 ENCOUNTER — Inpatient Hospital Stay: Payer: Medicare HMO

## 2018-04-14 VITALS — BP 137/85 | HR 73 | Temp 97.3°F | Resp 20 | Ht 62.0 in | Wt 202.8 lb

## 2018-04-14 DIAGNOSIS — G2581 Restless legs syndrome: Secondary | ICD-10-CM | POA: Diagnosis not present

## 2018-04-14 DIAGNOSIS — Z79899 Other long term (current) drug therapy: Secondary | ICD-10-CM | POA: Diagnosis not present

## 2018-04-14 DIAGNOSIS — R7303 Prediabetes: Secondary | ICD-10-CM

## 2018-04-14 DIAGNOSIS — K219 Gastro-esophageal reflux disease without esophagitis: Secondary | ICD-10-CM | POA: Diagnosis not present

## 2018-04-14 DIAGNOSIS — D473 Essential (hemorrhagic) thrombocythemia: Secondary | ICD-10-CM | POA: Diagnosis not present

## 2018-04-14 DIAGNOSIS — Z7982 Long term (current) use of aspirin: Secondary | ICD-10-CM | POA: Insufficient documentation

## 2018-04-14 DIAGNOSIS — K5900 Constipation, unspecified: Secondary | ICD-10-CM | POA: Insufficient documentation

## 2018-04-14 LAB — CBC WITH DIFFERENTIAL/PLATELET
BASOS ABS: 0.1 10*3/uL (ref 0–0.1)
Basophils Relative: 1 %
EOS ABS: 0 10*3/uL (ref 0–0.7)
EOS PCT: 1 %
HCT: 39.2 % (ref 35.0–47.0)
Hemoglobin: 13.3 g/dL (ref 12.0–16.0)
Lymphocytes Relative: 35 %
Lymphs Abs: 2.7 10*3/uL (ref 1.0–3.6)
MCH: 35.1 pg — ABNORMAL HIGH (ref 26.0–34.0)
MCHC: 33.9 g/dL (ref 32.0–36.0)
MCV: 103.5 fL — AB (ref 80.0–100.0)
MONO ABS: 0.6 10*3/uL (ref 0.2–0.9)
Monocytes Relative: 8 %
Neutro Abs: 4.3 10*3/uL (ref 1.4–6.5)
Neutrophils Relative %: 55 %
Platelets: 355 10*3/uL (ref 150–440)
RBC: 3.79 MIL/uL — AB (ref 3.80–5.20)
RDW: 14.2 % (ref 11.5–14.5)
WBC: 7.8 10*3/uL (ref 3.6–11.0)

## 2018-04-14 LAB — COMPREHENSIVE METABOLIC PANEL
ALBUMIN: 3.5 g/dL (ref 3.5–5.0)
ALT: 14 U/L (ref 14–54)
AST: 21 U/L (ref 15–41)
Alkaline Phosphatase: 42 U/L (ref 38–126)
Anion gap: 9 (ref 5–15)
BILIRUBIN TOTAL: 0.5 mg/dL (ref 0.3–1.2)
BUN: 12 mg/dL (ref 6–20)
CHLORIDE: 104 mmol/L (ref 101–111)
CO2: 24 mmol/L (ref 22–32)
Calcium: 9 mg/dL (ref 8.9–10.3)
Creatinine, Ser: 0.94 mg/dL (ref 0.44–1.00)
GFR calc Af Amer: >60 mL/min (ref >60)
GFR calc non Af Amer: 59 mL/min — ABNORMAL LOW (ref >60)
GLUCOSE: 86 mg/dL (ref 65–99)
POTASSIUM: 3.6 mmol/L (ref 3.5–5.1)
Sodium: 137 mmol/L (ref 135–145)
TOTAL PROTEIN: 7.7 g/dL (ref 6.5–8.1)

## 2018-04-14 NOTE — Progress Notes (Signed)
[facility] day:  04/14/2018   Chief Complaint: Molly Perez is a 72 y.o. 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  HPI:  The patient was last seen in the hematology clinic on 01/13/2018 by Honor Loh, NP.  At that time, she was doing well. She complained of cold symptoms over the last 2 weeks.  She denied fevers. Exam was stable.  Hemoglobin was13.0, hematocrit 38.5, and platelet count was 395,000.  Hydrea continued at 500 mg twice daily times 4 days a week (M, W, F, S), and 500 mg daily times 3 days a week (T, Th, Su) for a total of 11 pills a week.  She continued a baby aspirin.  During the interim, patient is doing well. She notes that she has generalized pain "every once in awhile". Patient continues to have exertional dyspnea. Reflux is controlled on current prescribed interventions. She has intermittent constipation. Restless legs are controlled.   Patient denies that she has experienced any B symptoms. She denies any interval infections. Patient maintains an adequate appetite, and notes that she is eating well. Weight, compared to her last visit to the clinic, has decreased by 2 pounds.   Patient denies pain in the clinic today. She continues hydroxyurea 1000 mg BID x 4 days/week (M,W,F,S) and 500 mg once daily x 3 days/week (T,Th,Su) for a total weekly dose of 11 tablets.   Past Medical History:  Diagnosis Date  . Cataract cortical, senile   . GERD (gastroesophageal reflux disease)   . Hypercholesterolemia   . Obesity   . Pre-diabetes   . Prediabetes   . Vitamin D deficiency      Past Surgical History:  Procedure Laterality Date  . ABDOMINAL HYSTERECTOMY    . COLONOSCOPY    . COLONOSCOPY WITH PROPOFOL N/A 07/04/2016   Procedure: COLONOSCOPY WITH PROPOFOL;  Surgeon: Lollie Sails, MD;  Location: [facility] ENDOSCOPY;  Service: Endoscopy;  Laterality: N/A;  .  ESOPHAGOGASTRODUODENOSCOPY (EGD) WITH PROPOFOL N/A 07/04/2016   Procedure: ESOPHAGOGASTRODUODENOSCOPY (EGD) WITH PROPOFOL;  Surgeon: Lollie Sails, MD;  Location: [facility] ENDOSCOPY;  Service: Endoscopy;  Laterality: N/A;    Family History  Problem Relation Age of Onset  . Alzheimer's disease Father   . Congestive Heart Failure Mother   . Diabetes Mother   . Multiple sclerosis Brother   . Lupus Grandchild   . Stroke Grandchild   . Breast cancer Neg Hx     Social History:  reports that she has never smoked. Her smokeless tobacco use includes snuff. She reports that she does not drink alcohol or use drugs.  She lives in [place].  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  She retired at age 77.  She worked for [employer].  She denies any exposure to radiation or toxins.  She is concerned about finances and paying for new medications.  She is on a fixed income.  The patient is alone today.  Allergies:  Allergies  Allergen Reactions  . Amoxicillin Other (See Comments)  . Lipitor [Atorvastatin] Other (See Comments)    Weakness in legs Weakness in legs  . Penicillins Other (See Comments)    Pt states medication upsets her stomach    Current Medications: Current Outpatient Medications  Medication Sig Dispense Refill  . aspirin EC 81 MG tablet Take 81 mg by mouth daily.    . Cholecalciferol (VITAMIN D3) 5000 UNITS CAPS  Take 1 capsule by mouth daily.    . hydroxyurea (HYDREA) 500 MG capsule May take with food to minimize GI side effects. TAKE 2 CAP (1000 MG) MON, WED, FRI, SAT AND TAKE 1 CAPS (500 MG) ON TUE, THURS, SUN. 110 capsule 0  . pravastatin (PRAVACHOL) 80 MG tablet Take by mouth.    . vitamin B-12 (CYANOCOBALAMIN) 1000 MCG tablet Take 1 tablet (1,000 mcg total) by mouth daily. 30 tablet 0  . acetaminophen (TYLENOL) 500 MG tablet Take 500 mg by mouth every 8 (eight) hours as needed for moderate pain.     . meloxicam (MOBIC) 15 MG tablet Take 1  tablet (15 mg total) by mouth daily as needed. (Patient not taking: Reported on 04/14/2018) 30 tablet 0  . pantoprazole (PROTONIX) 40 MG tablet Take 40 mg by mouth daily.     No current facility-administered medications for this visit.     Review of Systems  Constitutional: Negative for diaphoresis, fever, malaise/fatigue and weight loss (down 2 pounds).       "I am alright".  HENT: Positive for nosebleeds. Negative for sore throat.        Cataracts  Eyes: Negative.   Respiratory: Positive for shortness of breath (exertional). Negative for cough, hemoptysis and sputum production.   Cardiovascular: Negative for chest pain, palpitations, orthopnea, leg swelling and PND.  Gastrointestinal: Positive for constipation and heartburn (controlled on PPI). Negative for abdominal pain, blood in stool, diarrhea, melena, nausea and vomiting.  Genitourinary: Negative for dysuria, frequency, hematuria and urgency.  Musculoskeletal: Positive for joint pain. Negative for back pain, falls and myalgias.       Restless legs; improved  Skin: Negative for itching and rash.  Neurological: Negative for dizziness, tremors, weakness and headaches.  Endo/Heme/Allergies: Does not bruise/bleed easily.       Pre-diabetes  Psychiatric/Behavioral: Negative for depression, memory loss and suicidal ideas. The patient is not nervous/anxious and does not have insomnia.   All other systems reviewed and are negative.  Performance status (ECOG): 1 - Symptomatic but completely ambulatory  Physical Exam: Blood pressure 137/85, pulse 73, temperature (!) 97.3 F (36.3 C), temperature source Tympanic, resp. rate 20, height 5' 2" (1.575 m), weight 202 lb 13.2 oz (92 kg). GENERAL:  Well developed, well nourished, woman sitting comfortably in the exam room in no acute distress. MENTAL STATUS:  Alert and oriented to person, place and time. HEAD:  Long brown hair.  Normocephalic, atraumatic, face symmetric, no Cushingoid  features. EYES:  Glasses.  Brown eyes.  Pupils equal round and reactive to light and accomodation.  No conjunctivitis or scleral icterus. ENT:  Oropharynx clear without lesion.  Tongue normal. Poor dentition.  Mucous membranes moist.  RESPIRATORY:  Clear to auscultation without rales, wheezes or rhonchi. CARDIOVASCULAR:  Regular rate and rhythm without murmur, rub or gallop. ABDOMEN:  Soft, non-tender, with active bowel sounds, and no hepatosplenomegaly.  No masses. SKIN:  No rashes, ulcers or lesions. EXTREMITIES: No edema, no skin discoloration or tenderness.  No palpable cords. LYMPH NODES: No palpable cervical, supraclavicular, axillary or inguinal adenopathy  NEUROLOGICAL: Unremarkable. PSYCH:  Appropriate.    Appointment on 04/14/2018  Component Date Value Ref Range Status  . Sodium 04/14/2018 137  135 - 145 mmol/L Final  . Potassium 04/14/2018 3.6  3.5 - 5.1 mmol/L Final  . Chloride 04/14/2018 104  101 - 111 mmol/L Final  . CO2 04/14/2018 24  22 - 32 mmol/L Final  . Glucose, Bld 04/14/2018 86  65 -  99 mg/dL Final  . BUN 04/14/2018 12  6 - 20 mg/dL Final  . Creatinine, Ser 04/14/2018 0.94  0.44 - 1.00 mg/dL Final  . Calcium 04/14/2018 9.0  8.9 - 10.3 mg/dL Final  . Total Protein 04/14/2018 7.7  6.5 - 8.1 g/dL Final  . Albumin 04/14/2018 3.5  3.5 - 5.0 g/dL Final  . AST 04/14/2018 21  15 - 41 U/L Final  . ALT 04/14/2018 14  14 - 54 U/L Final  . Alkaline Phosphatase 04/14/2018 42  38 - 126 U/L Final  . Total Bilirubin 04/14/2018 0.5  0.3 - 1.2 mg/dL Final  . GFR calc non Af Amer 04/14/2018 59* >60 mL/min Final  . GFR calc Af Amer 04/14/2018 >60  >60 mL/min Final   Comment: (NOTE) The eGFR has been calculated using the CKD EPI equation. This calculation has not been validated in all clinical situations. eGFR's persistently <60 mL/min signify possible Chronic Kidney Disease.   Georgiann Hahn gap 04/14/2018 9  5 - 15 Final   Performed at [facility], [address], [place], [place] [address]  . WBC 04/14/2018 7.8  3.6 - 11.0 K/uL Final  . RBC 04/14/2018 3.79* 3.80 - 5.20 MIL/uL Final  . Hemoglobin 04/14/2018 13.3  12.0 - 16.0 g/dL Final  . HCT 04/14/2018 39.2  35.0 - 47.0 % Final  . MCV 04/14/2018 103.5* 80.0 - 100.0 fL Final  . MCH 04/14/2018 35.1* 26.0 - 34.0 pg Final  . MCHC 04/14/2018 33.9  32.0 - 36.0 g/dL Final  . RDW 04/14/2018 14.2  11.5 - 14.5 % Final  . Platelets 04/14/2018 355  150 - 440 K/uL Final  . Neutrophils Relative % 04/14/2018 55  % Final  . Neutro Abs 04/14/2018 4.3  1.4 - 6.5 K/uL Final  . Lymphocytes Relative 04/14/2018 35  % Final  . Lymphs Abs 04/14/2018 2.7  1.0 - 3.6 K/uL Final  . Monocytes Relative 04/14/2018 8  % Final  . Monocytes Absolute 04/14/2018 0.6  0.2 - 0.9 K/uL Final  . Eosinophils Relative 04/14/2018 1  % Final  . Eosinophils Absolute 04/14/2018 0.0  0 - 0.7 K/uL Final  . Basophils Relative 04/14/2018 1  % Final  . Basophils Absolute 04/14/2018 0.1  0 - 0.1 K/uL Final   Performed at [facility], [address], [place], [place] [address]    Assessment:  Molly Perez is a 72 y.o. 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  She has a history of mild leukocytosis, thromobocytosis, and erythrocytosis.   Platelet count has ranged between 512,000 - 622,000 since 03/24/2016.  WBC has been mildly elevated (10,400 - 10,600).  Hematocrit was normal until 09/18/2016.  At that time, hematocrit was 50.7 with a hemoglobin of 16.6.  She is on a baby aspirin.  Work-up on 11/05/2016 revealed a hematocrit of 43.7, hemoglobin 14.3, platelets 583,00, WBC 9800 with an ANC of 6600.  Normal studies included:  Ferritin, iron studies, and erythropoietin level.  JAK2 was positive for V617F.  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  Marrow was normocellular to mildy hypercellular for age (40-50%)  with trilineage hematopoiesis including a proliferation of atypical megakaryocytes with focal clusters and no increase in blasts.  There was no significant increase in marrow reticulin fibers.  Storage iron was present.  Cytogenetics were normal (69, XX).  Flow cytometry was negative.  FISH studies for  MPN/CML were negative.  She is on hydroxyurea (began 12/21/2016).  She is taking 1 tablet 4 days/week and 2 tablets 3 days a week (total 11 tablets/week).  She denies any side effects.  Left lower extremity duplex on 01/26/2017 revealed no evidence of a DVT.  EGD on 07/04/2016 revealed reflux esophagitis, H pylori gastritis, and a small hiatal hernia.  H pylori was treated with metronidazole, clarithromycin, and omeprazole. Colonoscopy on 07/04/2016 was normal.  Symptomatically, she is doing well overall.  She has no acute complaints. Chronic exertional dyspnea and constipation persists.  Exam is stable.  Platelet count is 355,000.  Plan: 1. Labs today:  CBC with diff, CMP. 2. Discuss labs today.  Platelet count is 355,000 today.  Goal platelet count is <= 400,000. Continue Hydroxyurea 1000 mg BID x 4 days/week (M,W,F,S) and 500 mg once daily x 3 days/week (T,Th,Su) for a total weekly dose of 11 tablets. 3. Continue baby aspirin a day. 4. RTC in 3 months for MD assessment and labs (CBC with diff, CMP).   Honor Loh, NP  04/14/2018, 3:18 PM   I saw and evaluated the patient, participating in the key portions of the service and reviewing pertinent diagnostic studies and records.  I reviewed the nurse practitioner's note and agree with the findings and the plan.  The assessment and plan were discussed with the patient.  Several questions were asked by the patient and answered.   Nolon Stalls, MD 04/14/2018,3:18 PM

## 2018-04-17 ENCOUNTER — Other Ambulatory Visit: Payer: Self-pay | Admitting: Family Medicine

## 2018-04-17 ENCOUNTER — Other Ambulatory Visit: Payer: Self-pay | Admitting: Urgent Care

## 2018-04-17 DIAGNOSIS — D473 Essential (hemorrhagic) thrombocythemia: Secondary | ICD-10-CM

## 2018-05-06 ENCOUNTER — Other Ambulatory Visit: Payer: Self-pay | Admitting: Urgent Care

## 2018-05-06 DIAGNOSIS — D473 Essential (hemorrhagic) thrombocythemia: Secondary | ICD-10-CM

## 2018-05-06 NOTE — Telephone Encounter (Signed)
  Please call patient and see if she needs refills and how she is taking hydroxyurea.  Please make adjustments in RX if necessary.  M

## 2018-07-12 ENCOUNTER — Other Ambulatory Visit: Payer: Self-pay | Admitting: Pediatrics

## 2018-07-12 DIAGNOSIS — R921 Mammographic calcification found on diagnostic imaging of breast: Secondary | ICD-10-CM

## 2018-07-14 ENCOUNTER — Inpatient Hospital Stay (HOSPITAL_BASED_OUTPATIENT_CLINIC_OR_DEPARTMENT_OTHER): Payer: Medicare HMO | Admitting: Hematology and Oncology

## 2018-07-14 ENCOUNTER — Encounter: Payer: Self-pay | Admitting: Hematology and Oncology

## 2018-07-14 ENCOUNTER — Inpatient Hospital Stay: Payer: Medicare HMO | Attending: Hematology and Oncology

## 2018-07-14 ENCOUNTER — Other Ambulatory Visit: Payer: Self-pay

## 2018-07-14 VITALS — BP 151/85 | HR 66 | Temp 95.9°F | Resp 18 | Wt 210.8 lb

## 2018-07-14 DIAGNOSIS — Z79899 Other long term (current) drug therapy: Secondary | ICD-10-CM

## 2018-07-14 DIAGNOSIS — D473 Essential (hemorrhagic) thrombocythemia: Secondary | ICD-10-CM | POA: Diagnosis present

## 2018-07-14 LAB — COMPREHENSIVE METABOLIC PANEL
ALBUMIN: 3.4 g/dL — AB (ref 3.5–5.0)
ALK PHOS: 40 U/L (ref 38–126)
ALT: 14 U/L (ref 0–44)
AST: 19 U/L (ref 15–41)
Anion gap: 8 (ref 5–15)
BILIRUBIN TOTAL: 0.3 mg/dL (ref 0.3–1.2)
BUN: 12 mg/dL (ref 8–23)
CO2: 25 mmol/L (ref 22–32)
Calcium: 8.8 mg/dL — ABNORMAL LOW (ref 8.9–10.3)
Chloride: 106 mmol/L (ref 98–111)
Creatinine, Ser: 0.98 mg/dL (ref 0.44–1.00)
GFR calc Af Amer: >60 mL/min (ref >60)
GFR calc non Af Amer: 56 mL/min — ABNORMAL LOW (ref >60)
GLUCOSE: 95 mg/dL (ref 70–99)
POTASSIUM: 3.6 mmol/L (ref 3.5–5.1)
SODIUM: 139 mmol/L (ref 135–145)
TOTAL PROTEIN: 7.7 g/dL (ref 6.5–8.1)

## 2018-07-14 LAB — CBC WITH DIFFERENTIAL/PLATELET
BASOS ABS: 0 10*3/uL (ref 0–0.1)
BASOS PCT: 1 %
EOS ABS: 0 10*3/uL (ref 0–0.7)
Eosinophils Relative: 1 %
HCT: 40.6 % (ref 35.0–47.0)
HEMOGLOBIN: 13.1 g/dL (ref 12.0–16.0)
Lymphocytes Relative: 34 %
Lymphs Abs: 2.3 10*3/uL (ref 1.0–3.6)
MCH: 34.3 pg — ABNORMAL HIGH (ref 26.0–34.0)
MCHC: 32.4 g/dL (ref 32.0–36.0)
MCV: 105.7 fL — ABNORMAL HIGH (ref 80.0–100.0)
Monocytes Absolute: 0.5 10*3/uL (ref 0.2–0.9)
Monocytes Relative: 8 %
NEUTROS ABS: 3.9 10*3/uL (ref 1.4–6.5)
NEUTROS PCT: 56 %
Platelets: 355 10*3/uL (ref 150–440)
RBC: 3.84 MIL/uL (ref 3.80–5.20)
RDW: 14.1 % (ref 11.5–14.5)
WBC: 6.7 10*3/uL (ref 3.6–11.0)

## 2018-07-14 NOTE — Progress Notes (Signed)
Here for follow up. Per pt " I feel good ,except when I do things.Marland Kitchenibuprofen get tired "

## 2018-07-14 NOTE — Progress Notes (Signed)
[facility] day:  07/14/2018   Chief Complaint: Molly Perez is a 72 y.o. 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  HPI:  The patient was last seen in the hematology clinic on 04/14/2018.  At that time, she was doing well overall.  She had no acute complaints. Chronic exertional dyspnea and constipation persisted.  Exam was stable.  Platelet count was 355,000.  She continued Hydroxyurea 1000 mg BID x 4 days/week (M,W,F,S) and 500 mg once daily x 3 days/week (T,Th,Su) for a total weekly dose of 11 tablets.  During the interim, she notes "aches and pains".  She notes left sided back pain (4 out of 10 today).  She denies any bleeding or thrombosis.   She denies any erythromelalgia.  She continues to take her hydroxyurea.  She denies any nausea or vomiting.   Past Medical History:  Diagnosis Date  . Cataract cortical, senile   . GERD (gastroesophageal reflux disease)   . Hypercholesterolemia   . Obesity   . Pre-diabetes   . Prediabetes   . Vitamin D deficiency      Past Surgical History:  Procedure Laterality Date  . ABDOMINAL HYSTERECTOMY    . COLONOSCOPY    . COLONOSCOPY WITH PROPOFOL N/A 07/04/2016   Procedure: COLONOSCOPY WITH PROPOFOL;  Surgeon: Lollie Sails, MD;  Location: [facility] ENDOSCOPY;  Service: Endoscopy;  Laterality: N/A;  . ESOPHAGOGASTRODUODENOSCOPY (EGD) WITH PROPOFOL N/A 07/04/2016   Procedure: ESOPHAGOGASTRODUODENOSCOPY (EGD) WITH PROPOFOL;  Surgeon: Lollie Sails, MD;  Location: [facility] ENDOSCOPY;  Service: Endoscopy;  Laterality: N/A;    Family History  Problem Relation Age of Onset  . Alzheimer's disease Father   . Congestive Heart Failure Mother   . Diabetes Mother   . Multiple sclerosis Brother   . Lupus Grandchild   . Stroke Grandchild   . Breast cancer Neg Hx     Social History:  reports that she has never smoked. Her smokeless tobacco use includes  snuff. She reports that she does not drink alcohol or use drugs.  She lives in [place].  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  She worked for [employer].  She denies any exposure to radiation or toxins.  She is concerned about finances and paying for new medications.  She is on a fixed income.  The patient is alone today.  Allergies:  Allergies  Allergen Reactions  . Amoxicillin Other (See Comments)  . Lipitor [Atorvastatin] Other (See Comments)    Weakness in legs Weakness in legs  . Penicillins Other (See Comments)    Pt states medication upsets her stomach    Current Medications: Current Outpatient Medications  Medication Sig Dispense Refill  . aspirin EC 81 MG tablet Take 81 mg by mouth daily.    . Cholecalciferol (VITAMIN D3) 5000 UNITS CAPS Take 1 capsule by mouth daily.    . hydroxyurea (HYDREA) 500 MG capsule TAKE 2 CAP MON, WED, FRI, SAT AND TAKE 1 CAPS (500 MG) ON TUE, THURS, SUN. 132 capsule 0  . pantoprazole (PROTONIX) 40 MG tablet Take 40 mg by mouth daily.    . pravastatin (PRAVACHOL) 80 MG tablet Take by mouth.    . vitamin B-12 (CYANOCOBALAMIN) 1000 MCG tablet Take 1 tablet (1,000 mcg total) by mouth daily. 30 tablet 0  . acetaminophen (TYLENOL) 500 MG tablet Take 500 mg by mouth  every 8 (eight) hours as needed for moderate pain.     Marland Kitchen ondansetron (ZOFRAN) 8 MG tablet Take by mouth.     No current facility-administered medications for this visit.     Review of Systems  Constitutional: Negative for chills, diaphoresis, fever, malaise/fatigue and weight loss (up 9 pounds).       Feels "ok, aches and pains".  HENT: Negative for congestion, ear discharge, ear pain, nosebleeds, sinus pain, sore throat and tinnitus.   Eyes: Negative.  Negative for double vision, photophobia, pain, discharge and redness.  Respiratory: Positive for shortness of breath (exertional). Negative for cough, hemoptysis and sputum  production.   Cardiovascular: Negative.  Negative for chest pain, palpitations, orthopnea, leg swelling and PND.  Gastrointestinal: Positive for constipation. Negative for abdominal pain, blood in stool, diarrhea, heartburn, melena, nausea and vomiting.  Genitourinary: Negative.  Negative for dysuria, frequency, hematuria and urgency.  Musculoskeletal: Positive for joint pain. Negative for back pain, falls, myalgias and neck pain.       Aches and pains.  No restless legs.  Skin: Negative.  Negative for itching and rash.  Neurological: Negative for dizziness, tingling, tremors, sensory change, speech change, focal weakness, weakness and headaches.  Endo/Heme/Allergies: Does not bruise/bleed easily.       Pre-diabetes  Psychiatric/Behavioral: Negative for depression and memory loss. The patient is not nervous/anxious and does not have insomnia.   All other systems reviewed and are negative.  Performance status (ECOG): 1  Physical Exam: Blood pressure (!) 151/85, pulse 66, temperature (!) 95.9 F (35.5 C), temperature source Tympanic, resp. rate 18, weight 210 lb 12.2 oz (95.6 kg). GENERAL:  Well developed, well nourished, woman sitting comfortably in the exam room in no acute distress. MENTAL STATUS:  Alert and oriented to person, place and time. HEAD:  Long brown hair in a pony tail.  Normocephalic, atraumatic, face symmetric, no Cushingoid features. EYES:  Glasses.  Brown eyes.  Pupils equal round and reactive to light and accomodation.  No conjunctivitis or scleral icterus. ENT:  Oropharynx clear without lesion.  Tongue normal. Mucous membranes moist.  RESPIRATORY:  Clear to auscultation without rales, wheezes or rhonchi. CARDIOVASCULAR:  Regular rate and rhythm without murmur, rub or gallop. ABDOMEN:  Soft, non-tender, with active bowel sounds, and no hepatosplenomegaly.  No masses. SKIN:  No rashes, ulcers or lesions. EXTREMITIES: No edema, no skin discoloration or tenderness.  No  palpable cords. LYMPH NODES: No palpable cervical, supraclavicular, axillary or inguinal adenopathy  NEUROLOGICAL: Unremarkable. PSYCH:  Appropriate.    Appointment on 07/14/2018  Component Date Value Ref Range Status  . WBC 07/14/2018 6.7  3.6 - 11.0 K/uL Final  . RBC 07/14/2018 3.84  3.80 - 5.20 MIL/uL Final  . Hemoglobin 07/14/2018 13.1  12.0 - 16.0 g/dL Final  . HCT 07/14/2018 40.6  35.0 - 47.0 % Final  . MCV 07/14/2018 105.7* 80.0 - 100.0 fL Final  . MCH 07/14/2018 34.3* 26.0 - 34.0 pg Final  . MCHC 07/14/2018 32.4  32.0 - 36.0 g/dL Final  . RDW 07/14/2018 14.1  11.5 - 14.5 % Final  . Platelets 07/14/2018 355  150 - 440 K/uL Final  . Neutrophils Relative % 07/14/2018 56  % Final  . Neutro Abs 07/14/2018 3.9  1.4 - 6.5 K/uL Final  . Lymphocytes Relative 07/14/2018 34  % Final  . Lymphs Abs 07/14/2018 2.3  1.0 - 3.6 K/uL Final  . Monocytes Relative 07/14/2018 8  % Final  . Monocytes Absolute 07/14/2018 0.5  0.2 - 0.9 K/uL Final  . Eosinophils Relative 07/14/2018 1  % Final  . Eosinophils Absolute 07/14/2018 0.0  0 - 0.7 K/uL Final  . Basophils Relative 07/14/2018 1  % Final  . Basophils Absolute 07/14/2018 0.0  0 - 0.1 K/uL Final   Performed at [facility], [address], [place], [place] [address]  . Sodium 07/14/2018 139  135 - 145 mmol/L Final  . Potassium 07/14/2018 3.6  3.5 - 5.1 mmol/L Final  . Chloride 07/14/2018 106  98 - 111 mmol/L Final  . CO2 07/14/2018 25  22 - 32 mmol/L Final  . Glucose, Bld 07/14/2018 95  70 - 99 mg/dL Final  . BUN 07/14/2018 12  8 - 23 mg/dL Final  . Creatinine, Ser 07/14/2018 0.98  0.44 - 1.00 mg/dL Final  . Calcium 07/14/2018 8.8* 8.9 - 10.3 mg/dL Final  . Total Protein 07/14/2018 7.7  6.5 - 8.1 g/dL Final  . Albumin 07/14/2018 3.4* 3.5 - 5.0 g/dL Final  . AST 07/14/2018 19  15 - 41 U/L Final  . ALT 07/14/2018 14  0 - 44 U/L Final  . Alkaline Phosphatase 07/14/2018 40  38 - 126 U/L Final  . Total Bilirubin 07/14/2018 0.3   0.3 - 1.2 mg/dL Final  . GFR calc non Af Amer 07/14/2018 56* >60 mL/min Final  . GFR calc Af Amer 07/14/2018 >60  >60 mL/min Final   Comment: (NOTE) The eGFR has been calculated using the CKD EPI equation. This calculation has not been validated in all clinical situations. eGFR's persistently <60 mL/min signify possible Chronic Kidney Disease.   Georgiann Hahn gap 07/14/2018 8  5 - 15 Final   Performed at [facility], [address], [place], [place] [address]    Assessment:  Molly Perez is a 72 y.o. 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  She has a history of mild leukocytosis, thromobocytosis, and erythrocytosis.   Platelet count has ranged between 512,000 - 622,000 since 03/24/2016.  WBC has been mildly elevated (10,400 - 10,600).  Hematocrit was normal until 09/18/2016.  At that time, hematocrit was 50.7 with a hemoglobin of 16.6.  She is on a baby aspirin.  Work-up on 11/05/2016 revealed a hematocrit of 43.7, hemoglobin 14.3, platelets 583,00, WBC 9800 with an ANC of 6600.  Normal studies included:  Ferritin, iron studies, and erythropoietin level.  JAK2 was positive for V617F.  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  Marrow was normocellular to mildy hypercellular for age (40-50%) with trilineage hematopoiesis including a proliferation of atypical megakaryocytes with focal clusters and no increase in blasts.  There was no significant increase in marrow reticulin fibers.  Storage iron was present.  Cytogenetics were normal (91, XX).  Flow cytometry was negative.  FISH studies for MPN/CML were negative.  She is on hydroxyurea (began 12/21/2016).  She is taking 1 tablet 4 days/week and 2 tablets 3 days a week (total 11 tablets/week).  She denies any side effects.  Left lower extremity duplex on 01/26/2017 revealed no evidence of a DVT.  EGD on  07/04/2016 revealed reflux esophagitis, H pylori gastritis, and a small hiatal hernia.  H pylori was treated with metronidazole, clarithromycin, and omeprazole. Colonoscopy on 07/04/2016 was normal.  Symptomatically, she notes 'aches and pains".  She denies any bleeding or thrombosis.  She denies any erythromelalgia.  Platelet count is 355,000.  Plan: 1.  Labs today:  CBC with diff, CMP. 2. Essential thrombocytosis:  Platelet count is stable.  Platelet count goal is < 400,000.  Continue hydroxyurea 1000 mg BID x 4 days/week (M,W,F,S) and 500 mg once daily x 3 days/week (T,Th,Su).  Continue baby aspirin. 3.  RTC in 3 months for MD assessment and labs (CBC with diff, CMP).   Lequita Asal, MD  07/14/2018, 11:16 AM

## 2018-07-29 ENCOUNTER — Ambulatory Visit
Admission: RE | Admit: 2018-07-29 | Discharge: 2018-07-29 | Disposition: A | Discharge status: Home / Self Care | Payer: Medicare HMO | Source: Ambulatory Visit | Attending: Pediatrics | Admitting: Pediatrics

## 2018-07-29 DIAGNOSIS — R921 Mammographic calcification found on diagnostic imaging of breast: Secondary | ICD-10-CM | POA: Insufficient documentation

## 2018-08-04 ENCOUNTER — Other Ambulatory Visit: Payer: Self-pay | Admitting: Urgent Care

## 2018-08-04 DIAGNOSIS — D473 Essential (hemorrhagic) thrombocythemia: Secondary | ICD-10-CM

## 2018-10-13 ENCOUNTER — Inpatient Hospital Stay: Payer: Medicare HMO | Attending: Hematology and Oncology | Admitting: Hematology and Oncology

## 2018-10-13 ENCOUNTER — Encounter: Payer: Self-pay | Admitting: Hematology and Oncology

## 2018-10-13 ENCOUNTER — Ambulatory Visit
Admission: RE | Admit: 2018-10-13 | Discharge: 2018-10-13 | Disposition: A | Discharge status: Home / Self Care | Payer: Medicare HMO | Source: Ambulatory Visit | Attending: Urgent Care | Admitting: Urgent Care

## 2018-10-13 ENCOUNTER — Inpatient Hospital Stay: Payer: Medicare HMO

## 2018-10-13 ENCOUNTER — Ambulatory Visit
Admission: RE | Admit: 2018-10-13 | Discharge: 2018-10-13 | Disposition: A | Discharge status: Home / Self Care | Payer: Medicare HMO | Attending: Urgent Care | Admitting: Urgent Care

## 2018-10-13 VITALS — BP 162/91 | HR 71 | Temp 96.9°F | Resp 16 | Wt 214.1 lb

## 2018-10-13 DIAGNOSIS — D473 Essential (hemorrhagic) thrombocythemia: Secondary | ICD-10-CM | POA: Insufficient documentation

## 2018-10-13 DIAGNOSIS — M25552 Pain in left hip: Secondary | ICD-10-CM | POA: Insufficient documentation

## 2018-10-13 DIAGNOSIS — Z8249 Family history of ischemic heart disease and other diseases of the circulatory system: Secondary | ICD-10-CM | POA: Diagnosis not present

## 2018-10-13 DIAGNOSIS — Z7982 Long term (current) use of aspirin: Secondary | ICD-10-CM

## 2018-10-13 DIAGNOSIS — M419 Scoliosis, unspecified: Secondary | ICD-10-CM | POA: Diagnosis not present

## 2018-10-13 DIAGNOSIS — Z79899 Other long term (current) drug therapy: Secondary | ICD-10-CM | POA: Insufficient documentation

## 2018-10-13 DIAGNOSIS — R7303 Prediabetes: Secondary | ICD-10-CM | POA: Insufficient documentation

## 2018-10-13 LAB — CBC WITH DIFFERENTIAL/PLATELET
Abs Immature Granulocytes: 0.02 10*3/uL (ref 0.00–0.07)
Basophils Absolute: 0.1 10*3/uL (ref 0.0–0.1)
Basophils Relative: 1 %
Eosinophils Absolute: 0.1 10*3/uL (ref 0.0–0.5)
Eosinophils Relative: 1 %
HCT: 40.3 % (ref 36.0–46.0)
Hemoglobin: 13.1 g/dL (ref 12.0–15.0)
Immature Granulocytes: 0 %
Lymphocytes Relative: 37 %
Lymphs Abs: 2.5 10*3/uL (ref 0.7–4.0)
MCH: 34.6 pg — ABNORMAL HIGH (ref 26.0–34.0)
MCHC: 32.5 g/dL (ref 30.0–36.0)
MCV: 106.3 fL — ABNORMAL HIGH (ref 80.0–100.0)
Monocytes Absolute: 0.7 10*3/uL (ref 0.1–1.0)
Monocytes Relative: 10 %
Neutro Abs: 3.6 10*3/uL (ref 1.7–7.7)
Neutrophils Relative %: 51 %
Platelets: 364 10*3/uL (ref 150–400)
RBC: 3.79 MIL/uL — ABNORMAL LOW (ref 3.87–5.11)
RDW: 13.5 % (ref 11.5–15.5)
WBC: 6.9 10*3/uL (ref 4.0–10.5)
nRBC: 0 % (ref 0.0–0.2)

## 2018-10-13 LAB — COMPREHENSIVE METABOLIC PANEL
ALT: 16 U/L (ref 0–44)
AST: 19 U/L (ref 15–41)
Albumin: 3.5 g/dL (ref 3.5–5.0)
Alkaline Phosphatase: 38 U/L (ref 38–126)
Anion gap: 8 (ref 5–15)
BUN: 10 mg/dL (ref 8–23)
CO2: 26 mmol/L (ref 22–32)
Calcium: 8.5 mg/dL — ABNORMAL LOW (ref 8.9–10.3)
Chloride: 104 mmol/L (ref 98–111)
Creatinine, Ser: 0.89 mg/dL (ref 0.44–1.00)
GFR calc Af Amer: >60 mL/min (ref >60)
GFR calc non Af Amer: >60 mL/min (ref >60)
Glucose, Bld: 95 mg/dL (ref 70–99)
Potassium: 3.6 mmol/L (ref 3.5–5.1)
Sodium: 138 mmol/L (ref 135–145)
Total Bilirubin: 0.6 mg/dL (ref 0.3–1.2)
Total Protein: 7.8 g/dL (ref 6.5–8.1)

## 2018-10-13 NOTE — Progress Notes (Signed)
Pt. Here today for follow up. Reports increase pain in left leg starting "around Thanksgiving". Denies any other complaints at this time.

## 2018-10-13 NOTE — Progress Notes (Signed)
[facility] day:  10/13/2018   Chief Complaint: Bari Leib is a 72 y.o. 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  HPI:  The patient was last seen in the hematology clinic on 07/14/2018.  At that time, she noted "aches and pains".  She denied any bleeding or thrombosis.  She denied any erythromelalgia.  Platelet count was 355,000.  She continued hydroxyurea 1000 mg BID x 4 days/week (M,W,F,S) and 500 mg once daily x 3 days/week (T,Th,Su) for a total weekly dose of 11 tablets.  During the interim, she notes left hip pain with activity.  She notes scoliosis.  She describes falling into her tub in the interim.  She denies any fevers, sweats or weight loss.   Past Medical History:  Diagnosis Date  . Cataract cortical, senile   . GERD (gastroesophageal reflux disease)   . Hypercholesterolemia   . Obesity   . Pre-diabetes   . Prediabetes   . Vitamin D deficiency      Past Surgical History:  Procedure Laterality Date  . ABDOMINAL HYSTERECTOMY    . COLONOSCOPY    . COLONOSCOPY WITH PROPOFOL N/A 07/04/2016   Procedure: COLONOSCOPY WITH PROPOFOL;  Surgeon: Lollie Sails, MD;  Location: [facility] ENDOSCOPY;  Service: Endoscopy;  Laterality: N/A;  . ESOPHAGOGASTRODUODENOSCOPY (EGD) WITH PROPOFOL N/A 07/04/2016   Procedure: ESOPHAGOGASTRODUODENOSCOPY (EGD) WITH PROPOFOL;  Surgeon: Lollie Sails, MD;  Location: [facility] ENDOSCOPY;  Service: Endoscopy;  Laterality: N/A;    Family History  Problem Relation Age of Onset  . Alzheimer's disease Father   . Congestive Heart Failure Mother   . Diabetes Mother   . Multiple sclerosis Brother   . Lupus Grandchild   . Stroke Grandchild   . Breast cancer Neg Hx     Social History:  reports that she has never smoked. Her smokeless tobacco use includes snuff. She reports that she does not drink alcohol or use drugs.  She lives in [place].  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  She retired at age 88.  She worked for [employer].  She denies any exposure to radiation or toxins.  She is concerned about finances and paying for new medications.  She is on a fixed income.  The patient is alone today.  Allergies:  Allergies  Allergen Reactions  . Amoxicillin Other (See Comments)  . Lipitor [Atorvastatin] Other (See Comments)    Weakness in legs Weakness in legs  . Penicillins Other (See Comments)    Pt states medication upsets her stomach    Current Medications: Current Outpatient Medications  Medication Sig Dispense Refill  . acetaminophen (TYLENOL) 500 MG tablet Take 500 mg by mouth every 8 (eight) hours as needed for moderate pain.     Marland Kitchen aspirin EC 81 MG tablet Take 81 mg by mouth daily.    . Cholecalciferol (VITAMIN D3) 5000 UNITS CAPS Take 1 capsule by mouth daily.    . hydroxyurea (HYDREA) 500 MG capsule TAKE 2 CAP MON, WED, FRI, SAT AND TAKE 1 CAPS (500 MG) ON TUE, THURS, SUN. 132 capsule 0  . pantoprazole (PROTONIX) 40 MG tablet Take 40 mg by mouth daily.    . pravastatin (PRAVACHOL) 80 MG tablet Take by mouth.    . vitamin B-12 (CYANOCOBALAMIN) 1000 MCG tablet Take 1 tablet (1,000 mcg total) by mouth daily. 30 tablet 0  . ondansetron (ZOFRAN) 8 MG  tablet Take by mouth.     No current facility-administered medications for this visit.     Review of Systems  Constitutional: Negative.  Negative for chills, diaphoresis, fever, malaise/fatigue and weight loss (up 4 pounds).       "I'm here".  HENT: Negative.  Negative for congestion, ear pain, nosebleeds, sinus pain, sore throat and tinnitus.   Eyes: Negative.  Negative for double vision, photophobia, pain, discharge and redness.  Respiratory: Positive for shortness of breath (exertional). Negative for cough, hemoptysis and sputum production.   Cardiovascular: Negative.  Negative for chest pain, palpitations, orthopnea, leg swelling and PND.   Gastrointestinal: Negative.  Negative for abdominal pain, blood in stool, constipation, diarrhea, heartburn, melena, nausea and vomiting.  Genitourinary: Negative.  Negative for dysuria, frequency, hematuria and urgency.  Musculoskeletal: Positive for joint pain (left hip pain with activity). Negative for back pain, falls, myalgias and neck pain.       Scoliosis.  Skin: Negative.  Negative for itching and rash.  Neurological: Negative.  Negative for dizziness, tingling, tremors, sensory change, speech change, focal weakness, weakness and headaches.  Endo/Heme/Allergies: Does not bruise/bleed easily.       Pre-diabetes.  Psychiatric/Behavioral: Negative for depression and memory loss. The patient is not nervous/anxious and does not have insomnia.   All other systems reviewed and are negative.  Performance status (ECOG): 1  Physical Exam: Blood pressure (!) 162/91, pulse 71, temperature (!) 96.9 F (36.1 C), temperature source Tympanic, resp. rate 16, weight 214 lb 1.1 oz (97.1 kg), SpO2 99 %. GENERAL:  Well developed, well nourished, woman sitting comfortably in the exam room in no acute distress. MENTAL STATUS:  Alert and oriented to person, place and time. HEAD:  Long brown hair pulled back.  Normocephalic, atraumatic, face symmetric, no Cushingoid features. EYES:  Glasses.  Brown eyes.  Pupils equal round and reactive to light and accomodation.  No conjunctivitis or scleral icterus. ENT:  Oropharynx clear without lesion.  Tongue normal. Mucous membranes moist.  RESPIRATORY:  Clear to auscultation without rales, wheezes or rhonchi. CARDIOVASCULAR:  Regular rate and rhythm without murmur, rub or gallop. ABDOMEN:  Soft, non-tender, with active bowel sounds, and no hepatosplenomegaly.  No masses. SKIN:  No rashes, ulcers or lesions. EXTREMITIES: No edema, no skin discoloration or tenderness.  No palpable cords. LYMPH NODES: No palpable cervical, supraclavicular, axillary or inguinal  adenopathy  NEUROLOGICAL: Unremarkable. PSYCH:  Appropriate.    Appointment on 10/13/2018  Component Date Value Ref Range Status  . Sodium 10/13/2018 138  135 - 145 mmol/L Final  . Potassium 10/13/2018 3.6  3.5 - 5.1 mmol/L Final  . Chloride 10/13/2018 104  98 - 111 mmol/L Final  . CO2 10/13/2018 26  22 - 32 mmol/L Final  . Glucose, Bld 10/13/2018 95  70 - 99 mg/dL Final  . BUN 10/13/2018 10  8 - 23 mg/dL Final  . Creatinine, Ser 10/13/2018 0.89  0.44 - 1.00 mg/dL Final  . Calcium 10/13/2018 8.5* 8.9 - 10.3 mg/dL Final  . Total Protein 10/13/2018 7.8  6.5 - 8.1 g/dL Final  . Albumin 10/13/2018 3.5  3.5 - 5.0 g/dL Final  . AST 10/13/2018 19  15 - 41 U/L Final  . ALT 10/13/2018 16  0 - 44 U/L Final  . Alkaline Phosphatase 10/13/2018 38  38 - 126 U/L Final  . Total Bilirubin 10/13/2018 0.6  0.3 - 1.2 mg/dL Final  . GFR calc non Af Amer 10/13/2018 >60  >60 mL/min Final  .  GFR calc Af Amer 10/13/2018 >60  >60 mL/min Final  . Anion gap 10/13/2018 8  5 - 15 Final   Performed at [facility], [address] [place], [place] [address]  . WBC 10/13/2018 6.9  4.0 - 10.5 K/uL Final  . RBC 10/13/2018 3.79* 3.87 - 5.11 MIL/uL Final  . Hemoglobin 10/13/2018 13.1  12.0 - 15.0 g/dL Final  . HCT 10/13/2018 40.3  36.0 - 46.0 % Final  . MCV 10/13/2018 106.3* 80.0 - 100.0 fL Final  . MCH 10/13/2018 34.6* 26.0 - 34.0 pg Final  . MCHC 10/13/2018 32.5  30.0 - 36.0 g/dL Final  . RDW 10/13/2018 13.5  11.5 - 15.5 % Final  . Platelets 10/13/2018 364  150 - 400 K/uL Final  . nRBC 10/13/2018 0.0  0.0 - 0.2 % Final  . Neutrophils Relative % 10/13/2018 51  % Final  . Neutro Abs 10/13/2018 3.6  1.7 - 7.7 K/uL Final  . Lymphocytes Relative 10/13/2018 37  % Final  . Lymphs Abs 10/13/2018 2.5  0.7 - 4.0 K/uL Final  . Monocytes Relative 10/13/2018 10  % Final  . Monocytes Absolute 10/13/2018 0.7  0.1 - 1.0 K/uL Final  . Eosinophils Relative 10/13/2018 1  % Final  . Eosinophils Absolute 10/13/2018  0.1  0.0 - 0.5 K/uL Final  . Basophils Relative 10/13/2018 1  % Final  . Basophils Absolute 10/13/2018 0.1  0.0 - 0.1 K/uL Final  . Immature Granulocytes 10/13/2018 0  % Final  . Abs Immature Granulocytes 10/13/2018 0.02  0.00 - 0.07 K/uL Final   Performed at [facility], [address], [place], [place] [address]    Assessment:  Ranelle Auker is a 72 y.o. 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  She has a history of mild leukocytosis, thromobocytosis, and erythrocytosis.   Platelet count has ranged between 512,000 - 622,000 since 03/24/2016.  WBC has been mildly elevated (10,400 - 10,600).  Hematocrit was normal until 09/18/2016.  At that time, hematocrit was 50.7 with a hemoglobin of 16.6.  She is on a baby aspirin.  Work-up on 11/05/2016 revealed a hematocrit of 43.7, hemoglobin 14.3, platelets 583,00, WBC 9800 with an ANC of 6600.  Normal studies included:  Ferritin, iron studies, and erythropoietin level.  JAK2 was positive for V617F.  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  Marrow was normocellular to mildy hypercellular for age (40-50%) with trilineage hematopoiesis including a proliferation of atypical megakaryocytes with focal clusters and no increase in blasts.  There was no significant increase in marrow reticulin fibers.  Storage iron was present.  Cytogenetics were normal (70, XX).  Flow cytometry was negative.  FISH studies for MPN/CML were negative.  She is on hydroxyurea (began 12/21/2016).  She is taking 1 tablet 4 days/week and 2 tablets 3 days a week (total 11 tablets/week).  She denies any side effects.  Left lower extremity duplex on 01/26/2017 revealed no evidence of a DVT.  EGD on 07/04/2016 revealed reflux esophagitis, H pylori gastritis, and a small hiatal hernia.  H pylori was treated with metronidazole, clarithromycin, and  omeprazole. Colonoscopy on 07/04/2016 was normal.  Symptomatically, she notes left hip pain.  She denies any excess bruising or bleeding.  She denies erythromelalgia.  Platelet count is 364,000.  Plan: 1. Labs today:  CBC with diff, CMP. 2. Essential thrombocytosis:  Platelet count is 364,000.  Platelet  count goal is < 400,000.  Continue hydroxyurea 1000 mg BID x 4 days/week (M,W,F,S) and 500 mg once daily x 3 days/week (T,Th,Su).  Continue baby aspirin. 3.  Left hip pain:  Patient notes left hip pain during the interim and bathtub fall.  Plain films of left hip. 4.  RTC in 3 months for MD assessment and labs (CBC with diff, CMP).  Addendum:  Left hip and pelvis plain films reveal no acute LEFT hip abnormalities.  There were degenerative disc and facet disease changes at visualized lower lumbar spine.   Molly Asal, MD  10/13/2018, 4:35 PM

## 2018-11-23 ENCOUNTER — Other Ambulatory Visit: Payer: Self-pay | Admitting: Urgent Care

## 2018-11-23 DIAGNOSIS — D473 Essential (hemorrhagic) thrombocythemia: Secondary | ICD-10-CM

## 2018-12-06 DIAGNOSIS — M47816 Spondylosis without myelopathy or radiculopathy, lumbar region: Secondary | ICD-10-CM | POA: Insufficient documentation

## 2019-01-12 ENCOUNTER — Encounter: Payer: Self-pay | Admitting: Urgent Care

## 2019-01-12 ENCOUNTER — Inpatient Hospital Stay: Payer: Medicare HMO

## 2019-01-12 ENCOUNTER — Other Ambulatory Visit: Payer: Self-pay

## 2019-01-12 ENCOUNTER — Inpatient Hospital Stay: Payer: Medicare HMO | Attending: Hematology and Oncology | Admitting: Urgent Care

## 2019-01-12 VITALS — BP 160/88 | HR 79 | Temp 97.9°F | Resp 18 | Ht 63.0 in | Wt 219.4 lb

## 2019-01-12 DIAGNOSIS — D473 Essential (hemorrhagic) thrombocythemia: Secondary | ICD-10-CM | POA: Diagnosis not present

## 2019-01-12 DIAGNOSIS — G8929 Other chronic pain: Secondary | ICD-10-CM | POA: Diagnosis not present

## 2019-01-12 DIAGNOSIS — Z7982 Long term (current) use of aspirin: Secondary | ICD-10-CM | POA: Diagnosis not present

## 2019-01-12 DIAGNOSIS — M8580 Other specified disorders of bone density and structure, unspecified site: Secondary | ICD-10-CM | POA: Diagnosis not present

## 2019-01-12 DIAGNOSIS — M25552 Pain in left hip: Secondary | ICD-10-CM | POA: Insufficient documentation

## 2019-01-12 DIAGNOSIS — M25562 Pain in left knee: Secondary | ICD-10-CM | POA: Diagnosis not present

## 2019-01-12 DIAGNOSIS — E669 Obesity, unspecified: Secondary | ICD-10-CM | POA: Diagnosis not present

## 2019-01-12 DIAGNOSIS — E559 Vitamin D deficiency, unspecified: Secondary | ICD-10-CM | POA: Insufficient documentation

## 2019-01-12 DIAGNOSIS — M25561 Pain in right knee: Secondary | ICD-10-CM

## 2019-01-12 DIAGNOSIS — M8589 Other specified disorders of bone density and structure, multiple sites: Secondary | ICD-10-CM

## 2019-01-12 DIAGNOSIS — Z79899 Other long term (current) drug therapy: Secondary | ICD-10-CM | POA: Diagnosis not present

## 2019-01-12 DIAGNOSIS — E7800 Pure hypercholesterolemia, unspecified: Secondary | ICD-10-CM | POA: Insufficient documentation

## 2019-01-12 DIAGNOSIS — Z5181 Encounter for therapeutic drug level monitoring: Secondary | ICD-10-CM

## 2019-01-12 LAB — COMPREHENSIVE METABOLIC PANEL
ALT: 15 U/L (ref 0–44)
AST: 20 U/L (ref 15–41)
Albumin: 3.2 g/dL — ABNORMAL LOW (ref 3.5–5.0)
Alkaline Phosphatase: 41 U/L (ref 38–126)
Anion gap: 7 (ref 5–15)
BUN: 11 mg/dL (ref 8–23)
CO2: 25 mmol/L (ref 22–32)
Calcium: 8.6 mg/dL — ABNORMAL LOW (ref 8.9–10.3)
Chloride: 106 mmol/L (ref 98–111)
Creatinine, Ser: 0.87 mg/dL (ref 0.44–1.00)
GFR calc Af Amer: >60 mL/min (ref >60)
GFR calc non Af Amer: >60 mL/min (ref >60)
Glucose, Bld: 123 mg/dL — ABNORMAL HIGH (ref 70–99)
Potassium: 3.4 mmol/L — ABNORMAL LOW (ref 3.5–5.1)
Sodium: 138 mmol/L (ref 135–145)
Total Bilirubin: 0.4 mg/dL (ref 0.3–1.2)
Total Protein: 7.2 g/dL (ref 6.5–8.1)

## 2019-01-12 LAB — CBC WITH DIFFERENTIAL/PLATELET
Abs Immature Granulocytes: 0.01 10*3/uL (ref 0.00–0.07)
Basophils Absolute: 0 10*3/uL (ref 0.0–0.1)
Basophils Relative: 1 %
Eosinophils Absolute: 0.1 10*3/uL (ref 0.0–0.5)
Eosinophils Relative: 1 %
HCT: 40.3 % (ref 36.0–46.0)
Hemoglobin: 13.2 g/dL (ref 12.0–15.0)
Immature Granulocytes: 0 %
Lymphocytes Relative: 40 %
Lymphs Abs: 2.4 10*3/uL (ref 0.7–4.0)
MCH: 35.1 pg — ABNORMAL HIGH (ref 26.0–34.0)
MCHC: 32.8 g/dL (ref 30.0–36.0)
MCV: 107.2 fL — ABNORMAL HIGH (ref 80.0–100.0)
Monocytes Absolute: 0.5 10*3/uL (ref 0.1–1.0)
Monocytes Relative: 8 %
Neutro Abs: 2.9 10*3/uL (ref 1.7–7.7)
Neutrophils Relative %: 50 %
Platelets: 357 10*3/uL (ref 150–400)
RBC: 3.76 MIL/uL — ABNORMAL LOW (ref 3.87–5.11)
RDW: 13.3 % (ref 11.5–15.5)
WBC: 5.9 10*3/uL (ref 4.0–10.5)
nRBC: 0 % (ref 0.0–0.2)

## 2019-01-12 NOTE — Progress Notes (Signed)
[facility] day:  01/12/2019   Chief Complaint: Molly Perez is a 73 y.o. 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  HPI:  The patient was last seen in the hematology clinic on 10/13/2018.  At that time, patient was doing well overall.  She complained of pain in her LEFT hip.  She had fallen in the bathtub recently.  No B symptoms or interval infections.  No bruising or bleeding.  Exam was grossly unremarkable.  WBC 6900 ([place] 3600).  Calcium low at 8.5 mg/dL.  She continued hydroxyurea 1000 mg BID x 4 days/week (M,W,F,S) and 500 mg once daily x 3 days/week (T,Th,Su) for a total weekly dose of 11 tablets.  In the interim, patient has been doing well overall.  She denies any acute concerns.  Patient with chronic pain in her BILATERAL knees.  Her RIGHT knee is described as being "bone-on-bone".  Patient has been seen in consult by orthopedics and surgery has been recommended, however patient has been hesitant to this point.  She denies any B symptoms or recent infections. Patient denies bleeding; no hematochezia, melena, or gross hematuria.  Patient remains on hydroxyurea as previously prescribed.  Patient advises that she maintains an adequate appetite. She is eating well. Weight today is 219 lb 5.7 oz (99.5 kg), which compared to her last visit to the clinic, represents a 5 pound weight increase  Patient rates her pain as a 9 out of 10 in clinic today.   Past Medical History:  Diagnosis Date  . Cataract cortical, senile   . GERD (gastroesophageal reflux disease)   . Hypercholesterolemia   . Obesity   . Pre-diabetes   . Prediabetes   . Vitamin D deficiency      Past Surgical History:  Procedure Laterality Date  . ABDOMINAL HYSTERECTOMY    . COLONOSCOPY    . COLONOSCOPY WITH PROPOFOL N/A 07/04/2016   Procedure: COLONOSCOPY WITH PROPOFOL;  Surgeon: Lollie Sails, MD;  Location: [facility]  ENDOSCOPY;  Service: Endoscopy;  Laterality: N/A;  . ESOPHAGOGASTRODUODENOSCOPY (EGD) WITH PROPOFOL N/A 07/04/2016   Procedure: ESOPHAGOGASTRODUODENOSCOPY (EGD) WITH PROPOFOL;  Surgeon: Lollie Sails, MD;  Location: [facility] ENDOSCOPY;  Service: Endoscopy;  Laterality: N/A;    Family History  Problem Relation Age of Onset  . Alzheimer's disease Father   . Congestive Heart Failure Mother   . Diabetes Mother   . Multiple sclerosis Brother   . Lupus Grandchild   . Stroke Grandchild   . Breast cancer Neg Hx     Social History:  reports that she has never smoked. Her smokeless tobacco use includes snuff. She reports that she does not drink alcohol or use drugs.  She lives in [place].  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  She retired at age 38.  She worked for [employer].  She denies any exposure to radiation or toxins.  She is concerned about finances and paying for new medications.  She is on a fixed income.  The patient is alone today.  Allergies:  Allergies  Allergen Reactions  . Amoxicillin Other (See Comments)  . Lipitor [Atorvastatin] Other (See Comments)    Weakness in legs Weakness in legs  . Penicillins Other (See Comments)    Pt states medication upsets her stomach    Current Medications: Current Outpatient Medications  Medication Sig Dispense Refill  . aspirin EC 81 MG tablet Take  81 mg by mouth daily.    . Cholecalciferol (VITAMIN D3) 5000 UNITS CAPS Take 1 capsule by mouth daily.    . hydroxyurea (HYDREA) 500 MG capsule TAKE 2 CAPS MON, WED, FRI, SAT AND TAKE 1 CAPS (500 MG) ON TUE, THURS, SUN. 132 capsule 0  . pantoprazole (PROTONIX) 40 MG tablet Take 40 mg by mouth daily.    . pravastatin (PRAVACHOL) 80 MG tablet Take by mouth.    . vitamin B-12 (CYANOCOBALAMIN) 1000 MCG tablet Take 1 tablet (1,000 mcg total) by mouth daily. 30 tablet 0  . acetaminophen (TYLENOL) 500 MG tablet Take 500 mg by mouth every 8 (eight) hours as needed  for moderate pain.     Marland Kitchen ondansetron (ZOFRAN) 8 MG tablet Take by mouth.     No current facility-administered medications for this visit.     Review of Systems  Constitutional: Negative.  Negative for chills, diaphoresis, fever, malaise/fatigue and weight loss (up 5 pounds).       "I am doing ok, I guess".   HENT: Negative.  Negative for congestion, ear pain, nosebleeds, sinus pain, sore throat and tinnitus.   Eyes: Negative.  Negative for double vision, photophobia, pain, discharge and redness.  Respiratory: Positive for shortness of breath (exertional). Negative for cough, hemoptysis and sputum production.   Cardiovascular: Negative.  Negative for chest pain, palpitations, orthopnea, leg swelling and PND.  Gastrointestinal: Negative.  Negative for abdominal pain, blood in stool, constipation, diarrhea, heartburn, melena, nausea and vomiting.  Genitourinary: Negative.  Negative for dysuria, frequency, hematuria and urgency.  Musculoskeletal: Positive for joint pain (LEFT hips and BILATERAL knees). Negative for back pain, falls, myalgias and neck pain.       PMH (+) for scoliosis.  Skin: Negative.  Negative for itching and rash.  Neurological: Negative.  Negative for dizziness, tingling, tremors, sensory change, speech change, focal weakness, weakness and headaches.  Endo/Heme/Allergies: Does not bruise/bleed easily.  Psychiatric/Behavioral: Negative for depression and memory loss. The patient is not nervous/anxious and does not have insomnia.   All other systems reviewed and are negative.  Performance status (ECOG): 1 - Symptomatic but completely ambulatory  Vital Signs BP (!) 160/88 (BP Location: Right Arm, Patient Position: Sitting)   Pulse 79   Temp 97.9 F (36.6 C) (Tympanic)   Resp 18   Ht 5\' 3"  (1.6 m)   Wt 219 lb 5.7 oz (99.5 kg)   SpO2 99%   BMI 38.86 kg/m   Physical Exam  Constitutional: She is oriented to person, place, and time and well-developed, well-nourished, and  in no distress.  HENT:  Head: Normocephalic and atraumatic.  Mouth/Throat: Oropharynx is clear and moist and mucous membranes are normal.  Eyes: Pupils are equal, round, and reactive to light. EOM are normal. No scleral icterus.  Neck: Normal range of motion. Neck supple. No tracheal deviation present. No thyromegaly present.  Cardiovascular: Normal rate, regular rhythm, normal heart sounds and intact distal pulses. Exam reveals no gallop and no friction rub.  No murmur heard. Pulmonary/Chest: Effort normal and breath sounds normal. No respiratory distress. She has no wheezes. She has no rales.  Abdominal: Soft. Bowel sounds are normal. She exhibits no distension. There is no abdominal tenderness.  Musculoskeletal: Normal range of motion.        General: No tenderness or edema.     Right knee: She exhibits swelling and bony tenderness.     Left knee: She exhibits swelling and bony tenderness.     Comments:  LEFT hip pain  Neurological: She is alert and oriented to person, place, and time. Gait (related to hip and bilateral knee pain) abnormal.  Skin: Skin is warm and dry. No rash noted. No erythema.  Psychiatric: Mood, affect and judgment normal.  Nursing note and vitals reviewed.   Appointment on 01/12/2019  Component Date Value Ref Range Status  . Sodium 01/12/2019 138  135 - 145 mmol/L Final  . Potassium 01/12/2019 3.4* 3.5 - 5.1 mmol/L Final  . Chloride 01/12/2019 106  98 - 111 mmol/L Final  . CO2 01/12/2019 25  22 - 32 mmol/L Final  . Glucose, Bld 01/12/2019 123* 70 - 99 mg/dL Final  . BUN 01/12/2019 11  8 - 23 mg/dL Final  . Creatinine, Ser 01/12/2019 0.87  0.44 - 1.00 mg/dL Final  . Calcium 01/12/2019 8.6* 8.9 - 10.3 mg/dL Final  . Total Protein 01/12/2019 7.2  6.5 - 8.1 g/dL Final  . Albumin 01/12/2019 3.2* 3.5 - 5.0 g/dL Final  . AST 01/12/2019 20  15 - 41 U/L Final  . ALT 01/12/2019 15  0 - 44 U/L Final  . Alkaline Phosphatase 01/12/2019 41  38 - 126 U/L Final  . Total  Bilirubin 01/12/2019 0.4  0.3 - 1.2 mg/dL Final  . GFR calc non Af Amer 01/12/2019 >60  >60 mL/min Final  . GFR calc Af Amer 01/12/2019 >60  >60 mL/min Final  . Anion gap 01/12/2019 7  5 - 15 Final   Performed at [facility], [address], [place], [place] [address]  . WBC 01/12/2019 5.9  4.0 - 10.5 K/uL Final  . RBC 01/12/2019 3.76* 3.87 - 5.11 MIL/uL Final  . Hemoglobin 01/12/2019 13.2  12.0 - 15.0 g/dL Final  . HCT 01/12/2019 40.3  36.0 - 46.0 % Final  . MCV 01/12/2019 107.2* 80.0 - 100.0 fL Final  . MCH 01/12/2019 35.1* 26.0 - 34.0 pg Final  . MCHC 01/12/2019 32.8  30.0 - 36.0 g/dL Final  . RDW 01/12/2019 13.3  11.5 - 15.5 % Final  . Platelets 01/12/2019 357  150 - 400 K/uL Final  . nRBC 01/12/2019 0.0  0.0 - 0.2 % Final  . Neutrophils Relative % 01/12/2019 50  % Final  . Neutro Abs 01/12/2019 2.9  1.7 - 7.7 K/uL Final  . Lymphocytes Relative 01/12/2019 40  % Final  . Lymphs Abs 01/12/2019 2.4  0.7 - 4.0 K/uL Final  . Monocytes Relative 01/12/2019 8  % Final  . Monocytes Absolute 01/12/2019 0.5  0.1 - 1.0 K/uL Final  . Eosinophils Relative 01/12/2019 1  % Final  . Eosinophils Absolute 01/12/2019 0.1  0.0 - 0.5 K/uL Final  . Basophils Relative 01/12/2019 1  % Final  . Basophils Absolute 01/12/2019 0.0  0.0 - 0.1 K/uL Final  . Immature Granulocytes 01/12/2019 0  % Final  . Abs Immature Granulocytes 01/12/2019 0.01  0.00 - 0.07 K/uL Final   Performed at [facility], [address] [place], [place] [address]    Assessment:  Molly Perez is a 73 y.o. 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  She has a history of mild leukocytosis, thromobocytosis, and erythrocytosis.   Platelet count has ranged between 512,000 - 622,000 since 03/24/2016.  WBC has been mildly elevated (10,400 - 10,600).  Hematocrit was normal until 09/18/2016.  At that time, hematocrit was 50.7 with a hemoglobin of 16.6.  She is on a baby  aspirin.  Work-up  on 11/05/2016 revealed a hematocrit of 43.7, hemoglobin 14.3, platelets 583,00, WBC 9800 with an ANC of 6600.  Normal studies included:  Ferritin, iron studies, and erythropoietin level.  JAK2 was positive for V617F.  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  Marrow was normocellular to mildy hypercellular for age (40-50%) with trilineage hematopoiesis including a proliferation of atypical megakaryocytes with focal clusters and no increase in blasts.  There was no significant increase in marrow reticulin fibers.  Storage iron was present.  Cytogenetics were normal (58, XX).  Flow cytometry was negative.  FISH studies for MPN/CML were negative.  She is on hydroxyurea (began 12/21/2016).  She is taking 1 tablet 4 days/week and 2 tablets 3 days a week (total 11 tablets/week).  She denies any side effects.  Left lower extremity duplex on 01/26/2017 revealed no evidence of a DVT.  EGD on 07/04/2016 revealed reflux esophagitis, H pylori gastritis, and a small hiatal hernia.  H pylori was treated with metronidazole, clarithromycin, and omeprazole. Colonoscopy on 07/04/2016 was normal.  Symptomatically, patient doing well overall.  She complains of chronic pain in her LEFT hip and BILATERAL knees.  Patient has been seen in consult by orthopedics, with surgery being recommended.  Patient denies any bruising or bleeding.  No B symptoms.  Eating well; weight up 5 pounds.  Exam is grossly unremarkable.  WBC 5900 ([place] 2900).  Potassium low at 3.4 mmol/L.  Calcium low at 8.6 mg/dL (corrected 9.2 mg/dL).   Plan: 1. Labs today:  CBC with diff, CMP. 2. Essential thrombocytosis  Labs reviewed.  Platelet count stable at 357,000.  Reviewed platelet goal of </= 400,000.  Continue hydroxyurea thousand milligrams twice daily x4 days/week (M,W,F,S) and 500 mg daily x3 days/week (T,Th,Su).   Continue  daily aspirin 81 mg. 3. Osteopenia  Patient with recent falls and complains of increased pain in multiple joints.  Review last bone density from 04/07/2016  T score -1.5 in the right femoral neck.  Ten-year fracture probability by FRAX of 4.1% or greater for hip fracture or 0.6% or greater for major osteoporotic fracture.  Patient is not taking calcium supplement.  Encouraged to start calcium 1200 mg daily.  She is already taking supplemental vitamin D.  Discussed combination 600/400 mg tablet twice daily.  Schedule repeat bone density study. 4. Hip and knee pain  Pain increasing making ambulation difficult and unsafe.  Has experienced mechanical falls.  Encouraged to follow-up with orthopedics for ongoing conversations regarding need for surgical intervention.  Encouraged to discuss pain with PCP, as she likely has osteoarthritis.  Requesting handicap placard  Due to observed difficulties with ambulation, will oblige request for short-term placard.  Application completed and signed by provider today. 5. RTC in 3 months for MD assessment and labs (CBC with diff, CMP).  Honor Loh, NP  01/12/19, 6:14 PM

## 2019-01-12 NOTE — Patient Instructions (Signed)
Need to start calcium + vitamin D. You can get this in a combination pill 600/400. You will need to take 2 daily. Ask your pharmacy for help.  Honor Loh, MSN, APRN, FNP-C, CEN Oncology/Hematology Nurse Practitioner  [place] 01/12/19

## 2019-01-20 ENCOUNTER — Inpatient Hospital Stay: Admission: RE | Admit: 2019-01-20 | Payer: Medicare HMO | Source: Ambulatory Visit

## 2019-02-07 ENCOUNTER — Other Ambulatory Visit: Payer: Self-pay

## 2019-02-07 DIAGNOSIS — D473 Essential (hemorrhagic) thrombocythemia: Secondary | ICD-10-CM

## 2019-02-07 MED ORDER — HYDROXYUREA 500 MG PO CAPS: ORAL_CAPSULE | ORAL | 0 refills | Status: DC

## 2019-02-07 NOTE — Telephone Encounter (Signed)
  Ask patient if she is running out.  She should have quite a fe pills left.  M

## 2019-02-14 ENCOUNTER — Telehealth: Payer: Self-pay | Admitting: *Deleted

## 2019-02-14 ENCOUNTER — Telehealth: Payer: Self-pay

## 2019-02-14 NOTE — Telephone Encounter (Signed)
Spoke with the pharmacy staff at [facility] in [facility] / medication has been verified and at this time no new script or approval is needed at this time.

## 2019-02-14 NOTE — Telephone Encounter (Signed)
  I don't know how to do that as the Rx already went through.  M

## 2019-02-14 NOTE — Telephone Encounter (Signed)
Have your nurse call them with it

## 2019-02-14 NOTE — Telephone Encounter (Signed)
Pharmacy needs diagnosis code for [facility]

## 2019-02-14 NOTE — Telephone Encounter (Signed)
I have called the pharmacy and they didn't need anything. Everything was fine medication has already been refilled

## 2019-02-28 ENCOUNTER — Other Ambulatory Visit: Payer: Medicare HMO

## 2019-04-19 ENCOUNTER — Other Ambulatory Visit: Payer: Medicare HMO

## 2019-04-20 ENCOUNTER — Other Ambulatory Visit: Payer: Self-pay

## 2019-04-20 ENCOUNTER — Inpatient Hospital Stay: Payer: Medicare HMO | Attending: Hematology and Oncology

## 2019-04-20 ENCOUNTER — Ambulatory Visit
Admission: RE | Admit: 2019-04-20 | Discharge: 2019-04-20 | Disposition: A | Discharge status: Home / Self Care | Payer: Medicare HMO | Source: Ambulatory Visit | Attending: Urgent Care | Admitting: Urgent Care

## 2019-04-20 DIAGNOSIS — M8589 Other specified disorders of bone density and structure, multiple sites: Secondary | ICD-10-CM

## 2019-04-20 DIAGNOSIS — Z79899 Other long term (current) drug therapy: Secondary | ICD-10-CM | POA: Insufficient documentation

## 2019-04-20 DIAGNOSIS — D473 Essential (hemorrhagic) thrombocythemia: Secondary | ICD-10-CM | POA: Insufficient documentation

## 2019-04-20 DIAGNOSIS — Z7982 Long term (current) use of aspirin: Secondary | ICD-10-CM | POA: Insufficient documentation

## 2019-04-20 LAB — CBC WITH DIFFERENTIAL/PLATELET
Abs Immature Granulocytes: 0.03 10*3/uL (ref 0.00–0.07)
Basophils Absolute: 0 10*3/uL (ref 0.0–0.1)
Basophils Relative: 0 %
Eosinophils Absolute: 0.1 10*3/uL (ref 0.0–0.5)
Eosinophils Relative: 1 %
HCT: 38.5 % (ref 36.0–46.0)
Hemoglobin: 12.9 g/dL (ref 12.0–15.0)
Immature Granulocytes: 0 %
Lymphocytes Relative: 35 %
Lymphs Abs: 2.7 10*3/uL (ref 0.7–4.0)
MCH: 35.3 pg — ABNORMAL HIGH (ref 26.0–34.0)
MCHC: 33.5 g/dL (ref 30.0–36.0)
MCV: 105.5 fL — ABNORMAL HIGH (ref 80.0–100.0)
Monocytes Absolute: 0.9 10*3/uL (ref 0.1–1.0)
Monocytes Relative: 12 %
Neutro Abs: 3.9 10*3/uL (ref 1.7–7.7)
Neutrophils Relative %: 52 %
Platelets: 348 10*3/uL (ref 150–400)
RBC: 3.65 MIL/uL — ABNORMAL LOW (ref 3.87–5.11)
RDW: 12.8 % (ref 11.5–15.5)
WBC: 7.6 10*3/uL (ref 4.0–10.5)
nRBC: 0 % (ref 0.0–0.2)

## 2019-04-20 LAB — COMPREHENSIVE METABOLIC PANEL
ALT: 19 U/L (ref 0–44)
AST: 22 U/L (ref 15–41)
Albumin: 3.4 g/dL — ABNORMAL LOW (ref 3.5–5.0)
Alkaline Phosphatase: 41 U/L (ref 38–126)
Anion gap: 7 (ref 5–15)
BUN: 11 mg/dL (ref 8–23)
CO2: 25 mmol/L (ref 22–32)
Calcium: 8.8 mg/dL — ABNORMAL LOW (ref 8.9–10.3)
Chloride: 109 mmol/L (ref 98–111)
Creatinine, Ser: 0.88 mg/dL (ref 0.44–1.00)
GFR calc Af Amer: >60 mL/min (ref >60)
GFR calc non Af Amer: >60 mL/min (ref >60)
Glucose, Bld: 82 mg/dL (ref 70–99)
Potassium: 3.5 mmol/L (ref 3.5–5.1)
Sodium: 141 mmol/L (ref 135–145)
Total Bilirubin: 0.3 mg/dL (ref 0.3–1.2)
Total Protein: 7.4 g/dL (ref 6.5–8.1)

## 2019-04-25 ENCOUNTER — Other Ambulatory Visit: Payer: Self-pay

## 2019-04-25 ENCOUNTER — Other Ambulatory Visit: Payer: Self-pay | Admitting: Hematology and Oncology

## 2019-04-25 DIAGNOSIS — D473 Essential (hemorrhagic) thrombocythemia: Secondary | ICD-10-CM

## 2019-04-25 NOTE — Progress Notes (Signed)
[facility]  [address], [address] [place], [place] [address] Phone: [phone]  Fax: [phone]   Telemedicine Office Visit:  04/26/2019  Referring physician: Barbaraann Boys, MD  I connected with Marney Setting on 04/26/2019 at 11:32 AM by videoconferencing and verified that I was speaking with the correct person using 2 identifiers.  The patient was at home.  I discussed the limitations, risk, security and privacy concerns of performing an evaluation and management service by videoconferencing and the availability of in person appointments.  I also discussed with the patient that there may be a patient responsible charge related to this service.  The patient expressed understanding and agreed to proceed.   Chief Complaint: Molly Perez is a 73 y.o. 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  HPI: The patient was last seen in the hematology clinic on 01/12/2019 by NP Honor Loh. At that time, patient was doing well overall.  She complained of chronic pain in her LEFT hip and BILATERAL knees. Patient had been seen in consult by orthopedics, with surgery being recommended.  Patient denied any bruising or bleeding. No B symptoms. She was eating well; weight was up 5 pounds. Exam was grossly unremarkable.  WBC 5900 ([place] 2900).  Potassium low at 3.4.  Calcium was 8.6 (corrected 9.2).  Labs followed: 01/12/2019: WBC 5,900, ANC 2,900, hemoglobin 13.2, hematocrit 40.3, platelets 357,000, Sodium 138, Potassium 3.4, Glucose 123, Creatinine 0.87, calcium 8.6, and Albumin 3.2.  04/20/2019: WBC 7,600, ANC 3,900, hemoglobin 12.9, hematocrit 38.5, platelets 348,000, MCV 105.5.  Calcium 8.8.  Albumin 3.4.   Bone density on 04/20/2019 revealed osteopenia with a T-score of -2.0 in the AP spine L1-L3.   She was seen by Stephens November, NP at the [facility] on 04/18/2019 for reflux esophagitis. She was doing well.   Medications were renewed.  During the interim, the patient states "I feel good." She denies any infections, fever, and headaches. She sweats more than normal, and she has shortness of breath with excertion. Her back pain increases when she wakes up after nighttime. She has no knee pain today, it will bother her with increased mobility. She is taking hydroxyurea.    Past Medical History:  Diagnosis Date  . Cataract cortical, senile   . GERD (gastroesophageal reflux disease)   . Hypercholesterolemia   . Obesity   . Pre-diabetes   . Prediabetes   . Vitamin D deficiency     Past Surgical History:  Procedure Laterality Date  . ABDOMINAL HYSTERECTOMY    . COLONOSCOPY    . COLONOSCOPY WITH PROPOFOL N/A 07/04/2016   Procedure: COLONOSCOPY WITH PROPOFOL;  Surgeon: Lollie Sails, MD;  Location: [facility] ENDOSCOPY;  Service: Endoscopy;  Laterality: N/A;  . ESOPHAGOGASTRODUODENOSCOPY (EGD) WITH PROPOFOL N/A 07/04/2016   Procedure: ESOPHAGOGASTRODUODENOSCOPY (EGD) WITH PROPOFOL;  Surgeon: Lollie Sails, MD;  Location: [facility] ENDOSCOPY;  Service: Endoscopy;  Laterality: N/A;    Family History  Problem Relation Age of Onset  . Alzheimer's disease Father   . Congestive Heart Failure Mother   . Diabetes Mother   . Multiple sclerosis Brother   . Lupus Grandchild   . Stroke Grandchild   . Breast cancer Neg Hx     Social History:  reports that she has never smoked. Her smokeless tobacco use includes snuff. She reports that she does not drink alcohol or use drugs. She lives in [place].  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  She retired at age 71.  She worked for [employer].  She denies any exposure to radiation or toxins.  She is concerned about finances and paying for new medications.  She is on a fixed income.The patient is alone today.  Participants in the patient's visit and their role in the encounter included the patient, and Waymon Budge, RN  today.  The intake visit was provided by Waymon Budge, RN.   Allergies:  Allergies  Allergen Reactions  . Amoxicillin Other (See Comments)  . Lipitor [Atorvastatin] Other (See Comments)    Weakness in legs Weakness in legs  . Penicillins Other (See Comments)    Pt states medication upsets her stomach    Current Medications: Current Outpatient Medications  Medication Sig Dispense Refill  . acetaminophen (TYLENOL) 500 MG tablet Take 500 mg by mouth every 8 (eight) hours as needed for moderate pain.     Marland Kitchen aspirin EC 81 MG tablet Take 81 mg by mouth daily.    . Calcium Carbonate-Vitamin D 600-400 MG-UNIT tablet Take 2 tablets by mouth daily.    . Cholecalciferol (VITAMIN D3) 5000 UNITS CAPS Take 2,000 Units by mouth daily.     . hydroxyurea (HYDREA) 500 MG capsule TAKE 2 CAPS MON, WED, FRI, SAT AND TAKE 1 CAPS (500 MG) ON TUE, THURS, SUN. 132 capsule 0  . pantoprazole (PROTONIX) 40 MG tablet Take 40 mg by mouth daily.    . pravastatin (PRAVACHOL) 80 MG tablet Take by mouth.    . vitamin B-12 (CYANOCOBALAMIN) 1000 MCG tablet Take 1 tablet (1,000 mcg total) by mouth daily. 30 tablet 0  . ondansetron (ZOFRAN) 8 MG tablet Take 8 mg by mouth every 8 (eight) hours as needed.      No current facility-administered medications for this visit.      Review of Systems  Constitutional: Positive for diaphoresis. Negative for chills, fever, malaise/fatigue and weight loss (up 5 pounds).       "I feel good."  HENT: Negative.  Negative for congestion, ear pain, nosebleeds, sinus pain, sore throat and tinnitus.   Eyes: Negative.  Negative for double vision, photophobia, pain, discharge and redness.  Respiratory: Positive for shortness of breath (exertional). Negative for cough, hemoptysis and sputum production.   Cardiovascular: Negative.  Negative for chest pain, palpitations, orthopnea, leg swelling and PND.  Gastrointestinal: Negative.  Negative for abdominal pain, blood in stool,  constipation, diarrhea, heartburn, melena, nausea and vomiting.  Genitourinary: Negative.  Negative for dysuria, frequency, hematuria and urgency.  Musculoskeletal: Positive for back pain (after sleeping) and joint pain (LEFT hips and BILATERAL knees). Negative for falls, myalgias and neck pain.       Scoliosis.  Skin: Negative.  Negative for itching and rash.  Neurological: Negative.  Negative for dizziness, tingling, tremors, sensory change, speech change, focal weakness, weakness and headaches.  Endo/Heme/Allergies: Does not bruise/bleed easily.  Psychiatric/Behavioral: Negative for depression and memory loss. The patient is not nervous/anxious and does not have insomnia.   All other systems reviewed and are negative.   Performance status (ECOG): 1  Physical Exam  Constitutional: She is oriented to person, place, and time. She appears well-developed and well-nourished. No distress.  HENT:  Head: Normocephalic and atraumatic.  Gray hair pulled back.  Eyes: Conjunctivae and EOM are normal. No scleral icterus.  Glasses.  Neurological: She is alert and oriented to person, place, and time.  Skin: She is not diaphoretic.  Psychiatric: She has a normal mood and affect.  Her behavior is normal. Judgment and thought content normal.  Nursing note reviewed.   No visits with results within 3 Day(s) from this visit.  Latest known visit with results is:  Appointment on 04/20/2019  Component Date Value Ref Range Status  . Sodium 04/20/2019 141  135 - 145 mmol/L Final  . Potassium 04/20/2019 3.5  3.5 - 5.1 mmol/L Final  . Chloride 04/20/2019 109  98 - 111 mmol/L Final  . CO2 04/20/2019 25  22 - 32 mmol/L Final  . Glucose, Bld 04/20/2019 82  70 - 99 mg/dL Final  . BUN 04/20/2019 11  8 - 23 mg/dL Final  . Creatinine, Ser 04/20/2019 0.88  0.44 - 1.00 mg/dL Final  . Calcium 04/20/2019 8.8* 8.9 - 10.3 mg/dL Final  . Total Protein 04/20/2019 7.4  6.5 - 8.1 g/dL Final  . Albumin 04/20/2019 3.4* 3.5  - 5.0 g/dL Final  . AST 04/20/2019 22  15 - 41 U/L Final  . ALT 04/20/2019 19  0 - 44 U/L Final  . Alkaline Phosphatase 04/20/2019 41  38 - 126 U/L Final  . Total Bilirubin 04/20/2019 0.3  0.3 - 1.2 mg/dL Final  . GFR calc non Af Amer 04/20/2019 >60  >60 mL/min Final  . GFR calc Af Amer 04/20/2019 >60  >60 mL/min Final  . Anion gap 04/20/2019 7  5 - 15 Final   Performed at [facility], [address], [place], [place] [address]  . WBC 04/20/2019 7.6  4.0 - 10.5 K/uL Final  . RBC 04/20/2019 3.65* 3.87 - 5.11 MIL/uL Final  . Hemoglobin 04/20/2019 12.9  12.0 - 15.0 g/dL Final  . HCT 04/20/2019 38.5  36.0 - 46.0 % Final  . MCV 04/20/2019 105.5* 80.0 - 100.0 fL Final  . MCH 04/20/2019 35.3* 26.0 - 34.0 pg Final  . MCHC 04/20/2019 33.5  30.0 - 36.0 g/dL Final  . RDW 04/20/2019 12.8  11.5 - 15.5 % Final  . Platelets 04/20/2019 348  150 - 400 K/uL Final  . nRBC 04/20/2019 0.0  0.0 - 0.2 % Final  . Neutrophils Relative % 04/20/2019 52  % Final  . Neutro Abs 04/20/2019 3.9  1.7 - 7.7 K/uL Final  . Lymphocytes Relative 04/20/2019 35  % Final  . Lymphs Abs 04/20/2019 2.7  0.7 - 4.0 K/uL Final  . Monocytes Relative 04/20/2019 12  % Final  . Monocytes Absolute 04/20/2019 0.9  0.1 - 1.0 K/uL Final  . Eosinophils Relative 04/20/2019 1  % Final  . Eosinophils Absolute 04/20/2019 0.1  0.0 - 0.5 K/uL Final  . Basophils Relative 04/20/2019 0  % Final  . Basophils Absolute 04/20/2019 0.0  0.0 - 0.1 K/uL Final  . Immature Granulocytes 04/20/2019 0  % Final  . Abs Immature Granulocytes 04/20/2019 0.03  0.00 - 0.07 K/uL Final   Performed at [facility], [address], [place], [place] [address]    Assessment:  Molly Perez is a 73 y.o. 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  She has a history of mild leukocytosis, thromobocytosis, and erythrocytosis.   Platelet count has ranged between 512,000 - 622,000 since 03/24/2016.  WBC has been  mildly elevated (10,400 - 10,600).  Hematocrit was normal until 09/18/2016.  At that time, hematocrit was 50.7 with a hemoglobin of 16.6.  She is on a baby aspirin.  Work-up on 11/05/2016 revealed a hematocrit of 43.7, hemoglobin 14.3, platelets 583,00, WBC 9800 with an ANC  of 6600.  Normal studies included:  Ferritin, iron studies, and erythropoietin level.  JAK2 was positive for V617F.  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  Marrow was normocellular to mildy hypercellular for age (40-50%) with trilineage hematopoiesis including a proliferation of atypical megakaryocytes with focal clusters and no increase in blasts.  There was no significant increase in marrow reticulin fibers.  Storage iron was present.  Cytogenetics were normal (57, XX).  Flow cytometry was negative.  FISH studies for MPN/CML were negative.  She is on hydroxyurea (began 12/21/2016).  She is taking 1 tablet 4 days/week and 2 tablets 3 days a week (total 11 tablets/week).  She denies any side effects.  Left lower extremity duplex on 01/26/2017 revealed no evidence of a DVT.  EGD on 07/04/2016 revealed reflux esophagitis, H pylori gastritis, and a small hiatal hernia.  H pylori was treated with metronidazole, clarithromycin, and omeprazole. Colonoscopy on 07/04/2016 was normal.  Bone density on 04/07/2016 revealed osteopenia with a T-score of -2.1 in the AP spine L2-L4.  Bone density on 04/20/2019 revealed osteopenia with a T-score of -2.0 in the AP spine L1-L3.  Symptomatically, she feels good.  She denies any B symptoms.  Hemoglobin is 12.9.  WBC 7600 with an [place] of 3900.  Plan: 1.   Review labs from 04/20/2019. 2.   Essential thrombocytosis Platelets 348,000. Platelet goal less than 400,000. Clinically she is doing well. Continue hydroxyurea 1000 mg 4 days/week and 500 mg 3 days/week. Continue baby aspirin. 3.    Osteopenia  Bone density in 04/20/2019 revealed a T score of -2.0 in the AP spine.  Encourage calcium and vitamin D. 4.   RTC in 3 months for MD assessment (in person order oximetry) and labs (CBC with diff, CMP).  I discussed the assessment and treatment plan with the patient.  The patient was provided an opportunity to ask questions and all were answered.  The patient agreed with the plan and demonstrated an understanding of the instructions.  The patient was advised to call back or seek an in person evaluation if the symptoms worsen or if the condition fails to improve as anticipated.   Nolon Stalls, MD, PhD  04/26/2019, 11:32 AM   I, Selena Batten, am acting as scribe for [employer]. Mike Gip, MD, PhD.  I, Melissa C. Mike Gip, MD, have reviewed the above documentation for accuracy and completeness, and I agree with the above.

## 2019-04-26 ENCOUNTER — Encounter: Payer: Self-pay | Admitting: Hematology and Oncology

## 2019-04-26 ENCOUNTER — Other Ambulatory Visit: Payer: Medicare HMO

## 2019-04-26 ENCOUNTER — Telehealth: Payer: Self-pay

## 2019-04-26 ENCOUNTER — Inpatient Hospital Stay (HOSPITAL_BASED_OUTPATIENT_CLINIC_OR_DEPARTMENT_OTHER): Payer: Medicare HMO | Admitting: Hematology and Oncology

## 2019-04-26 DIAGNOSIS — D473 Essential (hemorrhagic) thrombocythemia: Secondary | ICD-10-CM | POA: Diagnosis not present

## 2019-04-26 DIAGNOSIS — M8588 Other specified disorders of bone density and structure, other site: Secondary | ICD-10-CM | POA: Diagnosis not present

## 2019-04-26 DIAGNOSIS — Z79899 Other long term (current) drug therapy: Secondary | ICD-10-CM | POA: Diagnosis not present

## 2019-04-26 NOTE — Telephone Encounter (Signed)
Contains abnormal data CBC with Differential/Platelet Order: [id number] Status:  Final result Visible to patient:  No (not released) Next appt:  Today at 11:30 AM in Oncology (Lequita Asal, MD) Dx:  Osteopenia of multiple sites  Ref Range & Units 6d ago 70mo ago 33mo ago  WBC 4.0 - 10.5 K/uL 7.6  5.9  6.9   RBC 3.87 - 5.11 MIL/uL 3.65Low   3.76Low   3.79Low    Hemoglobin 12.0 - 15.0 g/dL 12.9  13.2  13.1   HCT 36.0 - 46.0 % 38.5  40.3  40.3   MCV 80.0 - 100.0 fL 105.5High   107.2High   106.3High    MCH 26.0 - 34.0 pg 35.3High   35.1High   34.6High    MCHC 30.0 - 36.0 g/dL 33.5  32.8  32.5   RDW 11.5 - 15.5 % 12.8  13.3  13.5   Platelets 150 - 400 K/uL 348  357  364   nRBC 0.0 - 0.2 % 0.0  0.0  0.0   Neutrophils Relative % % 52  50  51   Neutro Abs 1.7 - 7.7 K/uL 3.9  2.9  3.6   Lymphocytes Relative % 35  40  37   Lymphs Abs 0.7 - 4.0 K/uL 2.7  2.4  2.5   Monocytes Relative % 12  8  10    Monocytes Absolute 0.1 - 1.0 K/uL 0.9  0.5  0.7   Eosinophils Relative % 1  1  1    Eosinophils Absolute 0.0 - 0.5 K/uL 0.1  0.1  0.1   Basophils Relative % 0  1  1   Basophils Absolute 0.0 - 0.1 K/uL 0.0  0.0  0.1   Immature Granulocytes % 0  0  0   Abs Immature Granulocytes 0.00 - 0.07 K/uL 0.03  0.01 CM  0.02 CM   Comment: Performed at [facility], [address] [place], [place] [address]  Resulting Agency  [facility] CLIN LAB [facility] CLIN LAB [facility] CLIN LAB      Specimen Collected: 04/20/19 15:45 Last Resulted: 04/20/19 15:55     Lab Flowsheet   Order Details   View Encounter   Lab and Collection Details   Routing   Result History     CM=Additional comments      Other Results from 04/20/2019  Contains abnormal data Comprehensive metabolic panel Order: [id number]  Status:  Final result Visible to patient:  No (not released) Next appt:  Today at 11:30 AM in Oncology (Lequita Asal, MD) Dx:  Osteopenia of multiple sites  Ref Range & Units 6d ago 87mo ago 87mo ago   Sodium 135 - 145 mmol/L 141  138  138   Potassium 3.5 - 5.1 mmol/L 3.5  3.4Low   3.6   Chloride 98 - 111 mmol/L 109  106  104   CO2 22 - 32 mmol/L 25  25  26    Glucose, Bld 70 - 99 mg/dL 82  123High   95   BUN 8 - 23 mg/dL 11  11  10    Creatinine, Ser 0.44 - 1.00 mg/dL 0.88  0.87  0.89   Calcium 8.9 - 10.3 mg/dL 8.8Low   8.6Low   8.5Low    Total Protein 6.5 - 8.1 g/dL 7.4  7.2  7.8   Albumin 3.5 - 5.0 g/dL 3.4Low   3.2Low   3.5   AST 15 - 41 U/L 22  20  19    ALT 0 -  44 U/L 19  15  16    Alkaline Phosphatase 38 - 126 U/L 41  41  38   Total Bilirubin 0.3 - 1.2 mg/dL 0.3  0.4  0.6   GFR calc non Af Amer >60 mL/min >60  >60  >60   GFR calc Af Amer >60 mL/min >60  >60  >60   Anion gap 5 - 15 7  7  CM  8 CM   Comment: Performed at [facility], [address], [place], [place] [address]  Resulting Agency  [facility] CLIN LAB [facility] CLIN LAB [facility] CLIN LAB      Specimen Collected: 04/20/19 15:45 Last Resulted: 04/20/19 16:08

## 2019-04-26 NOTE — Progress Notes (Signed)
Confirmed Name, DOB, and Address. Denies any concerns.  

## 2019-04-26 NOTE — Telephone Encounter (Signed)
Refill medication Hydrea 500 mg 2 cap Mon, Wed, Fri, Sat /  1 cap Tues, Thur, Sun 500 mg # 132 with no refill. Patient has been informed.

## 2019-06-22 ENCOUNTER — Other Ambulatory Visit: Payer: Self-pay | Admitting: Pediatrics

## 2019-06-22 DIAGNOSIS — Z1231 Encounter for screening mammogram for malignant neoplasm of breast: Secondary | ICD-10-CM

## 2019-07-15 ENCOUNTER — Other Ambulatory Visit: Payer: Self-pay | Admitting: Hematology and Oncology

## 2019-07-15 DIAGNOSIS — D473 Essential (hemorrhagic) thrombocythemia: Secondary | ICD-10-CM

## 2019-07-18 ENCOUNTER — Telehealth: Payer: Self-pay

## 2019-07-18 NOTE — Telephone Encounter (Signed)
Refill Hydrea 500 mg 2 cap on Mondayy,Wed,Fri,Sat and take 1 cap ( 500 mg) on Tues Thurs and Sunday # 132 with no refills. The patient was made aware

## 2019-07-25 NOTE — Progress Notes (Signed)
No new changes noted. The patient Name and DOB has been verified. 

## 2019-07-26 NOTE — Progress Notes (Signed)
[facility]  [address], [address] [place], [place] [address] Phone: [phone]  Fax: [phone]   Clinic Day:  07/27/2019  Referring physician: Barbaraann Boys, MD  Chief Complaint: Molly Perez is a 73 y.o. 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 At that time, she felt good.  She denied any B symptoms.  Hemoglobin was 12.9.  WBC 7600 with an [place] of 3900.  During the interim, she has been doing well.  She denies any concerns.  She continues to have shortness of breath on exertion.  She states that she sweats all the time especially when working around the house.  She has some right knee arthritis.  She continues hydroxyurea 1000 mg 4 days/week and 500 mg 3 days/week.   Past Medical History:  Diagnosis Date  . Cataract cortical, senile   . GERD (gastroesophageal reflux disease)   . Hypercholesterolemia   . Obesity   . Pre-diabetes   . Prediabetes   . Vitamin D deficiency     Past Surgical History:  Procedure Laterality Date  . ABDOMINAL HYSTERECTOMY    . COLONOSCOPY    . COLONOSCOPY WITH PROPOFOL N/A 07/04/2016   Procedure: COLONOSCOPY WITH PROPOFOL;  Surgeon: Lollie Sails, MD;  Location: [facility] ENDOSCOPY;  Service: Endoscopy;  Laterality: N/A;  . ESOPHAGOGASTRODUODENOSCOPY (EGD) WITH PROPOFOL N/A 07/04/2016   Procedure: ESOPHAGOGASTRODUODENOSCOPY (EGD) WITH PROPOFOL;  Surgeon: Lollie Sails, MD;  Location: [facility] ENDOSCOPY;  Service: Endoscopy;  Laterality: N/A;    Family History  Problem Relation Age of Onset  . Alzheimer's disease Father   . Congestive Heart Failure Mother   . Diabetes Mother   . Multiple sclerosis Brother   . Lupus Grandchild   . Stroke Grandchild   . Breast cancer Neg Hx     Social History:  reports that she has never smoked. Her smokeless tobacco use includes snuff. She reports that she  does not drink alcohol or use drugs. She lives in [place]. 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 She retired at age 57. She worked for [employer]. She denies any exposure to radiation or toxins. She is concerned about finances and paying for new medications. She is on a fixed income. The patient is alone today.  Allergies:  Allergies  Allergen Reactions  . Amoxicillin Other (See Comments)  . Lipitor [Atorvastatin] Other (See Comments)    Weakness in legs Weakness in legs  . Penicillins Other (See Comments)    Pt states medication upsets her stomach    Current Medications: Current Outpatient Medications  Medication Sig Dispense Refill  . aspirin EC 81 MG tablet Take 81 mg by mouth daily.    . Calcium Carbonate-Vitamin D 600-400 MG-UNIT tablet Take 2 tablets by mouth daily.    . Cholecalciferol (VITAMIN D3) 5000 UNITS CAPS Take 2,000 Units by mouth daily.     . hydroxyurea (HYDREA) 500 MG capsule TAKE 2 CAPS MON, WED, FRI, SAT AND TAKE 1 CAPS (500 MG) ON TUE, THURS, SUN. 132 capsule 0  . pantoprazole (PROTONIX) 40 MG tablet Take 40 mg by mouth daily.    . pravastatin (PRAVACHOL) 80 MG tablet Take by mouth.    . vitamin B-12 (CYANOCOBALAMIN) 1000 MCG tablet Take 1 tablet (1,000 mcg total) by mouth daily. 30 tablet 0  . acetaminophen (TYLENOL) 500 MG tablet Take 500  mg by mouth every 8 (eight) hours as needed for moderate pain.     Marland Kitchen ondansetron (ZOFRAN) 8 MG tablet Take 8 mg by mouth every 8 (eight) hours as needed.      No current facility-administered medications for this visit.     Review of Systems  Constitutional: Positive for diaphoresis and weight loss (6 lbs; down 1lb from 10/2018). Negative for chills, fever and malaise/fatigue.       "I feel good."  No concerns.  HENT: Negative.  Negative for congestion, ear pain, nosebleeds, sinus pain, sore throat and tinnitus.   Eyes: Negative.  Negative for blurred vision, double vision,  photophobia, pain, discharge and redness.  Respiratory: Positive for shortness of breath (exertional). Negative for cough, hemoptysis and sputum production.   Cardiovascular: Negative.  Negative for chest pain, palpitations, orthopnea, leg swelling and PND.  Gastrointestinal: Negative.  Negative for abdominal pain, blood in stool, constipation, diarrhea, heartburn, melena, nausea and vomiting.  Genitourinary: Negative.  Negative for dysuria, frequency, hematuria and urgency.  Musculoskeletal: Positive for joint pain (LEFT hips and BILATERAL knees). Negative for back pain, falls, myalgias and neck pain.       Scoliosis.  Skin: Negative.  Negative for itching and rash.  Neurological: Negative.  Negative for dizziness, tingling, tremors, sensory change, speech change, focal weakness, weakness and headaches.  Endo/Heme/Allergies: Does not bruise/bleed easily.  Psychiatric/Behavioral: Negative for depression and memory loss. The patient is not nervous/anxious and does not have insomnia.   All other systems reviewed and are negative.  Performance status (ECOG):  1  Vitals Blood pressure (!) 147/72, pulse 66, temperature 98 F (36.7 C), temperature source Oral, resp. rate 20, weight 213 lb 15.3 oz (97 kg), SpO2 100 %.   Physical Exam  Constitutional: She is oriented to person, place, and time. She appears well-developed and well-nourished. No distress.  HENT:  Head: Normocephalic and atraumatic.  Gray hair pulled back.  Mask.  Eyes: Pupils are equal, round, and reactive to light. Conjunctivae and EOM are normal. No scleral icterus.  Glasses.  Brown eyes.  Neck: Normal range of motion. Neck supple. No JVD present.  Cardiovascular: Normal rate and regular rhythm. Exam reveals no gallop.  No murmur heard. Pulmonary/Chest: Effort normal and breath sounds normal. She has no wheezes. She has no rales. She exhibits no tenderness.  Abdominal: Soft. Bowel sounds are normal. She exhibits no mass. There  is no abdominal tenderness. There is no rebound and no guarding.  Musculoskeletal: Normal range of motion.        General: No tenderness or edema.  Lymphadenopathy:    She has no cervical adenopathy.  Neurological: She is alert and oriented to person, place, and time.  Skin: Skin is warm. No rash noted. She is not diaphoretic. No erythema. No pallor.  Psychiatric: She has a normal mood and affect. Her behavior is normal. Judgment and thought content normal.  Nursing note reviewed.   Appointment on 07/27/2019  Component Date Value Ref Range Status  . Sodium 07/27/2019 135  135 - 145 mmol/L Final  . Potassium 07/27/2019 3.5  3.5 - 5.1 mmol/L Final  . Chloride 07/27/2019 102  98 - 111 mmol/L Final  . CO2 07/27/2019 25  22 - 32 mmol/L Final  . Glucose, Bld 07/27/2019 90  70 - 99 mg/dL Final  . BUN 07/27/2019 10  8 - 23 mg/dL Final  . Creatinine, Ser 07/27/2019 0.96  0.44 - 1.00 mg/dL Final  . Calcium 07/27/2019 8.8* 8.9 -  10.3 mg/dL Final  . Total Protein 07/27/2019 7.8  6.5 - 8.1 g/dL Final  . Albumin 07/27/2019 3.5  3.5 - 5.0 g/dL Final  . AST 07/27/2019 18  15 - 41 U/L Final  . ALT 07/27/2019 14  0 - 44 U/L Final  . Alkaline Phosphatase 07/27/2019 40  38 - 126 U/L Final  . Total Bilirubin 07/27/2019 0.5  0.3 - 1.2 mg/dL Final  . GFR calc non Af Amer 07/27/2019 58* >60 mL/min Final  . GFR calc Af Amer 07/27/2019 >60  >60 mL/min Final  . Anion gap 07/27/2019 8  5 - 15 Final   Performed at [facility], [address], [place], [place] [address]  . WBC 07/27/2019 6.7  4.0 - 10.5 K/uL Final  . RBC 07/27/2019 3.87  3.87 - 5.11 MIL/uL Final  . Hemoglobin 07/27/2019 13.0  12.0 - 15.0 g/dL Final  . HCT 07/27/2019 40.3  36.0 - 46.0 % Final  . MCV 07/27/2019 104.1* 80.0 - 100.0 fL Final  . MCH 07/27/2019 33.6  26.0 - 34.0 pg Final  . MCHC 07/27/2019 32.3  30.0 - 36.0 g/dL Final  . RDW 07/27/2019 13.5  11.5 - 15.5 % Final  . Platelets 07/27/2019 321  150 - 400 K/uL Final  .  nRBC 07/27/2019 0.0  0.0 - 0.2 % Final  . Neutrophils Relative % 07/27/2019 46  % Final  . Neutro Abs 07/27/2019 3.1  1.7 - 7.7 K/uL Final  . Lymphocytes Relative 07/27/2019 42  % Final  . Lymphs Abs 07/27/2019 2.8  0.7 - 4.0 K/uL Final  . Monocytes Relative 07/27/2019 10  % Final  . Monocytes Absolute 07/27/2019 0.7  0.1 - 1.0 K/uL Final  . Eosinophils Relative 07/27/2019 1  % Final  . Eosinophils Absolute 07/27/2019 0.0  0.0 - 0.5 K/uL Final  . Basophils Relative 07/27/2019 1  % Final  . Basophils Absolute 07/27/2019 0.0  0.0 - 0.1 K/uL Final  . Immature Granulocytes 07/27/2019 0  % Final  . Abs Immature Granulocytes 07/27/2019 0.01  0.00 - 0.07 K/uL Final   Performed at [facility], [address] [place], [place] [address]    Assessment:  Molly Perez is a 73 y.o. 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 She has a history of mild leukocytosis, thromobocytosis, and erythrocytosis. Platelet count has ranged between 512,000 - 622,000 since 03/24/2016. WBC has been mildly elevated (10,400 - 10,600). Hematocrit was normal until 09/18/2016. At that time, hematocrit was 50.7 with a hemoglobin of 16.6. She is on ababy aspirin.  Work-up on 11/05/2016 revealed a hematocrit of 43.7, hemoglobin 14.3, platelets 583,00, WBC 9800 with an ANC of 6600. Normal studies included: Ferritin, iron studies, and erythropoietin level. JAK2waspositive for V617F.  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 Marrow was normocellular to mildy hypercellular for age (40-50%) with trilineage hematopoiesis including a proliferation of atypical megakaryocytes with focal clusters and no increase in blasts. There was no significant increase in marrow reticulin fibers. Storage iron was present. Cytogenetics were normal (79, XX). Flow cytometry was negative. FISH  studies for MPN/CML were negative.  She is onhydroxyurea (began 12/21/2016). She is taking 1 tablet 4 days/week and 2 tablets 3 days a week (total 11 tablets/week). She denies any side effects.  Left lower extremity duplex on 01/26/2017 revealed no evidence of a DVT.  EGDon 07/04/2016 revealed reflux esophagitis, H pylori gastritis, and a  small hiatal hernia. H pylori was treated with metronidazole, clarithromycin, and omeprazole. Colonoscopyon 07/04/2016 was normal.  Bone density on 04/07/2016 revealed osteopenia with a T-score of -2.1 in the AP spine L2-L4.  Bone density on 04/20/2019 revealed osteopenia with a T-score of -2.0 in the AP spine L1-L3.  Symptomatically, she is doing well.  Exam is stable.  Plan: 1.   Labs today:  CBC with diff, CMP. 2.   Essential thrombocytosis Clinically, she is doing well.   Hematocrit 40.3.  Hemoglobin 13.0.  MCV 104.1.  Platelets 321,000.  WBC 6700. Platelet goal <= 400,000. Continue hydroxyurea 1000 mg 4 days/week and 500 mg 3 days/week. Continue baby aspirin. 3.   Osteopenia             Bone density in 04/2019 revealed a T score of -2.0 in the AP spine.             Continue calcium and vitamin D. 4.   RTC in 3 months for MD assessment and labs (CBC with diff, CMP).  I discussed the assessment and treatment plan with the patient.  The patient was provided an opportunity to ask questions and all were answered.  The patient agreed with the plan and demonstrated an understanding of the instructions.  The patient was advised to call back if the symptoms worsen or if the condition fails to improve as anticipated.   Lequita Asal, MD, PhD    07/27/2019, 12:06 PM  I, Jacqualyn Posey, am acting as Education administrator for [employer]. Mike Gip, MD, PhD.  I, Melissa C. Mike Gip, MD, have reviewed the above documentation for accuracy and completeness, and I agree with the above.

## 2019-07-27 ENCOUNTER — Other Ambulatory Visit: Payer: Self-pay

## 2019-07-27 ENCOUNTER — Inpatient Hospital Stay: Payer: Medicare HMO | Attending: Hematology and Oncology | Admitting: Hematology and Oncology

## 2019-07-27 ENCOUNTER — Inpatient Hospital Stay: Payer: Medicare HMO

## 2019-07-27 ENCOUNTER — Encounter: Payer: Self-pay | Admitting: Hematology and Oncology

## 2019-07-27 VITALS — BP 147/72 | HR 66 | Temp 98.0°F | Resp 20 | Wt 214.0 lb

## 2019-07-27 DIAGNOSIS — M1711 Unilateral primary osteoarthritis, right knee: Secondary | ICD-10-CM | POA: Diagnosis not present

## 2019-07-27 DIAGNOSIS — Z8269 Family history of other diseases of the musculoskeletal system and connective tissue: Secondary | ICD-10-CM | POA: Insufficient documentation

## 2019-07-27 DIAGNOSIS — K449 Diaphragmatic hernia without obstruction or gangrene: Secondary | ICD-10-CM | POA: Insufficient documentation

## 2019-07-27 DIAGNOSIS — D473 Essential (hemorrhagic) thrombocythemia: Secondary | ICD-10-CM | POA: Insufficient documentation

## 2019-07-27 DIAGNOSIS — Z832 Family history of diseases of the blood and blood-forming organs and certain disorders involving the immune mechanism: Secondary | ICD-10-CM | POA: Diagnosis not present

## 2019-07-27 DIAGNOSIS — Z820 Family history of epilepsy and other diseases of the nervous system: Secondary | ICD-10-CM | POA: Diagnosis not present

## 2019-07-27 DIAGNOSIS — R61 Generalized hyperhidrosis: Secondary | ICD-10-CM | POA: Diagnosis not present

## 2019-07-27 DIAGNOSIS — F17290 Nicotine dependence, other tobacco product, uncomplicated: Secondary | ICD-10-CM | POA: Insufficient documentation

## 2019-07-27 DIAGNOSIS — M8588 Other specified disorders of bone density and structure, other site: Secondary | ICD-10-CM

## 2019-07-27 DIAGNOSIS — Z880 Allergy status to penicillin: Secondary | ICD-10-CM | POA: Diagnosis not present

## 2019-07-27 DIAGNOSIS — M2550 Pain in unspecified joint: Secondary | ICD-10-CM | POA: Insufficient documentation

## 2019-07-27 DIAGNOSIS — R0602 Shortness of breath: Secondary | ICD-10-CM | POA: Diagnosis not present

## 2019-07-27 DIAGNOSIS — Z79899 Other long term (current) drug therapy: Secondary | ICD-10-CM | POA: Diagnosis not present

## 2019-07-27 DIAGNOSIS — Z8249 Family history of ischemic heart disease and other diseases of the circulatory system: Secondary | ICD-10-CM | POA: Diagnosis not present

## 2019-07-27 DIAGNOSIS — M8580 Other specified disorders of bone density and structure, unspecified site: Secondary | ICD-10-CM | POA: Diagnosis not present

## 2019-07-27 DIAGNOSIS — Z8719 Personal history of other diseases of the digestive system: Secondary | ICD-10-CM | POA: Insufficient documentation

## 2019-07-27 LAB — CBC WITH DIFFERENTIAL/PLATELET
Abs Immature Granulocytes: 0.01 10*3/uL (ref 0.00–0.07)
Basophils Absolute: 0 10*3/uL (ref 0.0–0.1)
Basophils Relative: 1 %
Eosinophils Absolute: 0 10*3/uL (ref 0.0–0.5)
Eosinophils Relative: 1 %
HCT: 40.3 % (ref 36.0–46.0)
Hemoglobin: 13 g/dL (ref 12.0–15.0)
Immature Granulocytes: 0 %
Lymphocytes Relative: 42 %
Lymphs Abs: 2.8 10*3/uL (ref 0.7–4.0)
MCH: 33.6 pg (ref 26.0–34.0)
MCHC: 32.3 g/dL (ref 30.0–36.0)
MCV: 104.1 fL — ABNORMAL HIGH (ref 80.0–100.0)
Monocytes Absolute: 0.7 10*3/uL (ref 0.1–1.0)
Monocytes Relative: 10 %
Neutro Abs: 3.1 10*3/uL (ref 1.7–7.7)
Neutrophils Relative %: 46 %
Platelets: 321 10*3/uL (ref 150–400)
RBC: 3.87 MIL/uL (ref 3.87–5.11)
RDW: 13.5 % (ref 11.5–15.5)
WBC: 6.7 10*3/uL (ref 4.0–10.5)
nRBC: 0 % (ref 0.0–0.2)

## 2019-07-27 LAB — COMPREHENSIVE METABOLIC PANEL
ALT: 14 U/L (ref 0–44)
AST: 18 U/L (ref 15–41)
Albumin: 3.5 g/dL (ref 3.5–5.0)
Alkaline Phosphatase: 40 U/L (ref 38–126)
Anion gap: 8 (ref 5–15)
BUN: 10 mg/dL (ref 8–23)
CO2: 25 mmol/L (ref 22–32)
Calcium: 8.8 mg/dL — ABNORMAL LOW (ref 8.9–10.3)
Chloride: 102 mmol/L (ref 98–111)
Creatinine, Ser: 0.96 mg/dL (ref 0.44–1.00)
GFR calc Af Amer: >60 mL/min (ref >60)
GFR calc non Af Amer: 58 mL/min — ABNORMAL LOW (ref >60)
Glucose, Bld: 90 mg/dL (ref 70–99)
Potassium: 3.5 mmol/L (ref 3.5–5.1)
Sodium: 135 mmol/L (ref 135–145)
Total Bilirubin: 0.5 mg/dL (ref 0.3–1.2)
Total Protein: 7.8 g/dL (ref 6.5–8.1)

## 2019-07-27 NOTE — Progress Notes (Signed)
Patient here for follow up. Denies any concerns.  

## 2019-08-01 ENCOUNTER — Ambulatory Visit
Admission: RE | Admit: 2019-08-01 | Discharge: 2019-08-01 | Disposition: A | Discharge status: Home / Self Care | Payer: Medicare HMO | Source: Ambulatory Visit | Attending: Pediatrics | Admitting: Pediatrics

## 2019-08-01 ENCOUNTER — Other Ambulatory Visit: Payer: Self-pay

## 2019-08-01 DIAGNOSIS — Z1231 Encounter for screening mammogram for malignant neoplasm of breast: Secondary | ICD-10-CM

## 2019-08-04 ENCOUNTER — Other Ambulatory Visit: Payer: Self-pay | Admitting: Pediatrics

## 2019-08-04 DIAGNOSIS — R928 Other abnormal and inconclusive findings on diagnostic imaging of breast: Secondary | ICD-10-CM

## 2019-08-12 ENCOUNTER — Ambulatory Visit
Admission: RE | Admit: 2019-08-12 | Discharge: 2019-08-12 | Disposition: A | Discharge status: Home / Self Care | Payer: Medicare HMO | Source: Ambulatory Visit | Attending: Pediatrics | Admitting: Pediatrics

## 2019-08-12 DIAGNOSIS — R928 Other abnormal and inconclusive findings on diagnostic imaging of breast: Secondary | ICD-10-CM

## 2019-09-30 ENCOUNTER — Other Ambulatory Visit: Payer: Self-pay | Admitting: Hematology and Oncology

## 2019-09-30 DIAGNOSIS — D473 Essential (hemorrhagic) thrombocythemia: Secondary | ICD-10-CM

## 2019-10-24 NOTE — Progress Notes (Signed)
[facility]  [address], [address] [place], [place] [address] Phone: [phone]  Fax: [phone]   Clinic Day:  10/26/2019  Referring physician: Barbaraann Boys, MD  Chief Complaint: Molly Perez is a 73 y.o. 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  HPI: The patient was last seen in the hematology clinic on 07/27/2019. At that time, she was doing well.  Exam was stable. Hematocrit 40.3, hemoglobin 13.0, MCV 104.1, platelets 321,000 (goal <= 400,000), WBC 6,700. She continued hydroxyurea 1,000 mg 4 days/week and 500 mg 3 days/week. She continued on calcium and vitamin D.   Screening bilateral mammogram on 08/01/2019 revealed possible distortion in the right breast.  A diagnostic mammogram and possibly ultrasound of the right breast was recommended.   Diagnostic right breast mammogram on 08/12/2019 revealed no evidence of malignancy. The recently suspected right breast distortion was felt to be close apposition of normal breast tissue. Bilateral screening mammogram was recommended in 1 year.  During the interim, she felt "good". Her BP is 155/77 in the clinic today. She continues to take hydroxyurea 1,000 mg 4 days/week and 500 mg 3 days/week. She notes occasional bruising. She denies any other issues with the medication. She is taking her calcium as directed.   She continues to have shortness of breath on exertion. She has bilateral knee pain. Her right knee hurts more secondary to pulling a ligament. Her left hip pain has improved. She now has right hip pain and can only sleep on her left side.    Past Medical History:  Diagnosis Date  . Cataract cortical, senile   . GERD (gastroesophageal reflux disease)   . Hypercholesterolemia   . Obesity   . Pre-diabetes   . Prediabetes   . Vitamin D deficiency     Past Surgical History:  Procedure Laterality Date  . ABDOMINAL HYSTERECTOMY    . COLONOSCOPY     . COLONOSCOPY WITH PROPOFOL N/A 07/04/2016   Procedure: COLONOSCOPY WITH PROPOFOL;  Surgeon: Lollie Sails, MD;  Location[facility] ENDOSCOPY;  Service: Endoscopy;  Laterality: N/A;  . ESOPHAGOGASTRODUODENOSCOPY (EGD) WITH PROPOFOL N/A 07/04/2016   Procedure: ESOPHAGOGASTRODUODENOSCOPY (EGD) WITH PROPOFOL;  Surgeon: Lollie Sails, MD;  Location: [facility] ENDOSCOPY;  Service: Endoscopy;  Laterality: N/A;    Family History  Problem Relation Age of Onset  . Alzheimer's disease Father   . Congestive Heart Failure Mother   . Diabetes Mother   . Multiple sclerosis Brother   . Lupus Grandchild   . Stroke Grandchild   . Breast cancer Neg Hx     Social History:  reports that she has never smoked. Her smokeless tobacco use includes snuff. She reports that she does not drink alcohol or use drugs.  She lives in [place]. 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 She worked for [employer]. She denies any exposure to radiation or toxins. She is concerned about finances and paying for new medications. She is on a fixed income. The patient is alone today.  Allergies:  Allergies  Allergen Reactions  . Amoxicillin Other (See Comments)  . Lipitor [Atorvastatin] Other (See Comments)    Weakness in legs Weakness in legs  . Penicillins Other (See Comments)    Pt states medication upsets her stomach    Current Medications: Current Outpatient Medications  Medication Sig Dispense Refill  . acetaminophen (TYLENOL) 500 MG tablet Take 500 mg by mouth  every 8 (eight) hours as needed for moderate pain.     Marland Kitchen aspirin EC 81 MG tablet Take 81 mg by mouth daily.    . Calcium Carbonate-Vitamin D 600-400 MG-UNIT tablet Take 2 tablets by mouth daily.    . Cholecalciferol (VITAMIN D3) 5000 UNITS CAPS Take 2,000 Units by mouth daily.     . hydroxyurea (HYDREA) 500 MG capsule TAKE 2 CAPS MON, WED, FRI, SAT AND TAKE 1 CAPS (500 MG) ON TUE, THURS, SUN. 132  capsule 0  . pantoprazole (PROTONIX) 40 MG tablet Take 40 mg by mouth daily.    . pravastatin (PRAVACHOL) 80 MG tablet Take 80 mg by mouth daily.     . vitamin B-12 (CYANOCOBALAMIN) 1000 MCG tablet Take 1 tablet (1,000 mcg total) by mouth daily. 30 tablet 0  . ondansetron (ZOFRAN) 8 MG tablet Take 8 mg by mouth every 8 (eight) hours as needed.      No current facility-administered medications for this visit.    Review of Systems  Constitutional: Positive for weight loss (1 pound). Negative for chills, diaphoresis, fever and malaise/fatigue.       Feels "good".  HENT: Negative.  Negative for congestion, ear pain, nosebleeds, sinus pain, sore throat and tinnitus.   Eyes: Negative.  Negative for blurred vision, double vision, photophobia, pain, discharge and redness.  Respiratory: Positive for shortness of breath (exertional). Negative for cough, hemoptysis and sputum production.   Cardiovascular: Negative.  Negative for chest pain, palpitations, orthopnea, leg swelling and PND.  Gastrointestinal: Negative.  Negative for abdominal pain, blood in stool, constipation, diarrhea, heartburn, melena, nausea and vomiting.  Genitourinary: Negative.  Negative for dysuria, frequency, hematuria and urgency.  Musculoskeletal: Positive for joint pain (left hip improved; bilateral knees (R>L); right hip pain). Negative for back pain, falls, myalgias and neck pain.       Scoliosis.  Skin: Negative.  Negative for itching and rash.  Neurological: Negative.  Negative for dizziness, tingling, tremors, sensory change, speech change, focal weakness, weakness and headaches.  Endo/Heme/Allergies: Bruises/bleeds easily (occasional bruising).  Psychiatric/Behavioral: Negative.  Negative for depression and memory loss. The patient is not nervous/anxious and does not have insomnia.   All other systems reviewed and are negative.  Performance status (ECOG): 1  Vitals Blood pressure (!) 155/77, pulse 67, temperature (!)  97.3 F (36.3 C), temperature source Tympanic, resp. rate 18, height 5\' 3"  (1.6 m), weight 212 lb 6.6 oz (96.3 kg), SpO2 100 %.   Physical Exam  Constitutional: She is oriented to person, place, and time. She appears well-developed and well-nourished. No distress.  HENT:  Head: Normocephalic and atraumatic.  Mouth/Throat: Oropharynx is clear and moist. No oropharyngeal exudate.  Gray hair pulled back.  Mask.  Eyes: Pupils are equal, round, and reactive to light. Conjunctivae and EOM are normal. No scleral icterus.  Glasses.  Brown eyes.  Neck: No JVD present.  Cardiovascular: Normal rate and regular rhythm. Exam reveals no gallop.  No murmur heard. Pulmonary/Chest: Effort normal and breath sounds normal. She has no wheezes. She has no rales. She exhibits no tenderness.  Abdominal: Soft. Bowel sounds are normal. She exhibits no mass. There is no abdominal tenderness. There is no rebound and no guarding.  Musculoskeletal:        General: Edema (chronic; bilateral lower extremities; R>L) present. No tenderness. Normal range of motion.     Cervical back: Normal range of motion and neck supple.  Lymphadenopathy:    She has no cervical adenopathy.  Neurological: She is alert and oriented to person, place, and time.  Skin: Skin is warm. No rash noted. She is not diaphoretic. No erythema. No pallor.  Psychiatric: She has a normal mood and affect. Her behavior is normal. Judgment and thought content normal.  Nursing note reviewed.   Orders Only on 10/26/2019  Component Date Value Ref Range Status  . Sodium 10/26/2019 136  135 - 145 mmol/L Final  . Potassium 10/26/2019 3.8  3.5 - 5.1 mmol/L Final  . Chloride 10/26/2019 104  98 - 111 mmol/L Final  . CO2 10/26/2019 24  22 - 32 mmol/L Final  . Glucose, Bld 10/26/2019 84  70 - 99 mg/dL Final  . BUN 10/26/2019 8  8 - 23 mg/dL Final  . Creatinine, Ser 10/26/2019 0.91  0.44 - 1.00 mg/dL Final  . Calcium 10/26/2019 8.9  8.9 - 10.3 mg/dL Final  .  Total Protein 10/26/2019 7.7  6.5 - 8.1 g/dL Final  . Albumin 10/26/2019 3.5  3.5 - 5.0 g/dL Final  . AST 10/26/2019 17  15 - 41 U/L Final  . ALT 10/26/2019 13  0 - 44 U/L Final  . Alkaline Phosphatase 10/26/2019 37* 38 - 126 U/L Final  . Total Bilirubin 10/26/2019 0.5  0.3 - 1.2 mg/dL Final  . GFR calc non Af Amer 10/26/2019 >60  >60 mL/min Final  . GFR calc Af Amer 10/26/2019 >60  >60 mL/min Final  . Anion gap 10/26/2019 8  5 - 15 Final   Performed at [facility], [address] [place], [place] [address]  . WBC 10/26/2019 7.4  4.0 - 10.5 K/uL Final  . RBC 10/26/2019 3.81* 3.87 - 5.11 MIL/uL Final  . Hemoglobin 10/26/2019 13.0  12.0 - 15.0 g/dL Final  . HCT 10/26/2019 39.9  36.0 - 46.0 % Final  . MCV 10/26/2019 104.7* 80.0 - 100.0 fL Final  . MCH 10/26/2019 34.1* 26.0 - 34.0 pg Final  . MCHC 10/26/2019 32.6  30.0 - 36.0 g/dL Final  . RDW 10/26/2019 13.9  11.5 - 15.5 % Final  . Platelets 10/26/2019 363  150 - 400 K/uL Final  . nRBC 10/26/2019 0.0  0.0 - 0.2 % Final  . Neutrophils Relative % 10/26/2019 54  % Final  . Neutro Abs 10/26/2019 4.1  1.7 - 7.7 K/uL Final  . Lymphocytes Relative 10/26/2019 34  % Final  . Lymphs Abs 10/26/2019 2.5  0.7 - 4.0 K/uL Final  . Monocytes Relative 10/26/2019 10  % Final  . Monocytes Absolute 10/26/2019 0.7  0.1 - 1.0 K/uL Final  . Eosinophils Relative 10/26/2019 1  % Final  . Eosinophils Absolute 10/26/2019 0.1  0.0 - 0.5 K/uL Final  . Basophils Relative 10/26/2019 1  % Final  . Basophils Absolute 10/26/2019 0.0  0.0 - 0.1 K/uL Final  . Immature Granulocytes 10/26/2019 0  % Final  . Abs Immature Granulocytes 10/26/2019 0.03  0.00 - 0.07 K/uL Final   Performed at [facility], [address], [place], [place] [address]    Assessment:  Molly Perez is a 73 y.o. 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 She has a history of mild leukocytosis, thromobocytosis, and erythrocytosis.  Platelet count has ranged between 512,000 - 622,000 since 03/24/2016. WBC has been mildly elevated (10,400 - 10,600). Hematocrit was normal until 09/18/2016. At that time, hematocrit was 50.7 with a hemoglobin of 16.6. She is on ababy aspirin.  Work-up on 11/05/2016 revealed a hematocrit of  43.7, hemoglobin 14.3, platelets 583,00, WBC 9800 with an ANC of 6600. Normal studies included: Ferritin, iron studies, and erythropoietin level. JAK2waspositive for V617F.  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 Marrow was normocellular to mildy hypercellular for age (40-50%) with trilineage hematopoiesis including a proliferation of atypical megakaryocytes with focal clusters and no increase in blasts. There was no significant increase in marrow reticulin fibers. Storage iron was present. Cytogenetics were normal (46, XX). Flow cytometry was negative. FISH studies for MPN/CML were negative.  She is onhydroxyurea (began 12/21/2016). She is taking 1 tablet 4 days/week and 2 tablets 3 days a week (total 11 tablets/week). She denies any side effects.  Left lower extremity duplex on 01/26/2017 revealed no evidence of a DVT.  EGDon 07/04/2016 revealed reflux esophagitis, H pylori gastritis, and a small hiatal hernia. H pylori was treated with metronidazole, clarithromycin, and omeprazole. Colonoscopyon 07/04/2016 was normal.  Bone densityon 06/05/2017revealed osteopenia with a T-score of -2.1 in the AP spine L2-L4.Bone densityon 04/20/2019 revealed osteopenia with a T-score of -2.0 in the AP spine L1-L3.  Symptomatically, she is doing well.  She denies any B symptoms, bruising or bleeding.  Plan: 1.   Labs today: CBC with diff, CMP. 2.Essential thrombocytosis Clinically, she is doing well.   Hematocrit 39.9.  Hemoglobin 13.0.  MCV 104.7.  Platelets 363,000.  WBC 7400. Platelet  goal <= 400,000. Continue hydroxyurea 1000 mg 4 days/week and500 mg3 days/week. Continue baby aspirin. 3.Osteopenia Bone density in 04/2019 revealed a T score of -2.0 in the AP spine. Continue calcium and vitamin D. 4.   RTC in 3 months for MD assessment and labs (CBC with diff, CMP).  I discussed the assessment and treatment plan with the patient.  The patient was provided an opportunity to ask questions and all were answered.  The patient agreed with the plan and demonstrated an understanding of the instructions.  The patient was advised to call back if the symptoms worsen or if the condition fails to improve as anticipated.   Lequita Asal, MD, PhD    10/26/2019, 11:27 AM  I, Selena Batten, am acting as scribe for [employer]. Mike Gip, MD, PhD.  I, Jian Hodgman C. Mike Gip, MD, have reviewed the above documentation for accuracy and completeness, and I agree with the above.

## 2019-10-25 ENCOUNTER — Other Ambulatory Visit: Payer: Medicare HMO

## 2019-10-25 NOTE — Progress Notes (Signed)
Confirmed Name and DOB. Denies any concerns.  

## 2019-10-26 ENCOUNTER — Inpatient Hospital Stay: Payer: Medicare HMO

## 2019-10-26 ENCOUNTER — Other Ambulatory Visit: Payer: Self-pay

## 2019-10-26 ENCOUNTER — Encounter: Payer: Self-pay | Admitting: Hematology and Oncology

## 2019-10-26 ENCOUNTER — Inpatient Hospital Stay: Payer: Medicare HMO | Attending: Hematology and Oncology | Admitting: Hematology and Oncology

## 2019-10-26 VITALS — BP 155/77 | HR 67 | Temp 97.3°F | Resp 18 | Ht 63.0 in | Wt 212.4 lb

## 2019-10-26 DIAGNOSIS — Z823 Family history of stroke: Secondary | ICD-10-CM | POA: Diagnosis not present

## 2019-10-26 DIAGNOSIS — Z880 Allergy status to penicillin: Secondary | ICD-10-CM | POA: Diagnosis not present

## 2019-10-26 DIAGNOSIS — M8588 Other specified disorders of bone density and structure, other site: Secondary | ICD-10-CM | POA: Diagnosis not present

## 2019-10-26 DIAGNOSIS — M25551 Pain in right hip: Secondary | ICD-10-CM | POA: Diagnosis not present

## 2019-10-26 DIAGNOSIS — Z833 Family history of diabetes mellitus: Secondary | ICD-10-CM | POA: Insufficient documentation

## 2019-10-26 DIAGNOSIS — Z79899 Other long term (current) drug therapy: Secondary | ICD-10-CM | POA: Insufficient documentation

## 2019-10-26 DIAGNOSIS — R0602 Shortness of breath: Secondary | ICD-10-CM | POA: Diagnosis not present

## 2019-10-26 DIAGNOSIS — Z8719 Personal history of other diseases of the digestive system: Secondary | ICD-10-CM | POA: Diagnosis not present

## 2019-10-26 DIAGNOSIS — M25562 Pain in left knee: Secondary | ICD-10-CM | POA: Diagnosis not present

## 2019-10-26 DIAGNOSIS — R7303 Prediabetes: Secondary | ICD-10-CM | POA: Diagnosis not present

## 2019-10-26 DIAGNOSIS — M8580 Other specified disorders of bone density and structure, unspecified site: Secondary | ICD-10-CM | POA: Insufficient documentation

## 2019-10-26 DIAGNOSIS — M25552 Pain in left hip: Secondary | ICD-10-CM | POA: Insufficient documentation

## 2019-10-26 DIAGNOSIS — Z8269 Family history of other diseases of the musculoskeletal system and connective tissue: Secondary | ICD-10-CM | POA: Diagnosis not present

## 2019-10-26 DIAGNOSIS — M25561 Pain in right knee: Secondary | ICD-10-CM | POA: Diagnosis not present

## 2019-10-26 DIAGNOSIS — D473 Essential (hemorrhagic) thrombocythemia: Secondary | ICD-10-CM

## 2019-10-26 DIAGNOSIS — Z8249 Family history of ischemic heart disease and other diseases of the circulatory system: Secondary | ICD-10-CM | POA: Insufficient documentation

## 2019-10-26 DIAGNOSIS — Z818 Family history of other mental and behavioral disorders: Secondary | ICD-10-CM | POA: Diagnosis not present

## 2019-10-26 LAB — COMPREHENSIVE METABOLIC PANEL
ALT: 13 U/L (ref 0–44)
AST: 17 U/L (ref 15–41)
Albumin: 3.5 g/dL (ref 3.5–5.0)
Alkaline Phosphatase: 37 U/L — ABNORMAL LOW (ref 38–126)
Anion gap: 8 (ref 5–15)
BUN: 8 mg/dL (ref 8–23)
CO2: 24 mmol/L (ref 22–32)
Calcium: 8.9 mg/dL (ref 8.9–10.3)
Chloride: 104 mmol/L (ref 98–111)
Creatinine, Ser: 0.91 mg/dL (ref 0.44–1.00)
GFR calc Af Amer: >60 mL/min (ref >60)
GFR calc non Af Amer: >60 mL/min (ref >60)
Glucose, Bld: 84 mg/dL (ref 70–99)
Potassium: 3.8 mmol/L (ref 3.5–5.1)
Sodium: 136 mmol/L (ref 135–145)
Total Bilirubin: 0.5 mg/dL (ref 0.3–1.2)
Total Protein: 7.7 g/dL (ref 6.5–8.1)

## 2019-10-26 LAB — CBC WITH DIFFERENTIAL/PLATELET
Abs Immature Granulocytes: 0.03 10*3/uL (ref 0.00–0.07)
Basophils Absolute: 0 10*3/uL (ref 0.0–0.1)
Basophils Relative: 1 %
Eosinophils Absolute: 0.1 10*3/uL (ref 0.0–0.5)
Eosinophils Relative: 1 %
HCT: 39.9 % (ref 36.0–46.0)
Hemoglobin: 13 g/dL (ref 12.0–15.0)
Immature Granulocytes: 0 %
Lymphocytes Relative: 34 %
Lymphs Abs: 2.5 10*3/uL (ref 0.7–4.0)
MCH: 34.1 pg — ABNORMAL HIGH (ref 26.0–34.0)
MCHC: 32.6 g/dL (ref 30.0–36.0)
MCV: 104.7 fL — ABNORMAL HIGH (ref 80.0–100.0)
Monocytes Absolute: 0.7 10*3/uL (ref 0.1–1.0)
Monocytes Relative: 10 %
Neutro Abs: 4.1 10*3/uL (ref 1.7–7.7)
Neutrophils Relative %: 54 %
Platelets: 363 10*3/uL (ref 150–400)
RBC: 3.81 MIL/uL — ABNORMAL LOW (ref 3.87–5.11)
RDW: 13.9 % (ref 11.5–15.5)
WBC: 7.4 10*3/uL (ref 4.0–10.5)
nRBC: 0 % (ref 0.0–0.2)

## 2019-11-20 ENCOUNTER — Ambulatory Visit (INDEPENDENT_AMBULATORY_CARE_PROVIDER_SITE_OTHER): Payer: Medicare HMO

## 2019-11-20 ENCOUNTER — Encounter: Payer: Self-pay | Admitting: Emergency Medicine

## 2019-11-20 ENCOUNTER — Ambulatory Visit
Admission: EM | Admit: 2019-11-20 | Discharge: 2019-11-20 | Disposition: A | Discharge status: Home / Self Care | Payer: Medicare HMO | Attending: Family Medicine | Admitting: Family Medicine

## 2019-11-20 ENCOUNTER — Other Ambulatory Visit: Payer: Self-pay

## 2019-11-20 DIAGNOSIS — W010XXA Fall on same level from slipping, tripping and stumbling without subsequent striking against object, initial encounter: Secondary | ICD-10-CM | POA: Diagnosis not present

## 2019-11-20 DIAGNOSIS — M79675 Pain in left toe(s): Secondary | ICD-10-CM

## 2019-11-20 DIAGNOSIS — S90112A Contusion of left great toe without damage to nail, initial encounter: Secondary | ICD-10-CM | POA: Diagnosis not present

## 2019-11-20 NOTE — ED Triage Notes (Signed)
Patient states that she fell on October 30 2019 and injured her left big toe.  Patient c/o ongoing pain and swelling in her left big toe.

## 2019-11-20 NOTE — Discharge Instructions (Signed)
Rest, elevation, over the counter analgesic

## 2019-11-25 NOTE — ED Provider Notes (Signed)
[facility]    CSN: [id number] Arrival date & time: 11/20/19  1242      History   Chief Complaint Chief Complaint  Patient presents with  . Toe Pain    left big toe    HPI Molly Perez is a 74 y.o. female.   74 yo female with a c/o left big toe pain for the past 3 weeks since falling and injuring it. States she fell and twisted, stubbed her toe 3 weeks ago and has been hurting since then. Denies any laceration, fevers, chills, numbness/tingling.    Toe Pain    Past Medical History:  Diagnosis Date  . Cataract cortical, senile   . GERD (gastroesophageal reflux disease)   . Hypercholesterolemia   . Obesity   . Pre-diabetes   . Prediabetes   . Vitamin D deficiency     Patient Active Problem List   Diagnosis Date Noted  . Osteopenia of spine 04/26/2019  . 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 Back pain, chronic 05/09/2015  . Hypercholesteremia 05/09/2015  . Obesity, Class II, BMI 35-39.9 05/09/2015  . Prediabetes 05/09/2015  . Avitaminosis D 05/09/2015    Past Surgical History:  Procedure Laterality Date  . ABDOMINAL HYSTERECTOMY    . COLONOSCOPY    . COLONOSCOPY WITH PROPOFOL N/A 07/04/2016   Procedure: COLONOSCOPY WITH PROPOFOL;  Surgeon: Lollie Sails, MD;  Location: [facility] ENDOSCOPY;  Service: Endoscopy;  Laterality: N/A;  . ESOPHAGOGASTRODUODENOSCOPY (EGD) WITH PROPOFOL N/A 07/04/2016   Procedure: ESOPHAGOGASTRODUODENOSCOPY (EGD) WITH PROPOFOL;  Surgeon: Lollie Sails, MD;  Location: [facility] ENDOSCOPY;  Service: Endoscopy;  Laterality: N/A;    OB History   No obstetric history on file.      Home Medications    Prior to Admission medications   Medication Sig Start Date End Date Taking? Authorizing Provider  aspirin EC 81 MG tablet Take 81 mg by mouth daily.   Yes [provider]  Calcium Carbonate-Vitamin D 600-400 MG-UNIT tablet Take 2 tablets by  mouth daily.   Yes [provider]  Cholecalciferol (VITAMIN D3) 5000 UNITS CAPS Take 2,000 Units by mouth daily.  03/29/15  Yes [provider]  hydroxyurea (HYDREA) 500 MG capsule TAKE 2 CAPS MON, WED, FRI, SAT AND TAKE 1 CAPS (500 MG) ON TUE, THURS, SUN. 10/02/19  Yes Corcoran, Melissa C, MD  pantoprazole (PROTONIX) 40 MG tablet Take 40 mg by mouth daily.   Yes [provider]  pravastatin (PRAVACHOL) 80 MG tablet Take 80 mg by mouth daily.  07/08/16 04/14/38 Yes [provider]  vitamin B-12 (CYANOCOBALAMIN) 1000 MCG tablet Take 1 tablet (1,000 mcg total) by mouth daily. 08/01/15  Yes Plonk, Gwyndolyn Saxon, MD  acetaminophen (TYLENOL) 500 MG tablet Take 500 mg by mouth every 8 (eight) hours as needed for moderate pain.     [provider]  ondansetron (ZOFRAN) 8 MG tablet Take 8 mg by mouth every 8 (eight) hours as needed.  12/17/16   [provider]    Family History Family History  Problem Relation Age of Onset  . Alzheimer's disease Father   . Congestive Heart Failure Mother   . Diabetes Mother   . Multiple sclerosis Brother   . Lupus Grandchild   . Stroke Grandchild   . Breast cancer Neg Hx     Social History Social History   Tobacco Use  . Smoking status: Never Smoker  . Smokeless tobacco:  Current User    Types: Snuff  Substance Use Topics  . Alcohol use: No    Alcohol/week: 0.0 standard drinks  . Drug use: No     Allergies   Amoxicillin, Lipitor [atorvastatin], and Penicillins   Review of Systems Review of Systems   Physical Exam Triage Vital Signs ED Triage Vitals  Enc Vitals Group     BP 11/20/19 1306 (!) 149/81     Pulse Rate 11/20/19 1306 67     Resp 11/20/19 1306 16     Temp 11/20/19 1306 98.3 F (36.8 C)     Temp Source 11/20/19 1306 Oral     SpO2 11/20/19 1306 100 %     Weight 11/20/19 1302 200 lb (90.7 kg)     Height 11/20/19 1302 5\' 1"  (1.549 m)     Head Circumference --      Peak Flow --      Pain  Score 11/20/19 1302 2     Pain Loc --      Pain Edu? --      Excl. in [place]? --    No data found.  Updated Vital Signs BP (!) 149/81 (BP Location: Right Arm)   Pulse 67   Temp 98.3 F (36.8 C) (Oral)   Resp 16   Ht 5\' 1"  (1.549 m)   Wt 90.7 kg   SpO2 100%   BMI 37.79 kg/m   Visual Acuity Right Eye Distance:   Left Eye Distance:   Bilateral Distance:    Right Eye Near:   Left Eye Near:    Bilateral Near:     Physical Exam Vitals and nursing note reviewed.  Constitutional:      General: She is not in acute distress.    Appearance: She is not toxic-appearing or diaphoretic.  Musculoskeletal:     Left foot: Normal range of motion and normal capillary refill. Bunion, tenderness and bony tenderness (over the great toe) present. No swelling, deformity, Charcot foot, foot drop, laceration or crepitus. Normal pulse.     Comments: Left foot neurovascularly intact  Neurological:     Mental Status: She is alert.      UC Treatments / Results  Labs (all labs ordered are listed, but only abnormal results are displayed) Labs Reviewed - No data to display  EKG   Radiology No results found.  Procedures Procedures (including critical care time)  Medications Ordered in UC Medications - No data to display  Initial Impression / Assessment and Plan / UC Course  I have reviewed the triage vital signs and the nursing notes.  Pertinent labs & imaging results that were available during my care of the patient were reviewed by me and considered in my medical decision making (see chart for details).      Final Clinical Impressions(s) / UC Diagnoses   Final diagnoses:  Contusion of left great toe without damage to nail, initial encounter     Discharge Instructions     Rest, elevation, over the counter analgesic    ED Prescriptions    None      1. x-ray results and diagnosis reviewed with patient 2. Recommend supportive treatment as above 3. Follow-up prn if  symptoms worsen or don't improve   PDMP not reviewed this encounter.   Norval Gable, MD 11/25/19 [phone]

## 2019-12-19 ENCOUNTER — Other Ambulatory Visit: Payer: Self-pay | Admitting: Hematology and Oncology

## 2019-12-19 DIAGNOSIS — D473 Essential (hemorrhagic) thrombocythemia: Secondary | ICD-10-CM

## 2019-12-19 NOTE — Telephone Encounter (Signed)
CBC with Differential/Platelet Order: [id number] Status:  Final result Visible to patient:  No (inaccessible in MyChart) Next appt:  01/24/2020 at 11:00 AM in Oncology ([facility]) Dx:  Essential thrombocythemia ([place])  Ref Range & Units 1 mo ago 4 mo ago 8 mo ago  WBC 4.0 - 10.5 K/uL 7.4  6.7  7.6   RBC 3.87 - 5.11 MIL/uL 3.81Low   3.87  3.65Low    Hemoglobin 12.0 - 15.0 g/dL 13.0  13.0  12.9   HCT 36.0 - 46.0 % 39.9  40.3  38.5   MCV 80.0 - 100.0 fL 104.7High   104.1High   105.5High    MCH 26.0 - 34.0 pg 34.1High   33.6  35.3High    MCHC 30.0 - 36.0 g/dL 32.6  32.3  33.5   RDW 11.5 - 15.5 % 13.9  13.5  12.8   Platelets 150 - 400 K/uL 363  321  348   nRBC 0.0 - 0.2 % 0.0  0.0  0.0   Neutrophils Relative % % 54  46  52   Neutro Abs 1.7 - 7.7 K/uL 4.1  3.1  3.9   Lymphocytes Relative % 34  42  35   Lymphs Abs 0.7 - 4.0 K/uL 2.5  2.8  2.7   Monocytes Relative % 10  10  12    Monocytes Absolute 0.1 - 1.0 K/uL 0.7  0.7  0.9   Eosinophils Relative % 1  1  1    Eosinophils Absolute 0.0 - 0.5 K/uL 0.1  0.0  0.1   Basophils Relative % 1  1  0   Basophils Absolute 0.0 - 0.1 K/uL 0.0  0.0  0.0   Immature Granulocytes % 0  0  0   Abs Immature Granulocytes 0.00 - 0.07 K/uL 0.03  0.01 CM  0.03 CM   Comment: Performed at [facility], [address] [place], [place] [address]  Resulting Agency  [facility] CLIN LAB [facility] CLIN LAB [facility] LAB      Specimen Collected: 10/26/19 10:49 Last Resulted: 10/26/19 11:00     Lab Flowsheet   Order Details   View Encounter   Lab and Collection Details   Routing   Result History     CM=Additional comments      Other Results from 10/26/2019  Contains abnormal data Comprehensive metabolic panel  Status:  Final result Visible to patient:  No (inaccessible in MyChart) Next appt:  01/24/2020 at 11:00 AM in Oncology ([facility]) Dx:  Essential thrombocythemia ([place]) Order: [id number]  Ref Range & Units 1 mo ago 4 mo ago 8 mo ago  Sodium  135 - 145 mmol/L 136  135  141   Potassium 3.5 - 5.1 mmol/L 3.8  3.5  3.5   Chloride 98 - 111 mmol/L 104  102  109   CO2 22 - 32 mmol/L 24  25  25    Glucose, Bld 70 - 99 mg/dL 84  90  82   BUN 8 - 23 mg/dL 8  10  11    Creatinine, Ser 0.44 - 1.00 mg/dL 0.91  0.96  0.88   Calcium 8.9 - 10.3 mg/dL 8.9  8.8Low   8.8Low    Total Protein 6.5 - 8.1 g/dL 7.7  7.8  7.4   Albumin 3.5 - 5.0 g/dL 3.5  3.5  3.4Low    AST 15 - 41 U/L 17  18  22    ALT 0 - 44 U/L 13  14  19   Alkaline Phosphatase 38 - 126 U/L 37Low   40  41   Total Bilirubin 0.3 - 1.2 mg/dL 0.5  0.5  0.3   GFR calc non Af Amer >60 mL/min >60  58Low   >60   GFR calc Af Amer >60 mL/min >60  >60  >60   Anion gap 5 - 15 8  8  CM  7 CM   Comment: Performed at [facility], [address], [place], [place] [address]  Resulting Agency  [facility] CLIN LAB [facility] CLIN LAB [facility] CL[facility] LAB      Specimen Collected: 10/26/19 10:49 Last Resulted: 10/26/19 11:16

## 2020-01-22 NOTE — Progress Notes (Signed)
[facility]  [address], [address] [place], [place] [address] Phone: [phone]  Fax: [phone]   Clinic Day:  01/24/2020  Referring physician: Barbaraann Boys, MD  Chief Complaint: Molly Perez is a 74 y.o. 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  HPI: The patient was last seen in the hematology clinic on 10/26/2019. At that time, she was doing well. She denied any B symptoms, bruising or bleeding. Hematocrit was 39.9, hemoglobin 13.0, MCV 104.7, platelets 363,000, WBC 7,400.  She continued hydroxyurea 1,000 mg 4 days/week and 500 mg 3 days/week.  Patient remained on calcium and vitamin D for her osteopenia.   During the interim, she has felt "fine".  She denies any B symptoms. She has occasional bruising and is unsure how it got there. Her exertional shortness of breath is stable. She notes it is harder to breath with the mask. She has no joint pain today. Patient remains on calcium and vitamin D for her osteopenia.   Patient had her last COVID-19 vaccine on 01/21/2020. She notes on 01/22/2020 she had no energy and she felt like she had a fever with chills.    Past Medical History:  Diagnosis Date  . Cataract cortical, senile   . GERD (gastroesophageal reflux disease)   . Hypercholesterolemia   . Obesity   . Pre-diabetes   . Prediabetes   . Vitamin D deficiency     Past Surgical History:  Procedure Laterality Date  . ABDOMINAL HYSTERECTOMY    . COLONOSCOPY    . COLONOSCOPY WITH PROPOFOL N/A 07/04/2016   Procedure: COLONOSCOPY WITH PROPOFOL;  Surgeon: Lollie Sails, MD;  Location[facility] ENDOSCOPY;  Service: Endoscopy;  Laterality: N/A;  . ESOPHAGOGASTRODUODENOSCOPY (EGD) WITH PROPOFOL N/A 07/04/2016   Procedure: ESOPHAGOGASTRODUODENOSCOPY (EGD) WITH PROPOFOL;  Surgeon: Lollie Sails, MD;  Location: [facility] ENDOSCOPY;  Service: Endoscopy;  Laterality: N/A;    Family History  Problem  Relation Age of Onset  . Alzheimer's disease Father   . Congestive Heart Failure Mother   . Diabetes Mother   . Multiple sclerosis Brother   . Lupus Grandchild   . Stroke Grandchild   . Breast cancer Neg Hx     Social History:  reports that she has never smoked. Her smokeless tobacco use includes snuff. She reports that she does not drink alcohol or use drugs. lives in [place]. 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 She retired at age 58. She worked for [employer]. She denies any exposure to radiation or toxins. She is concerned about finances and paying for new medications. She is on a fixed income. The patient is alone today.  Allergies:  Allergies  Allergen Reactions  . Amoxicillin Other (See Comments)  . Lipitor [Atorvastatin] Other (See Comments)    Weakness in legs Weakness in legs  . Penicillins Other (See Comments)    Pt states medication upsets her stomach    Current Medications: Current Outpatient Medications  Medication Sig Dispense Refill  . acetaminophen (TYLENOL) 500 MG tablet Take 500 mg by mouth every 8 (eight) hours as needed for moderate pain.     Marland Kitchen aspirin EC 81 MG tablet Take 81 mg by mouth daily.    . Calcium Carbonate-Vitamin D 600-400 MG-UNIT tablet Take 2 tablets by mouth daily.    . Cholecalciferol (VITAMIN D3) 5000 UNITS CAPS Take 2,000 Units by mouth daily.     . hydroxyurea (HYDREA) 500  MG capsule TAKE 2 CAPS MON, WED, FRI, SAT AND TAKE 1 CAPS (500 MG) ON TUE, THURS, SUN. 132 capsule 0  . ondansetron (ZOFRAN) 8 MG tablet Take 8 mg by mouth every 8 (eight) hours as needed.     . pantoprazole (PROTONIX) 40 MG tablet Take 40 mg by mouth daily.    . pravastatin (PRAVACHOL) 80 MG tablet Take 80 mg by mouth daily.     . vitamin B-12 (CYANOCOBALAMIN) 1000 MCG tablet Take 1 tablet (1,000 mcg total) by mouth daily. 30 tablet 0   No current facility-administered medications for this visit.    Review of Systems   Constitutional: Positive for chills (after covid vaccine; on 01/22/2020), fever (after covid vaccine; on 01/22/2020) and malaise/fatigue (after covid vaccine; on 01/22/2020). Negative for diaphoresis and weight loss (stable).       Feels "good".  HENT: Negative.  Negative for congestion, ear pain, nosebleeds, sinus pain, sore throat and tinnitus.   Eyes: Negative.  Negative for blurred vision, double vision, photophobia, pain, discharge and redness.  Respiratory: Positive for shortness of breath (exertional; stable). Negative for cough, hemoptysis and sputum production.   Cardiovascular: Negative.  Negative for chest pain, palpitations, orthopnea, leg swelling and PND.  Gastrointestinal: Negative.  Negative for abdominal pain, blood in stool, constipation, diarrhea, heartburn, melena, nausea and vomiting.  Genitourinary: Negative.  Negative for dysuria, frequency, hematuria and urgency.  Musculoskeletal: Negative for back pain, falls, joint pain (left hip improved; bilateral knees (R>L); right hip pain; resolved), myalgias and neck pain.       Scoliosis.  Skin: Negative.  Negative for itching and rash.  Neurological: Negative.  Negative for dizziness, tingling, tremors, sensory change, speech change, focal weakness, weakness and headaches.  Endo/Heme/Allergies: Bruises/bleeds easily (occasional bruising).  Psychiatric/Behavioral: Negative.  Negative for depression and memory loss. The patient is not nervous/anxious and does not have insomnia.   All other systems reviewed and are negative.  Performance status (ECOG): 1  Vitals Blood pressure (!) 156/97, pulse 66, temperature (!) 96.3 F (35.7 C), temperature source Tympanic, resp. rate 18, weight 211 lb 10.3 oz (96 kg), SpO2 100 %.   Physical Exam Vitals and nursing note reviewed.  Constitutional:      General: She is not in acute distress.    Appearance: She is well-developed and well-nourished. She is not diaphoretic.  HENT:     Head:  Normocephalic and atraumatic.     Mouth/Throat:     Mouth: Oropharynx is clear and moist.     Pharynx: No oropharyngeal exudate.      Comments: Pearline Cables hair pulled back.  Mask. Eyes:     General: No scleral icterus.    Extraocular Movements: EOM normal.     Conjunctiva/sclera: Conjunctivae normal.     Pupils: Pupils are equal, round, and reactive to light.     Comments: Glasses.  Brown eyes.  Neck:     Vascular: No JVD.  Cardiovascular:     Rate and Rhythm: Normal rate and regular rhythm.     Heart sounds: No murmur heard.  No gallop.   Pulmonary:     Effort: Pulmonary effort is normal.     Breath sounds: Normal breath sounds. No wheezing or rales.  Chest:     Chest wall: No tenderness.  Abdominal:     General: Bowel sounds are normal.     Palpations: Abdomen is soft. There is no mass.     Tenderness: There is no abdominal tenderness. There is no  guarding or rebound.  Musculoskeletal:        General: No tenderness or edema. Normal range of motion.     Cervical back: Normal range of motion and neck supple.  Lymphadenopathy:     Head:     Right side of head: No preauricular, posterior auricular or occipital adenopathy.     Left side of head: No preauricular, posterior auricular or occipital adenopathy.     Cervical: No cervical adenopathy.     Upper Body:  No axillary adenopathy present.    Right upper body: No supraclavicular adenopathy.     Left upper body: No supraclavicular adenopathy.     Lower Body: No right inguinal adenopathy. No left inguinal adenopathy.  Skin:    General: Skin is warm.     Coloration: Skin is not pale.     Findings: No erythema or rash.  Neurological:     Mental Status: She is alert and oriented to person, place, and time.  Psychiatric:        Mood and Affect: Mood and affect normal.        Behavior: Behavior normal.        Thought Content: Thought content normal.        Judgment: Judgment normal.      Appointment on 01/24/2020  Component  Date Value Ref Range Status  . WBC 01/24/2020 8.1  4.0 - 10.5 K/uL Final  . RBC 01/24/2020 3.84* 3.87 - 5.11 MIL/uL Final  . Hemoglobin 01/24/2020 13.1  12.0 - 15.0 g/dL Final  . HCT 01/24/2020 40.9  36.0 - 46.0 % Final  . MCV 01/24/2020 106.5* 80.0 - 100.0 fL Final  . MCH 01/24/2020 34.1* 26.0 - 34.0 pg Final  . MCHC 01/24/2020 32.0  30.0 - 36.0 g/dL Final  . RDW 01/24/2020 13.1  11.5 - 15.5 % Final  . Platelets 01/24/2020 374  150 - 400 K/uL Final  . nRBC 01/24/2020 0.0  0.0 - 0.2 % Final  . Neutrophils Relative % 01/24/2020 57  % Final  . Neutro Abs 01/24/2020 4.6  1.7 - 7.7 K/uL Final  . Lymphocytes Relative 01/24/2020 33  % Final  . Lymphs Abs 01/24/2020 2.7  0.7 - 4.0 K/uL Final  . Monocytes Relative 01/24/2020 8  % Final  . Monocytes Absolute 01/24/2020 0.6  0.1 - 1.0 K/uL Final  . Eosinophils Relative 01/24/2020 1  % Final  . Eosinophils Absolute 01/24/2020 0.1  0.0 - 0.5 K/uL Final  . Basophils Relative 01/24/2020 1  % Final  . Basophils Absolute 01/24/2020 0.0  0.0 - 0.1 K/uL Final  . Immature Granulocytes 01/24/2020 0  % Final  . Abs Immature Granulocytes 01/24/2020 0.03  0.00 - 0.07 K/uL Final   Performed at [facility], [address] [place], [place] [address]  . Sodium 01/24/2020 134* 135 - 145 mmol/L Final  . Potassium 01/24/2020 3.6  3.5 - 5.1 mmol/L Final  . Chloride 01/24/2020 101  98 - 111 mmol/L Final  . CO2 01/24/2020 25  22 - 32 mmol/L Final  . Glucose, Bld 01/24/2020 97  70 - 99 mg/dL Final   Glucose reference range applies only to samples taken after fasting for at least 8 hours.  . BUN 01/24/2020 12  8 - 23 mg/dL Final  . Creatinine, Ser 01/24/2020 0.96  0.44 - 1.00 mg/dL Final  . Calcium 01/24/2020 9.1  8.9 - 10.3 mg/dL Final  . Total Protein 01/24/2020 7.3  6.5 - 8.1 g/dL Final  .  Albumin 01/24/2020 3.5  3.5 - 5.0 g/dL Final  . AST 01/24/2020 16  15 - 41 U/L Final  . ALT 01/24/2020 13  0 - 44 U/L Final  . Alkaline Phosphatase 01/24/2020  36* 38 - 126 U/L Final  . Total Bilirubin 01/24/2020 0.6  0.3 - 1.2 mg/dL Final  . GFR calc non Af Amer 01/24/2020 58* >60 mL/min Final  . GFR calc Af Amer 01/24/2020 >60  >60 mL/min Final  . Anion gap 01/24/2020 8  5 - 15 Final   Performed at [facility], [address] [place],  [address]    Assessment:  Molly Perez is a 74 y.o. 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 She has a history of mild leukocytosis, thromobocytosis, and erythrocytosis. Platelet count has ranged between 512,000 - 622,000 since 03/24/2016. WBC has been mildly elevated (10,400 - 10,600). Hematocrit was normal until 09/18/2016. At that time, hematocrit was 50.7 with a hemoglobin of 16.6. She is on ababy aspirin.  Work-up on 11/05/2016 revealed a hematocrit of 43.7, hemoglobin 14.3, platelets 583,00, WBC 9800 with an ANC of 6600. Normal studies included: Ferritin, iron studies, and erythropoietin level. JAK2waspositive for V617F.  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 Marrow was normocellular to mildy hypercellular for age (40-50%) with trilineage hematopoiesis including a proliferation of atypical megakaryocytes with focal clusters and no increase in blasts. There was no significant increase in marrow reticulin fibers. Storage iron was present. Cytogenetics were normal (64, XX). Flow cytometry was negative. FISH studies for MPN/CML were negative.  She is onhydroxyurea (began 12/21/2016). She is taking 1 tablet 4 days/week and 2 tablets 3 days a week (total 11 tablets/week). She denies any side effects.  Left lower extremity duplex on 01/26/2017 revealed no evidence of a DVT.  EGDon 07/04/2016 revealed reflux esophagitis, H pylori gastritis, and a small hiatal hernia. H pylori was treated with metronidazole, clarithromycin, and omeprazole.  Colonoscopyon 07/04/2016 was normal.  Bone densityon 06/05/2017revealed osteopenia with a T-score of -2.1 in the AP spine L2-L4.Bone densityon 04/20/2019 revealed osteopenia with a T-score of -2.0 in the AP spine L1-L3.  Patient received her last COVID-19 vaccine on 01/21/2020.  Symptomatically, she is doing well.  She denied any B symptoms.  Exam is stable.  Plan: 1.   Labs today: CBC with diff, CMP. 2.Essential thrombocytosis Clinically, she continues to do well.  Hematocrit 40.9. Hemoglobin 13.1. MCV 106.5. Platelets 374,000. WBC 8100. Platelet goal<=400,000. Continue hydroxyurea 1000 mg4 days/weekand500 mg3 days/week. Continue baby aspirin. 3.Osteopenia Bone density in 04/2019 revealed a T score of -2.0 in the AP spine. Continue calcium and vitamin D. 4.RTC in 3 months for MD assessment and labs (CBC with diff, CMP).   I discussed the assessment and treatment plan with the patient.  The patient was provided an opportunity to ask questions and all were answered.  The patient agreed with the plan and demonstrated an understanding of the instructions.  The patient was advised to call back if the symptoms worsen or if the condition fails to improve as anticipated.   Lequita Asal, MD, PhD    01/24/2020, 12:02 PM  I, Selena Batten, am acting as scribe for [employer]. Mike Gip, MD, PhD.  I, Matricia Begnaud C. Mike Gip, MD, have reviewed the above documentation for accuracy and completeness, and I agree with the above.

## 2020-01-24 ENCOUNTER — Inpatient Hospital Stay: Payer: Medicare HMO | Admitting: Hematology and Oncology

## 2020-01-24 ENCOUNTER — Other Ambulatory Visit: Payer: Self-pay

## 2020-01-24 ENCOUNTER — Encounter: Payer: Self-pay | Admitting: Hematology and Oncology

## 2020-01-24 ENCOUNTER — Inpatient Hospital Stay: Payer: Medicare HMO | Attending: Hematology and Oncology

## 2020-01-24 VITALS — BP 156/97 | HR 66 | Temp 96.3°F | Resp 18 | Wt 211.6 lb

## 2020-01-24 DIAGNOSIS — D473 Essential (hemorrhagic) thrombocythemia: Secondary | ICD-10-CM | POA: Diagnosis not present

## 2020-01-24 DIAGNOSIS — M8588 Other specified disorders of bone density and structure, other site: Secondary | ICD-10-CM

## 2020-01-24 LAB — CBC WITH DIFFERENTIAL/PLATELET
Abs Immature Granulocytes: 0.03 10*3/uL (ref 0.00–0.07)
Basophils Absolute: 0 10*3/uL (ref 0.0–0.1)
Basophils Relative: 1 %
Eosinophils Absolute: 0.1 10*3/uL (ref 0.0–0.5)
Eosinophils Relative: 1 %
HCT: 40.9 % (ref 36.0–46.0)
Hemoglobin: 13.1 g/dL (ref 12.0–15.0)
Immature Granulocytes: 0 %
Lymphocytes Relative: 33 %
Lymphs Abs: 2.7 10*3/uL (ref 0.7–4.0)
MCH: 34.1 pg — ABNORMAL HIGH (ref 26.0–34.0)
MCHC: 32 g/dL (ref 30.0–36.0)
MCV: 106.5 fL — ABNORMAL HIGH (ref 80.0–100.0)
Monocytes Absolute: 0.6 10*3/uL (ref 0.1–1.0)
Monocytes Relative: 8 %
Neutro Abs: 4.6 10*3/uL (ref 1.7–7.7)
Neutrophils Relative %: 57 %
Platelets: 374 10*3/uL (ref 150–400)
RBC: 3.84 MIL/uL — ABNORMAL LOW (ref 3.87–5.11)
RDW: 13.1 % (ref 11.5–15.5)
WBC: 8.1 10*3/uL (ref 4.0–10.5)
nRBC: 0 % (ref 0.0–0.2)

## 2020-01-24 LAB — COMPREHENSIVE METABOLIC PANEL
ALT: 13 U/L (ref 0–44)
AST: 16 U/L (ref 15–41)
Albumin: 3.5 g/dL (ref 3.5–5.0)
Alkaline Phosphatase: 36 U/L — ABNORMAL LOW (ref 38–126)
Anion gap: 8 (ref 5–15)
BUN: 12 mg/dL (ref 8–23)
CO2: 25 mmol/L (ref 22–32)
Calcium: 9.1 mg/dL (ref 8.9–10.3)
Chloride: 101 mmol/L (ref 98–111)
Creatinine, Ser: 0.96 mg/dL (ref 0.44–1.00)
GFR calc Af Amer: >60 mL/min (ref >60)
GFR calc non Af Amer: 58 mL/min — ABNORMAL LOW (ref >60)
Glucose, Bld: 97 mg/dL (ref 70–99)
Potassium: 3.6 mmol/L (ref 3.5–5.1)
Sodium: 134 mmol/L — ABNORMAL LOW (ref 135–145)
Total Bilirubin: 0.6 mg/dL (ref 0.3–1.2)
Total Protein: 7.3 g/dL (ref 6.5–8.1)

## 2020-03-12 ENCOUNTER — Other Ambulatory Visit: Payer: Self-pay | Admitting: Hematology and Oncology

## 2020-03-12 DIAGNOSIS — D473 Essential (hemorrhagic) thrombocythemia: Secondary | ICD-10-CM

## 2020-03-12 NOTE — Telephone Encounter (Signed)
CBC with Differential/Platelet Order: [id number] Status:  Final result  Visible to patient:  No (inaccessible in MyChart)  Next appt:  04/24/2020 at 10:30 AM in Oncology ([facility])  Dx:  Essential thrombocythemia ([place])  Ref Range & Units 1 mo ago  WBC 4.0 - 10.5 K/uL 8.1   RBC 3.87 - 5.11 MIL/uL 3.84Low    Hemoglobin 12.0 - 15.0 g/dL 13.1   HCT 36.0 - 46.0 % 40.9   MCV 80.0 - 100.0 fL 106.5High    MCH 26.0 - 34.0 pg 34.1High    MCHC 30.0 - 36.0 g/dL 32.0   RDW 11.5 - 15.5 % 13.1   Platelets 150 - 400 K/uL 374   nRBC 0.0 - 0.2 % 0.0   Neutrophils Relative % % 57   Neutro Abs 1.7 - 7.7 K/uL 4.6   Lymphocytes Relative % 33   Lymphs Abs 0.7 - 4.0 K/uL 2.7   Monocytes Relative % 8   Monocytes Absolute 0.1 - 1.0 K/uL 0.6   Eosinophils Relative % 1   Eosinophils Absolute 0.0 - 0.5 K/uL 0.1   Basophils Relative % 1   Basophils Absolute 0.0 - 0.1 K/uL 0.0   Immature Granulocytes % 0   Abs Immature Granulocytes 0.00 - 0.07 K/uL 0.03   Comment: Performed at [facility], [address] [place], [place] [address]  Resulting Agency  [facility]      Specimen Collected: 01/24/20 10:57 Last Resulted: 01/24/20 11:06     Lab Flowsheet   Order Details   View Encounter   Lab and Collection Details   Routing   Result History         Other Results from 01/24/2020  Contains abnormal data Comprehensive metabolic panel  Status:  Final result  Visible to patient:  No (inaccessible in MyChart)  Next appt:  04/24/2020 at 10:30 AM in Oncology ([facility])  Dx:  Essential thrombocythemia ([place]) Order: [id number]  Ref Range & Units 1 mo ago  Sodium 135 - 145 mmol/L 134Low    Potassium 3.5 - 5.1 mmol/L 3.6   Chloride 98 - 111 mmol/L 101   CO2 22 - 32 mmol/L 25   Glucose, Bld 70 - 99 mg/dL 97   Comment: Glucose reference range applies only to samples taken after fasting for at least 8 hours.  BUN 8 - 23 mg/dL 12   Creatinine, Ser 0.44 - 1.00 mg/dL 0.96   Calcium  8.9 - 10.3 mg/dL 9.1   Total Protein 6.5 - 8.1 g/dL 7.3   Albumin 3.5 - 5.0 g/dL 3.5   AST 15 - 41 U/L 16   ALT 0 - 44 U/L 13   Alkaline Phosphatase 38 - 126 U/L 36Low    Total Bilirubin 0.3 - 1.2 mg/dL 0.6   GFR calc non Af Amer >60 mL/min 58Low    GFR calc Af Amer >60 mL/min >60   Anion gap 5 - 15 8   Comment: Performed at [facility] [address] [place], [place] [address]  Resulting Agency  [facility]      Specimen Collected: 01/24/20 10:57 Last Resulted: 01/24/20 11:20

## 2020-04-22 NOTE — Progress Notes (Signed)
[facility]  [address], [address] [place], [place] [address] Phone: [phone]  Fax: [phone]   Clinic Day:  04/24/2020  Referring physician: Barbaraann Boys, MD  Chief Complaint: Molly Perez is a 74 y.o. 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  HPI: The patient was last seen in the hematology clinic on 01/24/2019. At that time, she was doing well. She denied any B symptoms. Exam was stable. Hematocrit was 40.9, hemoglobin 13.1, platelets 374,000, WBC 8,100.  She continued hydroxyurea 1000 mg4 days/weekand500 mg3 days/week.  She continued a baby aspirin.  During the interim, she has been doing well. She had a right sided nosebleed this morning but it resolved with pressure. She does not usually get nosebleeds. Her eyes have been itchy lately. Her shortness of breath is unchanged. Her knee pain is still present and she is now having some arm pain. Denies nausea, vomiting, and diarrhea. She has never received a phlebotomy.   The patient has fully recovered from her COVID-19 vaccines in March and is no longer experiencing fatigue, fever, or chills. She is not exposed to anybody who smokes. She does not snore but does have trouble sleeping. She would like to get tested for sleep apnea.   Past Medical History:  Diagnosis Date  . Cataract cortical, senile   . GERD (gastroesophageal reflux disease)   . Hypercholesterolemia   . Obesity   . Pre-diabetes   . Prediabetes   . Vitamin D deficiency     Past Surgical History:  Procedure Laterality Date  . ABDOMINAL HYSTERECTOMY    . COLONOSCOPY    . COLONOSCOPY WITH PROPOFOL N/A 07/04/2016   Procedure: COLONOSCOPY WITH PROPOFOL;  Surgeon: Lollie Sails, MD;  Location[facility] ENDOSCOPY;  Service: Endoscopy;  Laterality: N/A;  . ESOPHAGOGASTRODUODENOSCOPY (EGD) WITH PROPOFOL N/A 07/04/2016   Procedure: ESOPHAGOGASTRODUODENOSCOPY (EGD) WITH PROPOFOL;   Surgeon: Lollie Sails, MD;  Location: [facility] ENDOSCOPY;  Service: Endoscopy;  Laterality: N/A;    Family History  Problem Relation Age of Onset  . Alzheimer's disease Father   . Congestive Heart Failure Mother   . Diabetes Mother   . Multiple sclerosis Brother   . Lupus Grandchild   . Stroke Grandchild   . Breast cancer Neg Hx     Social History:  reports that she has never smoked. Her smokeless tobacco use includes snuff. She reports that she does not drink alcohol and does not use drugs. lives in [place]. 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 She retired at age 20. She worked for [employer]. She denies any exposure to radiation, toxins, and smoke. She is concerned about finances and paying for new medications. She is on a fixed income. The patient is alone today.  Allergies:  Allergies  Allergen Reactions  . Amoxicillin Other (See Comments)  . Lipitor [Atorvastatin] Other (See Comments)    Weakness in legs Weakness in legs  . Penicillins Other (See Comments)    Pt states medication upsets her stomach    Current Medications: Current Outpatient Medications  Medication Sig Dispense Refill  . acetaminophen (TYLENOL) 500 MG tablet Take 500 mg by mouth every 8 (eight) hours as needed for moderate pain.     Marland Kitchen aspirin EC 81 MG tablet Take 81 mg by mouth daily.    . Calcium Carbonate-Vitamin D 600-400 MG-UNIT tablet Take 2 tablets by mouth daily.    . Cholecalciferol (VITAMIN D3)  5000 UNITS CAPS Take 2,000 Units by mouth daily.     . hydroxyurea (HYDREA) 500 MG capsule TAKE 2 CAPS MON, WED, FRI, SAT AND TAKE 1 CAPS (500 MG) ON TUE, THURS, SUN. 132 capsule 0  . pantoprazole (PROTONIX) 40 MG tablet Take 40 mg by mouth daily.    . pravastatin (PRAVACHOL) 80 MG tablet Take 80 mg by mouth daily.     . vitamin B-12 (CYANOCOBALAMIN) 1000 MCG tablet Take 1 tablet (1,000 mcg total) by mouth daily. 30 tablet 0  . ondansetron (ZOFRAN) 8 MG tablet  Take 8 mg by mouth every 8 (eight) hours as needed.  (Patient not taking: Reported on 04/24/2020)     No current facility-administered medications for this visit.    Review of Systems  Constitutional: Negative for chills, diaphoresis, fever, malaise/fatigue and weight loss (up 1 lb).       Doing well.  HENT: Positive for nosebleeds (this morning). Negative for congestion, ear pain, sinus pain, sore throat and tinnitus.   Eyes: Negative.  Negative for blurred vision, double vision, photophobia, pain, discharge and redness.       Itchy eyes.  Respiratory: Positive for shortness of breath (exertional; stable). Negative for cough, hemoptysis and sputum production.   Cardiovascular: Negative.  Negative for chest pain, palpitations, orthopnea, leg swelling and PND.  Gastrointestinal: Negative.  Negative for abdominal pain, blood in stool, constipation, diarrhea, heartburn, melena, nausea and vomiting.  Genitourinary: Negative.  Negative for dysuria, frequency, hematuria and urgency.  Musculoskeletal: Positive for joint pain (bilateral knee pain (R>L)). Negative for back pain, falls, myalgias and neck pain.       Scoliosis.  Skin: Negative.  Negative for itching and rash.  Neurological: Negative.  Negative for dizziness, tingling, tremors, sensory change, speech change, focal weakness, weakness and headaches.  Endo/Heme/Allergies: Negative.  Does not bruise/bleed easily.  Psychiatric/Behavioral: Negative for depression and memory loss. The patient has insomnia. The patient is not nervous/anxious.   All other systems reviewed and are negative.  Performance status (ECOG): 1  Vitals Blood pressure (!) 143/85, pulse 63, temperature (!) 97 F (36.1 C), temperature source Tympanic, resp. rate 18, height 5\' 1"  (1.549 m), weight 212 lb 4.9 oz (96.3 kg), SpO2 100 %.   Physical Exam Vitals and nursing note reviewed.  Constitutional:      General: She is not in acute distress.    Appearance: She is  well-developed. She is not diaphoretic.  HENT:     Head: Normocephalic and atraumatic.     Comments: Pearline Cables hair pulled back.  Mask.    Nose:     Comments: Right nostril irritation    Mouth/Throat:     Mouth: Mucous membranes are moist.     Pharynx: Oropharynx is clear. No oropharyngeal exudate.  Eyes:     General: No scleral icterus.    Extraocular Movements: Extraocular movements intact.     Conjunctiva/sclera: Conjunctivae normal.     Pupils: Pupils are equal, round, and reactive to light.     Comments: Glasses.  Brown eyes.  Neck:     Vascular: No JVD.  Cardiovascular:     Rate and Rhythm: Normal rate and regular rhythm.     Heart sounds: No murmur heard.  No gallop.   Pulmonary:     Effort: Pulmonary effort is normal.     Breath sounds: Normal breath sounds. No wheezing or rales.  Chest:     Chest wall: No tenderness.  Abdominal:     General:  Bowel sounds are normal.     Palpations: Abdomen is soft. There is no mass.     Tenderness: There is no abdominal tenderness. There is no guarding or rebound.  Musculoskeletal:        General: No swelling or tenderness. Normal range of motion.     Cervical back: Normal range of motion and neck supple.  Lymphadenopathy:     Head:     Right side of head: No preauricular, posterior auricular or occipital adenopathy.     Left side of head: No preauricular, posterior auricular or occipital adenopathy.     Cervical: No cervical adenopathy.     Upper Body:     Right upper body: No supraclavicular or axillary adenopathy.     Left upper body: No supraclavicular or axillary adenopathy.     Lower Body: No right inguinal adenopathy. No left inguinal adenopathy.  Skin:    General: Skin is warm.     Coloration: Skin is not pale.     Findings: No erythema or rash.  Neurological:     Mental Status: She is alert and oriented to person, place, and time. Mental status is at baseline.  Psychiatric:        Behavior: Behavior normal.         Thought Content: Thought content normal.        Judgment: Judgment normal.    Appointment on 04/24/2020  Component Date Value Ref Range Status  . Sodium 04/24/2020 139  135 - 145 mmol/L Final  . Potassium 04/24/2020 3.7  3.5 - 5.1 mmol/L Final  . Chloride 04/24/2020 107  98 - 111 mmol/L Final  . CO2 04/24/2020 25  22 - 32 mmol/L Final  . Glucose, Bld 04/24/2020 106* 70 - 99 mg/dL Final   Glucose reference range applies only to samples taken after fasting for at least 8 hours.  . BUN 04/24/2020 10  8 - 23 mg/dL Final  . Creatinine, Ser 04/24/2020 0.98  0.44 - 1.00 mg/dL Final  . Calcium 04/24/2020 9.0  8.9 - 10.3 mg/dL Final  . Total Protein 04/24/2020 8.0  6.5 - 8.1 g/dL Final  . Albumin 04/24/2020 3.5  3.5 - 5.0 g/dL Final  . AST 04/24/2020 17  15 - 41 U/L Final  . ALT 04/24/2020 13  0 - 44 U/L Final  . Alkaline Phosphatase 04/24/2020 37* 38 - 126 U/L Final  . Total Bilirubin 04/24/2020 0.4  0.3 - 1.2 mg/dL Final  . GFR calc non Af Amer 04/24/2020 57* >60 mL/min Final  . GFR calc Af Amer 04/24/2020 >60  >60 mL/min Final  . Anion gap 04/24/2020 7  5 - 15 Final   Performed at [facility], [address] [place], [place] [address]  . WBC 04/24/2020 7.2  4.0 - 10.5 K/uL Final  . RBC 04/24/2020 4.02  3.87 - 5.11 MIL/uL Final  . Hemoglobin 04/24/2020 13.8  12.0 - 15.0 g/dL Final  . HCT 04/24/2020 42.2  36 - 46 % Final  . MCV 04/24/2020 105.0* 80.0 - 100.0 fL Final  . MCH 04/24/2020 34.3* 26.0 - 34.0 pg Final  . MCHC 04/24/2020 32.7  30.0 - 36.0 g/dL Final  . RDW 04/24/2020 13.3  11.5 - 15.5 % Final  . Platelets 04/24/2020 368  150 - 400 K/uL Final  . nRBC 04/24/2020 0.0  0.0 - 0.2 % Final  . Neutrophils Relative % 04/24/2020 52  % Final  . Neutro Abs 04/24/2020 3.7  1.7 -  7.7 K/uL Final  . Lymphocytes Relative 04/24/2020 38  % Final  . Lymphs Abs 04/24/2020 2.7  0.7 - 4.0 K/uL Final  . Monocytes Relative 04/24/2020 9  % Final  . Monocytes Absolute 04/24/2020 0.7  0 -  1 K/uL Final  . Eosinophils Relative 04/24/2020 1  % Final  . Eosinophils Absolute 04/24/2020 0.1  0 - 0 K/uL Final  . Basophils Relative 04/24/2020 0  % Final  . Basophils Absolute 04/24/2020 0.0  0 - 0 K/uL Final  . Immature Granulocytes 04/24/2020 0  % Final  . Abs Immature Granulocytes 04/24/2020 0.03  0.00 - 0.07 K/uL Final   Performed at [facility], [address] [place], [place] [address]    Assessment:  Alicen Donalson is a 74 y.o. 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 She has a history of mild leukocytosis, thromobocytosis, and erythrocytosis. Platelet count has ranged between 512,000 - 622,000 since 03/24/2016. WBC has been mildly elevated (10,400 - 10,600). Hematocrit was normal until 09/18/2016. At that time, hematocrit was 50.7 with a hemoglobin of 16.6. She is on ababy aspirin.  Work-up on 11/05/2016 revealed a hematocrit of 43.7, hemoglobin 14.3, platelets 583,00, WBC 9800 with an ANC of 6600. Normal studies included: Ferritin, iron studies, and erythropoietin level. JAK2waspositive for V617F.  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 Marrow was normocellular to mildy hypercellular for age (40-50%) with trilineage hematopoiesis including a proliferation of atypical megakaryocytes with focal clusters and no increase in blasts. There was no significant increase in marrow reticulin fibers. Storage iron was present. Cytogenetics were normal (60, XX). Flow cytometry was negative. FISH studies for MPN/CML were negative.  She is onhydroxyurea (began 12/21/2016). She is taking 1 tablet 4 days/week and 2 tablets 3 days a week (total 11 tablets/week). She denies any side effects.  Left lower extremity duplex on 01/26/2017 revealed no evidence of a DVT.  EGDon 07/04/2016 revealed reflux esophagitis, H pylori  gastritis, and a small hiatal hernia. H pylori was treated with metronidazole, clarithromycin, and omeprazole. Colonoscopyon 07/04/2016 was normal.  Bone densityon 06/05/2017revealed osteopenia with a T-score of -2.1 in the AP spine L2-L4.Bone densityon 04/20/2019 revealed osteopenia with a T-score of -2.0 in the AP spine L1-L3.  Patient received her last COVID-19 vaccine on 01/21/2020.  Symptomatically, she denies any fevers, sweats or weight loss.  She had a nosebleed this morning.  She denies any exposure to smoke.  She is interested in sleep apnea testing.  Exam reveals no adenopathy or hepatosplenomegaly  Plan: 1.   Labs today: CBC with diff, CMP. 2.Essential thrombocytosis Clinically,  she appears to be doing well. Hematocrit 42.2. Hemoglobin 13.8. MCV 105.0. Platelets 368,000. WBC 7200. Platelet goal<=400,000. Continue hydroxyurea 1000 mg 4 days/week and 500 mg 3 days/week.  Continue baby aspirin per day. Discuss hemoglobin creeping up slowly with time.   For patients with PV, hematocrit goal for women is < 42. Patient denies any risk factors for an elevated hemoglobin (smoke exposure, cardiopulmonary disease).  She is interested in sleep apnea testing.  Discuss follow-up with Dr. Janene [place]. 3.RTC in 2 months for labs (CBC with diff, CMP, epo level, carbon monoxide level).  4.   RTC in 4 months for MD assessment and labs (CBC with diff, CMP).   I discussed the assessment and treatment plan with the patient.  The patient was provided an opportunity to ask questions and all were answered.  The  patient agreed with the plan and demonstrated an understanding of the instructions.  The patient was advised to call back if the symptoms worsen or if the condition fails to improve as anticipated.   Lequita Asal, MD, PhD    04/24/2020, 11:46 AM  I, Mirian Mo Tufford, am acting as Education administrator for [employer]. Mike Gip, MD, PhD.  I, Kenaz Olafson C. Mike Gip, MD, have reviewed the  above documentation for accuracy and completeness, and I agree with the above.

## 2020-04-24 ENCOUNTER — Inpatient Hospital Stay: Payer: Medicare HMO

## 2020-04-24 ENCOUNTER — Encounter: Payer: Self-pay | Admitting: Hematology and Oncology

## 2020-04-24 ENCOUNTER — Other Ambulatory Visit: Payer: Self-pay

## 2020-04-24 ENCOUNTER — Inpatient Hospital Stay: Payer: Medicare HMO | Attending: Hematology and Oncology | Admitting: Hematology and Oncology

## 2020-04-24 VITALS — BP 143/85 | HR 63 | Temp 97.0°F | Resp 18 | Ht 61.0 in | Wt 212.3 lb

## 2020-04-24 DIAGNOSIS — K449 Diaphragmatic hernia without obstruction or gangrene: Secondary | ICD-10-CM | POA: Diagnosis not present

## 2020-04-24 DIAGNOSIS — R040 Epistaxis: Secondary | ICD-10-CM | POA: Insufficient documentation

## 2020-04-24 DIAGNOSIS — Z880 Allergy status to penicillin: Secondary | ICD-10-CM | POA: Insufficient documentation

## 2020-04-24 DIAGNOSIS — D473 Essential (hemorrhagic) thrombocythemia: Secondary | ICD-10-CM

## 2020-04-24 DIAGNOSIS — Z823 Family history of stroke: Secondary | ICD-10-CM | POA: Diagnosis not present

## 2020-04-24 DIAGNOSIS — G4730 Sleep apnea, unspecified: Secondary | ICD-10-CM | POA: Insufficient documentation

## 2020-04-24 DIAGNOSIS — Z8719 Personal history of other diseases of the digestive system: Secondary | ICD-10-CM | POA: Insufficient documentation

## 2020-04-24 DIAGNOSIS — M25569 Pain in unspecified knee: Secondary | ICD-10-CM | POA: Diagnosis not present

## 2020-04-24 DIAGNOSIS — Z833 Family history of diabetes mellitus: Secondary | ICD-10-CM | POA: Insufficient documentation

## 2020-04-24 DIAGNOSIS — Z818 Family history of other mental and behavioral disorders: Secondary | ICD-10-CM | POA: Diagnosis not present

## 2020-04-24 DIAGNOSIS — Z8249 Family history of ischemic heart disease and other diseases of the circulatory system: Secondary | ICD-10-CM | POA: Insufficient documentation

## 2020-04-24 DIAGNOSIS — M79603 Pain in arm, unspecified: Secondary | ICD-10-CM | POA: Insufficient documentation

## 2020-04-24 DIAGNOSIS — Z79899 Other long term (current) drug therapy: Secondary | ICD-10-CM | POA: Insufficient documentation

## 2020-04-24 DIAGNOSIS — Z8269 Family history of other diseases of the musculoskeletal system and connective tissue: Secondary | ICD-10-CM | POA: Diagnosis not present

## 2020-04-24 DIAGNOSIS — Z832 Family history of diseases of the blood and blood-forming organs and certain disorders involving the immune mechanism: Secondary | ICD-10-CM | POA: Diagnosis not present

## 2020-04-24 DIAGNOSIS — M8580 Other specified disorders of bone density and structure, unspecified site: Secondary | ICD-10-CM | POA: Diagnosis not present

## 2020-04-24 DIAGNOSIS — R0602 Shortness of breath: Secondary | ICD-10-CM | POA: Insufficient documentation

## 2020-04-24 DIAGNOSIS — M8588 Other specified disorders of bone density and structure, other site: Secondary | ICD-10-CM

## 2020-04-24 LAB — CBC WITH DIFFERENTIAL/PLATELET
Abs Immature Granulocytes: 0.03 10*3/uL (ref 0.00–0.07)
Basophils Absolute: 0 10*3/uL (ref 0.0–0.1)
Basophils Relative: 0 %
Eosinophils Absolute: 0.1 10*3/uL (ref 0.0–0.5)
Eosinophils Relative: 1 %
HCT: 42.2 % (ref 36.0–46.0)
Hemoglobin: 13.8 g/dL (ref 12.0–15.0)
Immature Granulocytes: 0 %
Lymphocytes Relative: 38 %
Lymphs Abs: 2.7 10*3/uL (ref 0.7–4.0)
MCH: 34.3 pg — ABNORMAL HIGH (ref 26.0–34.0)
MCHC: 32.7 g/dL (ref 30.0–36.0)
MCV: 105 fL — ABNORMAL HIGH (ref 80.0–100.0)
Monocytes Absolute: 0.7 10*3/uL (ref 0.1–1.0)
Monocytes Relative: 9 %
Neutro Abs: 3.7 10*3/uL (ref 1.7–7.7)
Neutrophils Relative %: 52 %
Platelets: 368 10*3/uL (ref 150–400)
RBC: 4.02 MIL/uL (ref 3.87–5.11)
RDW: 13.3 % (ref 11.5–15.5)
WBC: 7.2 10*3/uL (ref 4.0–10.5)
nRBC: 0 % (ref 0.0–0.2)

## 2020-04-24 LAB — COMPREHENSIVE METABOLIC PANEL
ALT: 13 U/L (ref 0–44)
AST: 17 U/L (ref 15–41)
Albumin: 3.5 g/dL (ref 3.5–5.0)
Alkaline Phosphatase: 37 U/L — ABNORMAL LOW (ref 38–126)
Anion gap: 7 (ref 5–15)
BUN: 10 mg/dL (ref 8–23)
CO2: 25 mmol/L (ref 22–32)
Calcium: 9 mg/dL (ref 8.9–10.3)
Chloride: 107 mmol/L (ref 98–111)
Creatinine, Ser: 0.98 mg/dL (ref 0.44–1.00)
GFR calc Af Amer: >60 mL/min (ref >60)
GFR calc non Af Amer: 57 mL/min — ABNORMAL LOW (ref >60)
Glucose, Bld: 106 mg/dL — ABNORMAL HIGH (ref 70–99)
Potassium: 3.7 mmol/L (ref 3.5–5.1)
Sodium: 139 mmol/L (ref 135–145)
Total Bilirubin: 0.4 mg/dL (ref 0.3–1.2)
Total Protein: 8 g/dL (ref 6.5–8.1)

## 2020-04-24 NOTE — Progress Notes (Signed)
The patient had a nose bleed today in clinic but it was resolved with no treatment.

## 2020-04-29 ENCOUNTER — Other Ambulatory Visit: Payer: Self-pay

## 2020-04-29 ENCOUNTER — Ambulatory Visit
Admission: EM | Admit: 2020-04-29 | Discharge: 2020-04-29 | Disposition: A | Discharge status: Home / Self Care | Payer: Medicare HMO | Attending: Emergency Medicine | Admitting: Emergency Medicine

## 2020-04-29 ENCOUNTER — Encounter: Payer: Self-pay | Admitting: Emergency Medicine

## 2020-04-29 DIAGNOSIS — R040 Epistaxis: Secondary | ICD-10-CM

## 2020-04-29 HISTORY — DX: Thrombocytopenia, unspecified: D69.6

## 2020-04-29 NOTE — ED Provider Notes (Signed)
[facility] - [place], [place]   Name: Molly Perez DOB: 12-24-1945 MRN: [id number] CSN: [id number] PCP: Barbaraann Boys, MD  Arrival date and time:  04/29/20 1123  Chief Complaint:  Epistaxis   NOTE: Prior to seeing the patient today, I have reviewed the triage nursing documentation and vital signs. Clinical staff has updated patient's PMH/PSHx, current medication list, and drug allergies/intolerances to ensure comprehensive history available to assist in medical decision making.   History:   HPI: Molly Perez is a 74 y.o. female who presents today with complaints of nosebleeds.  Patient is currently seen treatment for thrombocytopenia and is taking hydroxyurea.  This is currently being managed by hematology.  She states her first nosebleed started on the 22nd and she was able to stop the nosebleed with pressure.  She saw her hematologist on that same day and no changes were made to her medication at that time.  Her most recent nosebleed started prior to this visit.  She has noted some scant blood coming from her right nostril only.  She denies any headaches, dizziness, or lightheadedness.  She attempts to blow her nose to stop the bleeding, but that tends to make things worse.  Past Medical History:  Diagnosis Date  . Cataract cortical, senile   . GERD (gastroesophageal reflux disease)   . Hypercholesterolemia   . Obesity   . Pre-diabetes   . Prediabetes   . Thrombocytopenia ([place])   . Vitamin D deficiency     Past Surgical History:  Procedure Laterality Date  . ABDOMINAL HYSTERECTOMY    . COLONOSCOPY    . COLONOSCOPY WITH PROPOFOL N/A 07/04/2016   Procedure: COLONOSCOPY WITH PROPOFOL;  Surgeon: Lollie Sails, MD;  Location: [facility] ENDOSCOPY;  Service: Endoscopy;  Laterality: N/A;  . ESOPHAGOGASTRODUODENOSCOPY (EGD) WITH PROPOFOL N/A 07/04/2016   Procedure: ESOPHAGOGASTRODUODENOSCOPY (EGD) WITH PROPOFOL;  Surgeon: Lollie Sails, MD;  Location: [facility] ENDOSCOPY;   Service: Endoscopy;  Laterality: N/A;    Family History  Problem Relation Age of Onset  . Alzheimer's disease Father   . Congestive Heart Failure Mother   . Diabetes Mother   . Multiple sclerosis Brother   . Lupus Grandchild   . Stroke Grandchild   . Breast cancer Neg Hx     Social History   Tobacco Use  . Smoking status: Never Smoker  . Smokeless tobacco: Current User    Types: Snuff  Vaping Use  . Vaping Use: Never used  Substance Use Topics  . Alcohol use: No    Alcohol/week: 0.0 standard drinks  . Drug use: No    Patient Active Problem List   Diagnosis Date Noted  . Osteopenia of spine 04/26/2019  . Essential thrombocythemia ([place]) 11/26/2016  . 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 Back pain, chronic 05/09/2015  . Hypercholesteremia 05/09/2015  . Obesity, Class II, BMI 35-39.9 05/09/2015  . Prediabetes 05/09/2015  . Avitaminosis D 05/09/2015    Home Medications:    Current Meds  Medication Sig  . acetaminophen (TYLENOL) 500 MG tablet Take 500 mg by mouth every 8 (eight) hours as needed for moderate pain.   Marland Kitchen aspirin EC 81 MG tablet Take 81 mg by mouth daily.  . Calcium Carbonate-Vitamin D 600-400 MG-UNIT tablet Take 2 tablets by mouth daily.  . Cholecalciferol (VITAMIN D3) 5000 UNITS CAPS Take 2,000 Units by mouth daily.   . hydroxyurea (HYDREA) 500 MG capsule TAKE 2 CAPS MON, WED, FRI, SAT AND TAKE  1 CAPS (500 MG) ON TUE, THURS, SUN.  Marland Kitchen pantoprazole (PROTONIX) 40 MG tablet Take 40 mg by mouth daily.  . pravastatin (PRAVACHOL) 80 MG tablet Take 80 mg by mouth daily.   . vitamin B-12 (CYANOCOBALAMIN) 1000 MCG tablet Take 1 tablet (1,000 mcg total) by mouth daily.    Allergies:   Amoxicillin, Lipitor [atorvastatin], and Penicillins  Review of Systems (ROS): Review of Systems  Constitutional: Negative for chills, fatigue and fever.  HENT: Positive for nosebleeds.   Respiratory: Negative for cough and shortness of breath.    Gastrointestinal: Negative for diarrhea and nausea.  All other systems reviewed and are negative.    Vital Signs: Today's Vitals   04/29/20 1154 04/29/20 1155 04/29/20 1249  BP: (!) 155/90    Pulse: 80    Resp: 18    Temp: 97.9 F (36.6 C)    TempSrc: Oral    SpO2: 98%    Weight:  212 lb (96.2 kg)   Height:  5\' 1"  (1.549 m)   PainSc: 0-No pain  0-No pain    Physical Exam: Physical Exam Vitals and nursing note reviewed.  HENT:     Nose: No signs of injury, nasal tenderness or congestion.     Right Nostril: Epistaxis present. No septal hematoma.     Right Sinus: No maxillary sinus tenderness or frontal sinus tenderness.     Left Sinus: No maxillary sinus tenderness or frontal sinus tenderness.     Comments: Small clot appearing area in right nostril, adjacent to septum. Cardiovascular:     Rate and Rhythm: Normal rate and regular rhythm.     Pulses: Normal pulses.     Heart sounds: Normal heart sounds.  Pulmonary:     Breath sounds: Normal breath sounds.  Neurological:     Mental Status: She is alert.      Urgent Care Treatments / Results:   LABS: PLEASE NOTE: all labs that were ordered this encounter are listed, however only abnormal results are displayed. Labs Reviewed - No data to display  EKG: -None  RADIOLOGY: No results found.  PROCEDURES: Procedures  MEDICATIONS RECEIVED THIS VISIT: Medications - No data to display  PERTINENT CLINICAL COURSE NOTES/UPDATES:   Initial Impression / Assessment and Plan / Urgent Care Course:  Pertinent labs & imaging results that were available during my care of the patient were personally reviewed by me and considered in my medical decision making (see lab/imaging section of note for values and interpretations).  [name] is a 74 y.o. female who presents to [facility] today with complaints of nosebleed, diagnosed with the same, and treated as such with the directions below. NP and patient reviewed  discharge instructions below during visit.   Patient is well appearing overall in clinic today. She does not appear to be in any acute distress. Presenting symptoms (see HPI) and exam as documented above.   I have reviewed the follow up and strict return precautions for any new or worsening symptoms. Patient is aware of symptoms that would be deemed urgent/emergent, and would thus require further evaluation either here or in the emergency department. At the time of discharge, she verbalized understanding and consent with the discharge plan as it was reviewed with her. All questions were fielded by provider and/or clinic staff prior to patient discharge.    Final Clinical Impressions / Urgent Care Diagnoses:   Final diagnoses:  Epistaxis    New Prescriptions:  [place] Controlled Substance Registry consulted? Not Applicable  No orders of the defined types were placed in this encounter.     Discharge Instructions     You were seen for nosebleed and are being treated for the same.   Use over-the-counter saline nasal spray to keep your nasal passages moist.  You can use a thin amount of Vaseline after the nosebleed. Do not irritate your nose.  If you start noticing a nosebleed, pinch the bridge of your nose protrusion to your chest. If the nosebleed continues for over 20 minutes, seek further care in the emergency department.  Take care, Dr. Marland Kitchen, NP-c     Recommended Follow up Care:  Patient encouraged to follow up with the following provider within the specified time frame, or sooner as dictated by the severity of her symptoms. As always, she was instructed that for any urgent/emergent care needs, she should seek care either here or in the emergency department for more immediate evaluation.   Gertie Baron, DNP, NP-c    Gertie Baron, NP 04/29/20 [phone]

## 2020-04-29 NOTE — Discharge Instructions (Signed)
You were seen for nosebleed and are being treated for the same.   Use over-the-counter saline nasal spray to keep your nasal passages moist.  You can use a thin amount of Vaseline after the nosebleed. Do not irritate your nose.  If you start noticing a nosebleed, pinch the bridge of your nose protrusion to your chest. If the nosebleed continues for over 20 minutes, seek further care in the emergency department.  Take care, Dr. Marland Kitchen, NP-c

## 2020-04-29 NOTE — ED Triage Notes (Signed)
Patient in today c/o nose bleed that started this morning. Patient is on a blood thinner and took her dose for today just now while in the waiting room. Patient had another nose bleed on 04/24/20. Patient saw her hematologist the same day.

## 2020-04-30 ENCOUNTER — Telehealth: Payer: Self-pay

## 2020-05-01 ENCOUNTER — Telehealth: Payer: Self-pay

## 2020-05-01 NOTE — Telephone Encounter (Signed)
Spoke with the patient to inform her, Per Dr Mike Gip she can use the normal Saline as needed. The patient was understanding and agreeable.

## 2020-05-02 ENCOUNTER — Telehealth: Payer: Self-pay

## 2020-05-02 ENCOUNTER — Telehealth: Payer: Self-pay | Admitting: *Deleted

## 2020-05-02 NOTE — Telephone Encounter (Signed)
Patient called reporting that her nose is bleeding again and she is asking why and would like something done to stop it. Please advise

## 2020-05-02 NOTE — Telephone Encounter (Signed)
Spoke with the patient due to recurrent nose bleeds. She informed me that she had another nose bleed this morning. I spoke with Dr Mike Gip to see could be done to help this patient. The notes was reviewed by MD. The patient was seen in the ED on Sunday due to a nose bleed and she was informed that her nose was dry. The patient was informed to use Normal Saline which she states she picked it up yesterday. The patient also informed me that she does not have air and she need to use a fan constantly to keep cool. I informed the patient that she need to get a humidify to keep the air moisture. I have also informed her per Dr Mike Gip she need to see her PCP, the patient states she has appointment in August. The patient was also informed to call today and get seen in her PCP office as soon as possible due to the nose bleeds. The patient was understanding and agreeable and states she will get in to see her PCP.

## 2020-05-03 ENCOUNTER — Telehealth: Payer: Self-pay

## 2020-05-03 ENCOUNTER — Telehealth: Payer: Self-pay | Admitting: *Deleted

## 2020-05-03 NOTE — Telephone Encounter (Signed)
Spoke with the patient to see if she wanted me to get her a referral to the ENT doctor, per Dr Mike Gip. The patient was ok with that. I spoke with ENT in [place] and got the patient schedule for 10AM they patient is aware and agrees to be at her appointment on time.

## 2020-05-03 NOTE — Telephone Encounter (Signed)
  Can you call patient about her nose bleeds.  M

## 2020-05-03 NOTE — Telephone Encounter (Signed)
Spoke with the patient to see what can i do to help. The patient c/o nose bleed again this morning i have informed the patient to get in touch with her PCP, she states she is going to the ED. I have reached out to the PCP office and spoke with the nurse Melaine which I explained to her that the patient has been having multiple nose bleeds and she is wanting to go to the ED. I have informed her that this patient need to be seen as soon as possilbe. To prevent the patient from going to the ED. She was understanding and agreed to get in touch with this patient and get her seen today by phone or in person. I have also informed the patient. The patient was understanding and agreeable.

## 2020-05-03 NOTE — Telephone Encounter (Signed)
Patient called reporting that she has another nose bleed and is requesting a return call

## 2020-06-05 ENCOUNTER — Other Ambulatory Visit: Payer: Self-pay | Admitting: Hematology and Oncology

## 2020-06-05 DIAGNOSIS — D473 Essential (hemorrhagic) thrombocythemia: Secondary | ICD-10-CM

## 2020-06-05 NOTE — Telephone Encounter (Signed)
CBC with Differential/Platelet Order: [id number] Status:  Final result Visible to patient:  No (inaccessible in MyChart) Next appt:  06/19/2020 at 10:15 AM in Oncology ([facility]) Dx:  Essential thrombocythemia ([place]); Oste...  0 Result Notes  Ref Range & Units 1 mo ago 4 mo ago 7 mo ago 10 mo ago  WBC 4.0 - 10.5 K/uL 7.2  8.1  7.4  6.7   RBC 3.87 - 5.11 MIL/uL 4.02  3.84Low  3.81Low  3.87   Hemoglobin 12.0 - 15.0 g/dL 13.8  13.1  13.0  13.0   HCT 36 - 46 % 42.2  40.9  39.9  40.3   MCV 80.0 - 100.0 fL 105.0High  106.5High  104.7High  104.1High   MCH 26.0 - 34.0 pg 34.3High  34.1High  34.1High  33.6   MCHC 30.0 - 36.0 g/dL 32.7  32.0  32.6  32.3   RDW 11.5 - 15.5 % 13.3  13.1  13.9  13.5   Platelets 150 - 400 K/uL 368  374  363  321   nRBC 0.0 - 0.2 % 0.0  0.0  0.0  0.0   Neutrophils Relative % % 52  57  54  46   Neutro Abs 1.7 - 7.7 K/uL 3.7  4.6  4.1  3.1   Lymphocytes Relative % 38  33  34  42   Lymphs Abs 0.7 - 4.0 K/uL 2.7  2.7  2.5  2.8   Monocytes Relative % 9  8  10  10    Monocytes Absolute 0 - 1 K/uL 0.7  0.6  0.7  0.7   Eosinophils Relative % 1  1  1  1    Eosinophils Absolute 0 - 0 K/uL 0.1  0.1  0.1  0.0   Basophils Relative % 0  1  1  1    Basophils Absolute 0 - 0 K/uL 0.0  0.0  0.0  0.0   Immature Granulocytes % 0  0  0  0   Abs Immature Granulocytes 0.00 - 0.07 K/uL 0.03  0.03 CM  0.03 CM  0.01 CM   Comment: Performed at [facility], [address], [place], [place] [address]  Resulting Agency  [facility] CLIN LAB [place] [facility] LAB [place] [facility] LAB      Specimen Collected: 04/24/20 09:04 Last Resulted: 04/24/20 09:12     Lab Flowsheet   Order Details   View Encounter   Lab and Collection Details   Routing   Result History     CM=Additional comments    Result Care Coordination  Patient Communication  Add Comments Not seen Back to Top      Other Results from 04/24/2020  Contains abnormal dataComprehensive metabolic  panel  Status:  Final result Visible to patient:  No (inaccessible in MyChart) Next appt:  06/19/2020 at 10:15 AM in Oncology ([facility]) Dx:  Essential thrombocythemia ([place]); Oste... Order: [id number]  0 Result Notes  Ref Range & Units 1 mo ago 4 mo ago 7 mo ago 10 mo ago  Sodium 135 - 145 mmol/L 139  134Low  136  135   Potassium 3.5 - 5.1 mmol/L 3.7  3.6  3.8  3.5   Chloride 98 - 111 mmol/L 107  101  104  102   CO2 22 - 32 mmol/L 25  25  24  25    Glucose, Bld 70 - 99 mg/dL 106High  97 CM  84  90   Comment: Glucose  reference range applies only to samples taken after fasting for at least 8 hours.  BUN 8 - 23 mg/dL 10  12  8  10    Creatinine, Ser 0.44 - 1.00 mg/dL 0.98  0.96  0.91  0.96   Calcium 8.9 - 10.3 mg/dL 9.0  9.1  8.9  8.8Low   Total Protein 6.5 - 8.1 g/dL 8.0  7.3  7.7  7.8   Albumin 3.5 - 5.0 g/dL 3.5  3.5  3.5  3.5   AST 15 - 41 U/L 17  16  17  18    ALT 0 - 44 U/L 13  13  13  14    Alkaline Phosphatase 38 - 126 U/L 37Low  36Low  37Low  40   Total Bilirubin 0.3 - 1.2 mg/dL 0.4  0.6  0.5  0.5   GFR calc non Af Amer >60 mL/min 57Low  58Low  >60  58Low   GFR calc Af Amer >60 mL/min >60  >60  >60  >60   Anion gap 5 - 15 7  8  CM  8 CM  8 CM   Comment: Performed at [facility], [address], [place], [place] [address]  Resulting Agency  [facility] CL[facility] LAB [facility] CLIN LAB [facility] CLIN LAB [facility] CLIN LAB      Specimen Collected: 04/24/20 09:04 Last Resulted: 04/24/20 09:27

## 2020-06-11 IMAGING — MG MM DIGITAL DIAGNOSTIC UNILAT*R* W/ TOMO W/ CAD
6 series · 6 of 18 positions shown · non-contrast
Comparison: Previous exam(s).

CLINICAL DATA: Possible distortion in the upper right breast,
posteriorly, in the oblique projection of a recent screening
mammogram.

EXAM:
DIGITAL DIAGNOSTIC UNILATERAL RIGHT MAMMOGRAM WITH CAD AND TOMO

[R MLO synth-2D (1 of 2)]
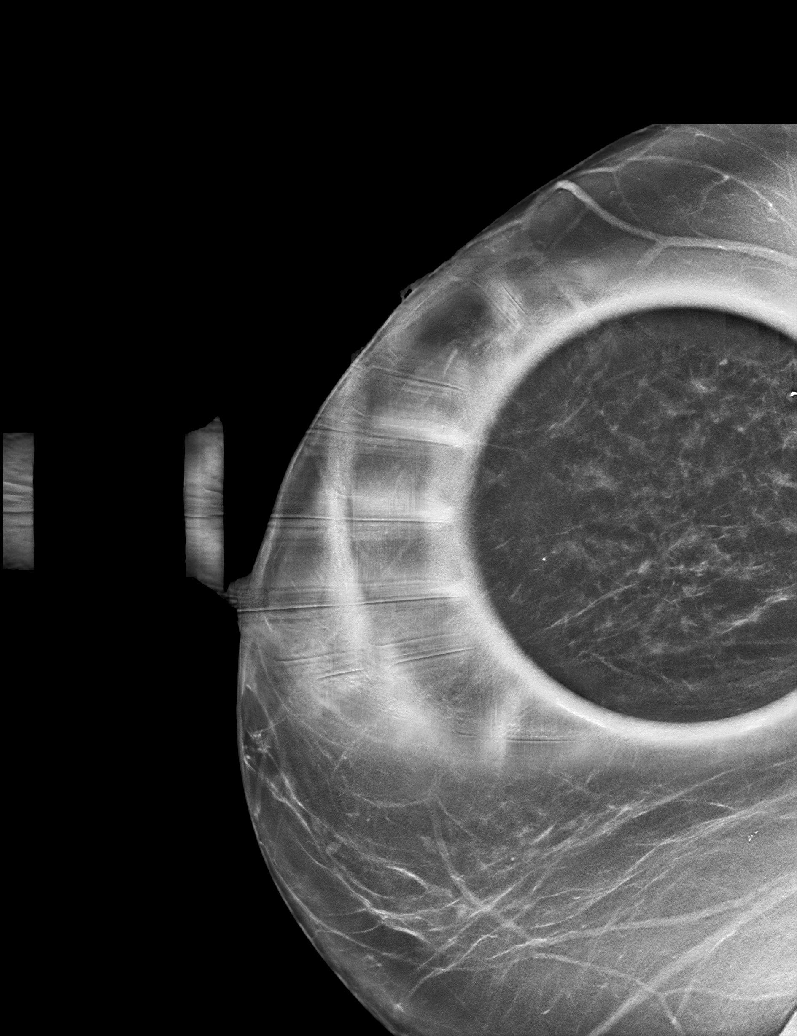

[R ML synth-2D]
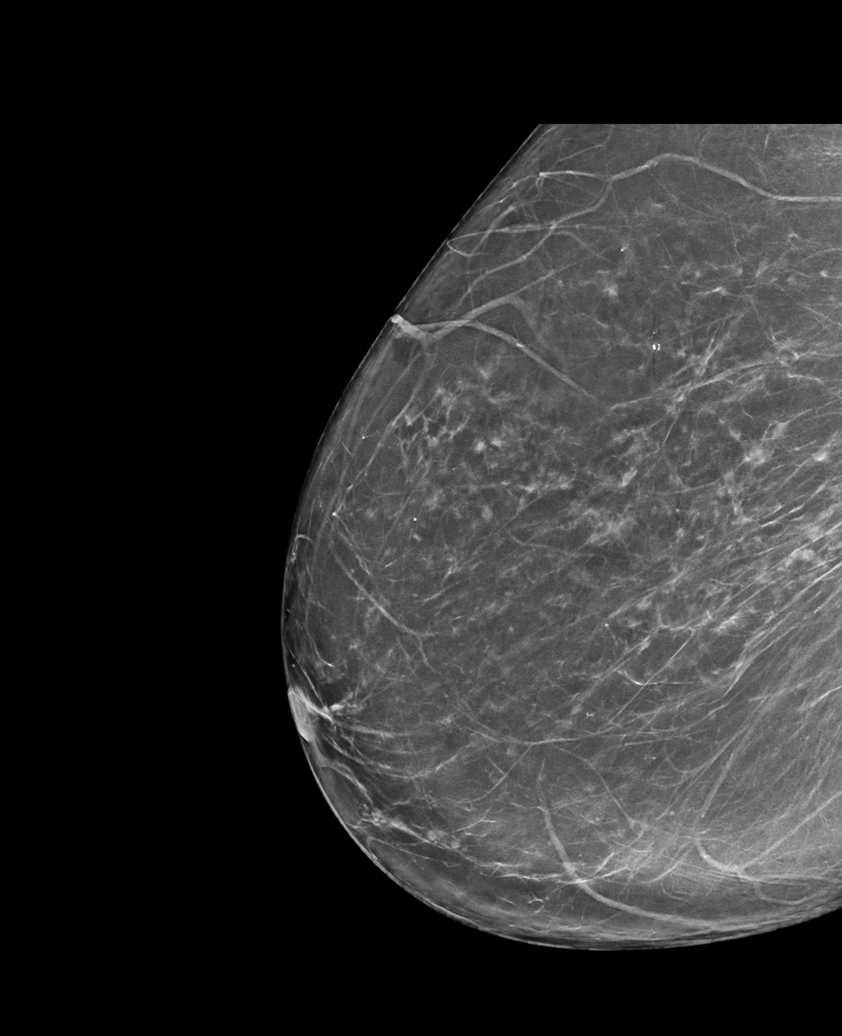

[R MLO synth-2D (2 of 2)]
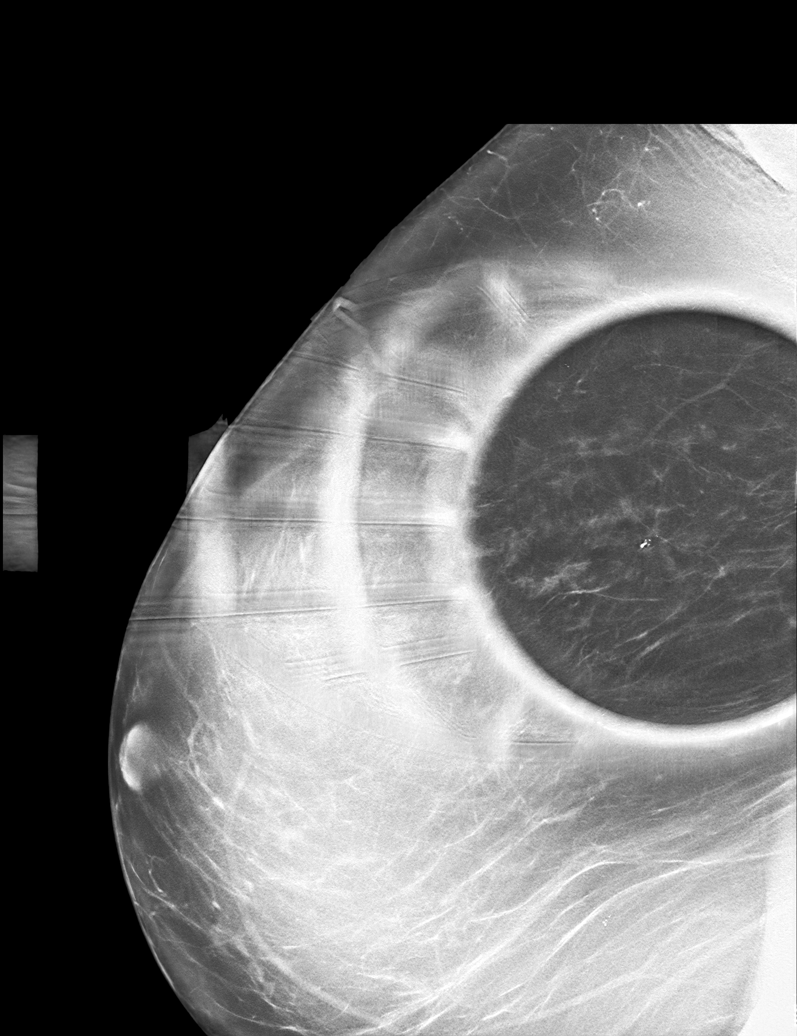

[R MLO tomo (1 of 2) · tomo slice 35/69.0]
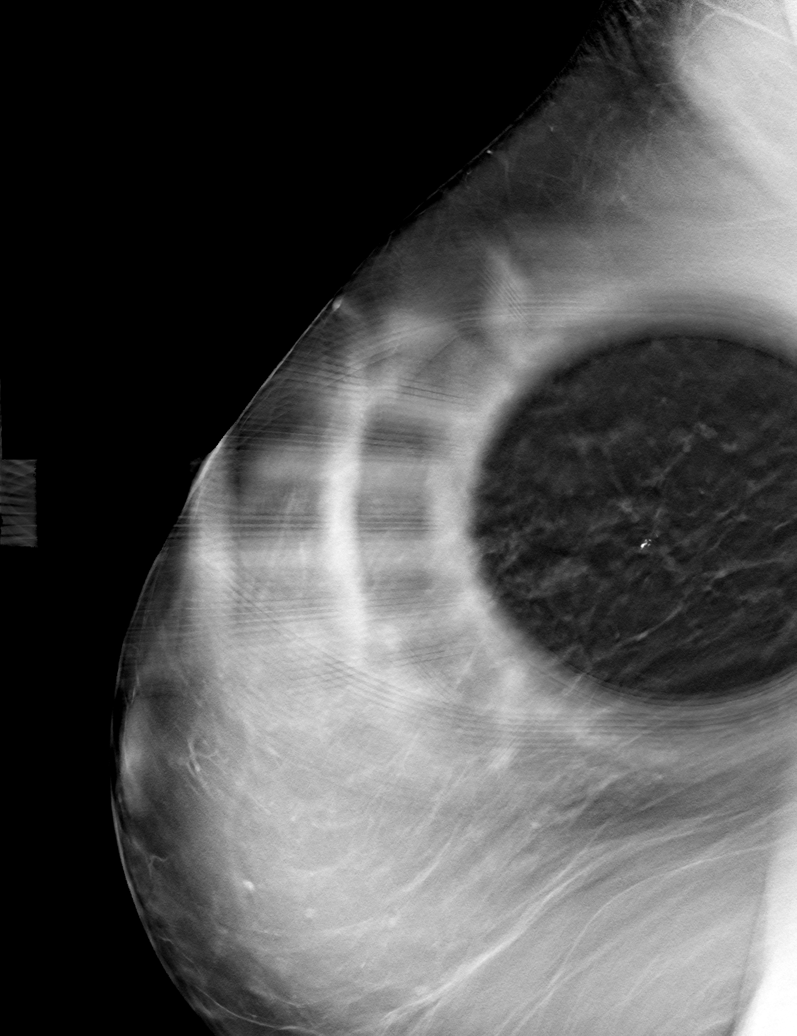

[R MLO tomo (2 of 2) · tomo slice 36/71.0]
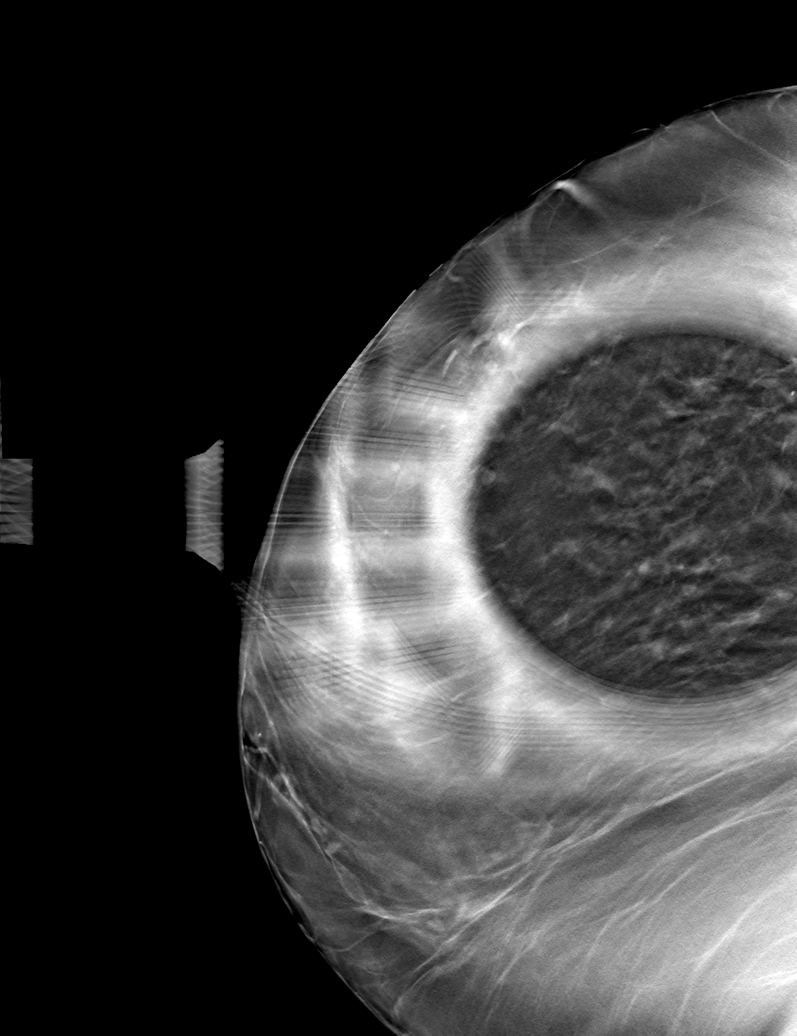

[R ML tomo · tomo slice 39/76.0]
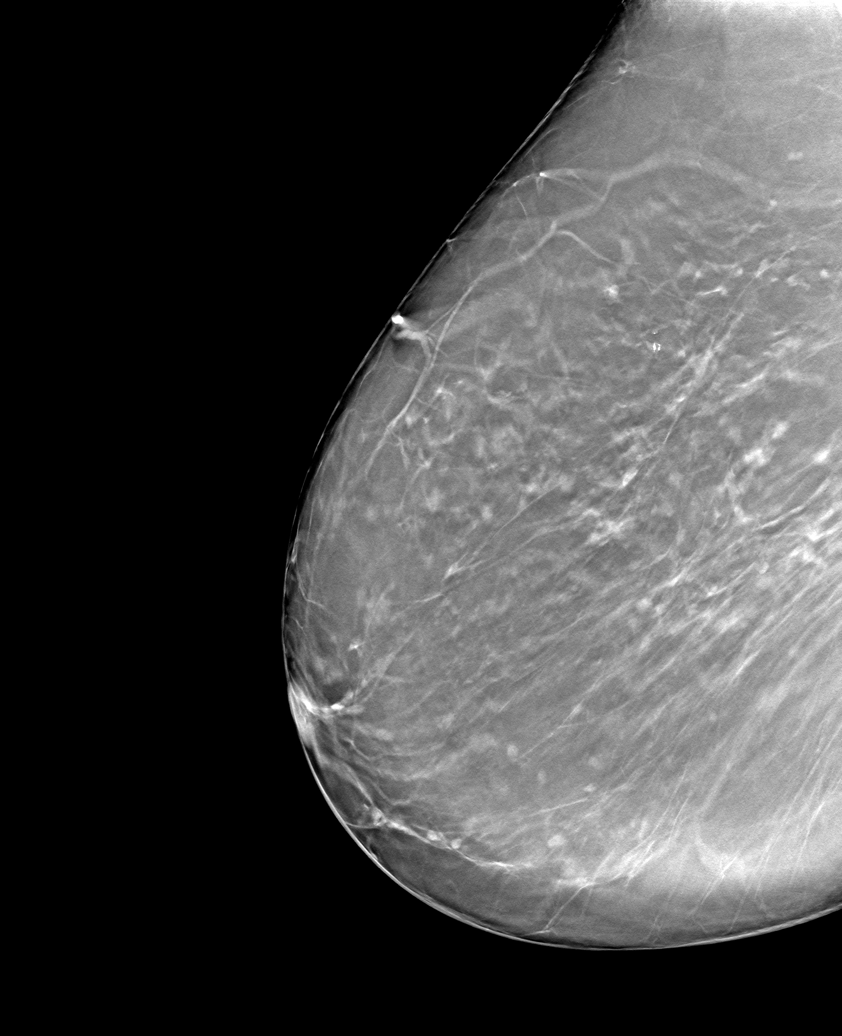

[6 of 18 positions shown; findings below may reference images not displayed]

ACR Breast Density Category b: There are scattered areas of
fibroglandular density.
FINDINGS: 3D tomographic and 2D generated true lateral and spot compression
oblique views of the right breast were obtained. These demonstrate
normal appearing fibroglandular tissue at the location of recently
suspected distortion, unchanged compared to previous examinations.

Mammographic images were processed with CAD.
IMPRESSION: No evidence of malignancy. The recently suspected right breast
distortion was close apposition of normal breast tissue.

RECOMMENDATION:
Bilateral screening mammogram in 1 year.

I have discussed the findings and recommendations with the patient.
If applicable, a reminder letter will be sent to the patient
regarding the next appointment.

BI-RADS CATEGORY  1: Negative.

## 2020-06-19 ENCOUNTER — Inpatient Hospital Stay: Payer: Medicare HMO | Attending: Hematology and Oncology

## 2020-06-19 ENCOUNTER — Other Ambulatory Visit: Payer: Self-pay

## 2020-06-19 DIAGNOSIS — D473 Essential (hemorrhagic) thrombocythemia: Secondary | ICD-10-CM | POA: Diagnosis not present

## 2020-06-19 LAB — CBC WITH DIFFERENTIAL/PLATELET
Abs Immature Granulocytes: 0.02 10*3/uL (ref 0.00–0.07)
Basophils Absolute: 0 10*3/uL (ref 0.0–0.1)
Basophils Relative: 1 %
Eosinophils Absolute: 0.1 10*3/uL (ref 0.0–0.5)
Eosinophils Relative: 1 %
HCT: 39.3 % (ref 36.0–46.0)
Hemoglobin: 12.9 g/dL (ref 12.0–15.0)
Immature Granulocytes: 0 %
Lymphocytes Relative: 29 %
Lymphs Abs: 1.9 10*3/uL (ref 0.7–4.0)
MCH: 35 pg — ABNORMAL HIGH (ref 26.0–34.0)
MCHC: 32.8 g/dL (ref 30.0–36.0)
MCV: 106.5 fL — ABNORMAL HIGH (ref 80.0–100.0)
Monocytes Absolute: 0.7 10*3/uL (ref 0.1–1.0)
Monocytes Relative: 11 %
Neutro Abs: 3.8 10*3/uL (ref 1.7–7.7)
Neutrophils Relative %: 58 %
Platelets: 349 10*3/uL (ref 150–400)
RBC: 3.69 MIL/uL — ABNORMAL LOW (ref 3.87–5.11)
RDW: 13.2 % (ref 11.5–15.5)
WBC: 6.6 10*3/uL (ref 4.0–10.5)
nRBC: 0 % (ref 0.0–0.2)

## 2020-06-19 LAB — COMPREHENSIVE METABOLIC PANEL
ALT: 14 U/L (ref 0–44)
AST: 15 U/L (ref 15–41)
Albumin: 3.4 g/dL — ABNORMAL LOW (ref 3.5–5.0)
Alkaline Phosphatase: 31 U/L — ABNORMAL LOW (ref 38–126)
Anion gap: 7 (ref 5–15)
BUN: 11 mg/dL (ref 8–23)
CO2: 26 mmol/L (ref 22–32)
Calcium: 9.1 mg/dL (ref 8.9–10.3)
Chloride: 104 mmol/L (ref 98–111)
Creatinine, Ser: 1.03 mg/dL — ABNORMAL HIGH (ref 0.44–1.00)
GFR calc Af Amer: >60 mL/min (ref >60)
GFR calc non Af Amer: 53 mL/min — ABNORMAL LOW (ref >60)
Glucose, Bld: 86 mg/dL (ref 70–99)
Potassium: 4 mmol/L (ref 3.5–5.1)
Sodium: 137 mmol/L (ref 135–145)
Total Bilirubin: 0.3 mg/dL (ref 0.3–1.2)
Total Protein: 7.5 g/dL (ref 6.5–8.1)

## 2020-06-20 LAB — ERYTHROPOIETIN: Erythropoietin: 18.5 m[IU]/mL (ref 2.6–18.5)

## 2020-06-20 LAB — CARBON MONOXIDE, BLOOD (PERFORMED AT REF LAB): Carbon Monoxide, Blood: 2.3 % (ref 0.0–3.6)

## 2020-07-31 ENCOUNTER — Other Ambulatory Visit: Payer: Self-pay | Admitting: Pediatrics

## 2020-07-31 DIAGNOSIS — Z1231 Encounter for screening mammogram for malignant neoplasm of breast: Secondary | ICD-10-CM

## 2020-08-20 NOTE — Progress Notes (Signed)
[facility]  [address] [place], [place] [address] Phone: [phone]  Fax: [phone]   Clinic Day:  08/21/2020  Referring physician: Barbaraann Boys, MD  Chief Complaint: Molly Perez is a 74 y.o. 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  HPI: The patient was last seen in the hematology clinic on 04/24/2020. At that time, she denied any fevers, sweats or weight loss.  She had a nosebleed that morning.  She denied any exposure to smoke.  She was interested in sleep apnea testing.  Exam revealed no adenopathy or hepatosplenomegaly. She continued baby aspirin and hydroxyurea 1000 mg 4 days/week and 500 mg 3 days/week.  Labs at last visit revealed a hematocrit of 42.2, hemoglobin 13.8, platelets 368,000, WBC 7,200.  Creatinine was 0.98 (CrCl 57 ml/min).  The patient was seen in the ER on 04/29/2020 for another nosebleed. She had a spot in her nose "burned." She was to start using OTC saline nasal spray to keep her nasal passages moist.  Labs on 06/19/2020 revealed a hematocrit of 39.3, hemoglobin 12.9, MCV 106.5, platelets 349,000, WBC 6,600 with an ANC of 3800. Creatinine was 1.03 (CrCl 53 ml/min). Albumin was 3.4.  Erythropoietin was 18.5 (normal). Carbon monoxide was 2.3%.  She is scheduled for a mammogram on 08/22/2020.  During the interim, she has been "pretty good." She denies fever, sweats, weight loss, lumps or bumps, and bleeding of any kind. She has a runny nose. She bruises easily on her arms. She is still short of breath on exertion and has knee pain. Her sleep is back to normal. She has a new pain in her left hip/buttock that she feels when she raises her leg or leans back.  The patient has not undergone sleep apnea testing.   Past Medical History:  Diagnosis Date  . Cataract cortical, senile   . GERD (gastroesophageal reflux disease)   . Hypercholesterolemia   . Obesity   .  Pre-diabetes   . Prediabetes   . Thrombocytopenia ([place])   . Vitamin D deficiency     Past Surgical History:  Procedure Laterality Date  . ABDOMINAL HYSTERECTOMY    . COLONOSCOPY    . COLONOSCOPY WITH PROPOFOL N/A 07/04/2016   Procedure: COLONOSCOPY WITH PROPOFOL;  Surgeon: Lollie Sails, MD;  Location: [facility] ENDOSCOPY;  Service: Endoscopy;  Laterality: N/A;  . ESOPHAGOGASTRODUODENOSCOPY (EGD) WITH PROPOFOL N/A 07/04/2016   Procedure: ESOPHAGOGASTRODUODENOSCOPY (EGD) WITH PROPOFOL;  Surgeon: Lollie Sails, MD;  Location: [facility] ENDOSCOPY;  Service: Endoscopy;  Laterality: N/A;    Family History  Problem Relation Age of Onset  . Alzheimer's disease Father   . Congestive Heart Failure Mother   . Diabetes Mother   . Multiple sclerosis Brother   . Lupus Grandchild   . Stroke Grandchild   . Breast cancer Neg Hx     Social History:  reports that she has never smoked. Her smokeless tobacco use includes snuff. She reports that she does not drink alcohol and does not use drugs. lives in [place]. 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 She retired at age 59. She worked for [employer]. She denies any exposure to radiation, toxins, and smoke. She is concerned about finances and paying for new medications. She is on a fixed income. The patient is alone today.  Allergies:  Allergies  Allergen Reactions  . Amoxicillin Other (See Comments)  . Lipitor [Atorvastatin] Other (See Comments)  Weakness in legs Weakness in legs  . Penicillins Other (See Comments)    Pt states medication upsets her stomach    Current Medications: Current Outpatient Medications  Medication Sig Dispense Refill  . acetaminophen (TYLENOL) 500 MG tablet Take 500 mg by mouth every 8 (eight) hours as needed for moderate pain.     Marland Kitchen aspirin EC 81 MG tablet Take 81 mg by mouth daily.    . Calcium Carbonate-Vitamin D 600-400 MG-UNIT tablet Take 2 tablets by mouth daily.    .  Cholecalciferol (VITAMIN D3) 5000 UNITS CAPS Take 2,000 Units by mouth daily.     . hydroxyurea (HYDREA) 500 MG capsule TAKE 2 CAPS MON, WED, FRI, SAT AND TAKE 1 CAPS (500 MG) ON TUE, THURS, SUN. 132 capsule 0  . pantoprazole (PROTONIX) 40 MG tablet Take 40 mg by mouth daily.    . pravastatin (PRAVACHOL) 80 MG tablet Take 80 mg by mouth daily.     . vitamin B-12 (CYANOCOBALAMIN) 1000 MCG tablet Take 1 tablet (1,000 mcg total) by mouth daily. 30 tablet 0  . ondansetron (ZOFRAN) 8 MG tablet Take 8 mg by mouth every 8 (eight) hours as needed.  (Patient not taking: Reported on 04/24/2020)     No current facility-administered medications for this visit.    Review of Systems  Constitutional: Negative.  Negative for chills, diaphoresis, fever, malaise/fatigue and weight loss (up 3 lbs).       Feels "pretty good."  HENT: Negative for congestion, ear discharge, ear pain, hearing loss, nosebleeds, sinus pain, sore throat and tinnitus.        Runny nose.  Eyes: Negative.  Negative for blurred vision, double vision, photophobia, pain, discharge and redness.  Respiratory: Positive for shortness of breath (on exertion). Negative for cough, hemoptysis and sputum production.   Cardiovascular: Negative.  Negative for chest pain, palpitations, orthopnea, leg swelling and PND.  Gastrointestinal: Negative.  Negative for abdominal pain, blood in stool, constipation, diarrhea, heartburn, melena, nausea and vomiting.  Genitourinary: Negative.  Negative for dysuria, frequency, hematuria and urgency.  Musculoskeletal: Positive for joint pain (bilateral knee pain (R>L)). Negative for back pain, falls, myalgias and neck pain.       Scoliosis. Left hip/buttock pain.  Skin: Negative.  Negative for itching and rash.  Neurological: Negative.  Negative for dizziness, tingling, tremors, sensory change, speech change, focal weakness, weakness and headaches.  Endo/Heme/Allergies: Negative.  Does not bruise/bleed easily.   Psychiatric/Behavioral: Negative for depression and memory loss. The patient is not nervous/anxious and does not have insomnia.   All other systems reviewed and are negative.  Performance status (ECOG): 1  Vitals Blood pressure 140/70, pulse 72, temperature 97.6 F (36.4 C), temperature source Tympanic, resp. rate 18, height 5\' 1"  (1.549 m), weight 215 lb 5.2 oz (97.7 kg), SpO2 99 %.   Physical Exam Vitals and nursing note reviewed.  Constitutional:      General: She is not in acute distress.    Appearance: She is well-developed. She is not diaphoretic.  HENT:     Head: Normocephalic and atraumatic.     Comments: Long gray hair.  Mask.    Mouth/Throat:     Mouth: Mucous membranes are moist.     Pharynx: Oropharynx is clear. No oropharyngeal exudate.  Eyes:     General: No scleral icterus.    Extraocular Movements: Extraocular movements intact.     Conjunctiva/sclera: Conjunctivae normal.     Pupils: Pupils are equal, round, and reactive to light.  Comments: Red rimmed glasses.  Brown eyes.  Neck:     Vascular: No JVD.  Cardiovascular:     Rate and Rhythm: Normal rate and regular rhythm.     Heart sounds: No murmur heard.  No gallop.   Pulmonary:     Effort: Pulmonary effort is normal.     Breath sounds: Normal breath sounds. No wheezing or rales.  Chest:     Chest wall: No tenderness.  Abdominal:     General: Bowel sounds are normal. There is no distension.     Palpations: Abdomen is soft. There is no mass.     Tenderness: There is no abdominal tenderness. There is no guarding or rebound.  Musculoskeletal:        General: No swelling or tenderness. Normal range of motion.     Cervical back: Normal range of motion and neck supple.  Lymphadenopathy:     Head:     Right side of head: No preauricular, posterior auricular or occipital adenopathy.     Left side of head: No preauricular, posterior auricular or occipital adenopathy.     Cervical: No cervical adenopathy.      Upper Body:     Right upper body: No supraclavicular or axillary adenopathy.     Left upper body: No supraclavicular or axillary adenopathy.     Lower Body: No right inguinal adenopathy. No left inguinal adenopathy.  Skin:    General: Skin is warm.     Coloration: Skin is not pale.     Findings: No erythema or rash.  Neurological:     Mental Status: She is alert and oriented to person, place, and time. Mental status is at baseline.  Psychiatric:        Behavior: Behavior normal.        Thought Content: Thought content normal.        Judgment: Judgment normal.    Appointment on 08/21/2020  Component Date Value Ref Range Status  . Sodium 08/21/2020 136  135 - 145 mmol/L Final  . Potassium 08/21/2020 3.4* 3.5 - 5.1 mmol/L Final  . Chloride 08/21/2020 101  98 - 111 mmol/L Final  . CO2 08/21/2020 29  22 - 32 mmol/L Final  . Glucose, Bld 08/21/2020 109* 70 - 99 mg/dL Final   Glucose reference range applies only to samples taken after fasting for at least 8 hours.  . BUN 08/21/2020 11  8 - 23 mg/dL Final  . Creatinine, Ser 08/21/2020 1.01* 0.44 - 1.00 mg/dL Final  . Calcium 08/21/2020 8.8* 8.9 - 10.3 mg/dL Final  . Total Protein 08/21/2020 7.9  6.5 - 8.1 g/dL Final  . Albumin 08/21/2020 3.5  3.5 - 5.0 g/dL Final  . AST 08/21/2020 18  15 - 41 U/L Final  . ALT 08/21/2020 14  0 - 44 U/L Final  . Alkaline Phosphatase 08/21/2020 34* 38 - 126 U/L Final  . Total Bilirubin 08/21/2020 0.3  0.3 - 1.2 mg/dL Final  . GFR, Estimated 08/21/2020 54* >60 mL/min Final  . Anion gap 08/21/2020 6  5 - 15 Final   Performed at [facility], [address] [place], [place] [address]  . WBC 08/21/2020 6.8  4.0 - 10.5 K/uL Final  . RBC 08/21/2020 3.85* 3.87 - 5.11 MIL/uL Final  . Hemoglobin 08/21/2020 13.4  12.0 - 15.0 g/dL Final  . HCT 08/21/2020 41.1  36 - 46 % Final  . MCV 08/21/2020 106.8* 80.0 - 100.0 fL Final  . MCH  08/21/2020 34.8* 26.0 - 34.0 pg Final  . MCHC 08/21/2020 32.6   30.0 - 36.0 g/dL Final  . RDW 08/21/2020 13.2  11.5 - 15.5 % Final  . Platelets 08/21/2020 370  150 - 400 K/uL Final  . nRBC 08/21/2020 0.0  0.0 - 0.2 % Final  . Neutrophils Relative % 08/21/2020 56  % Final  . Neutro Abs 08/21/2020 3.9  1.7 - 7.7 K/uL Final  . Lymphocytes Relative 08/21/2020 34  % Final  . Lymphs Abs 08/21/2020 2.3  0.7 - 4.0 K/uL Final  . Monocytes Relative 08/21/2020 8  % Final  . Monocytes Absolute 08/21/2020 0.6  0.1 - 1.0 K/uL Final  . Eosinophils Relative 08/21/2020 1  % Final  . Eosinophils Absolute 08/21/2020 0.0  0.0 - 0.5 K/uL Final  . Basophils Relative 08/21/2020 1  % Final  . Basophils Absolute 08/21/2020 0.0  0.0 - 0.1 K/uL Final  . Immature Granulocytes 08/21/2020 0  % Final  . Abs Immature Granulocytes 08/21/2020 0.02  0.00 - 0.07 K/uL Final   Performed at [facility], [address] [place], [place] [address]    Assessment:  Verleen Stuckey is a 74 y.o. 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 She has a history of mild leukocytosis, thromobocytosis, and erythrocytosis. Platelet count has ranged between 512,000 - 622,000 since 03/24/2016. WBC has been mildly elevated (10,400 - 10,600). Hematocrit was normal until 09/18/2016. At that time, hematocrit was 50.7 with a hemoglobin of 16.6. She is on ababy aspirin.  Work-up on 11/05/2016 revealed a hematocrit of 43.7, hemoglobin 14.3, platelets 583,00, WBC 9800 with an ANC of 6600. Normal studies included: Ferritin, iron studies, and erythropoietin level. JAK2waspositive for V617F.  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 Marrow was normocellular to mildy hypercellular for age (40-50%) with trilineage hematopoiesis including a proliferation of atypical megakaryocytes with focal clusters and no increase in blasts. There was no significant increase in  marrow reticulin fibers. Storage iron was present. Cytogenetics were normal (12, XX). Flow cytometry was negative. FISH studies for MPN/CML were negative.  She is onhydroxyurea (began 12/21/2016). She is taking 1 tablet 4 days/week and 2 tablets 3 days a week (total 11 tablets/week). She denies any side effects.  Left lower extremity duplex on 01/26/2017 revealed no evidence of a DVT.  EGDon 07/04/2016 revealed reflux esophagitis, H pylori gastritis, and a small hiatal hernia. H pylori was treated with metronidazole, clarithromycin, and omeprazole. Colonoscopyon 07/04/2016 was normal.  Bone densityon 06/05/2017revealed osteopenia with a T-score of -2.1 in the AP spine L2-L4.Bone densityon 04/20/2019 revealed osteopenia with a T-score of -2.0 in the AP spine L1-L3.  Patient received her last COVID-19 vaccine on 01/21/2020.  Symptomatically, she feels "pretty good." She denies B symptoms.  She denies bleeding.  Exam is stable.  Plan: 1.   Labs today: CBC with diff, CMP 2.Essential thrombocytosis Clinically,  she is doing well. Hematocrit  41.1. Hemoglobin  13.4. MCV 106.8. Platelets 370,000. WBC 6,800. Platelet goal<=400,000. Continue hydroxyurea 1000 mg 4 days/week and 500 mg 3 days/week.  Continue baby aspirin 1/day. Maintain hematocrit < 42. Patient denies any risk factors for an elevated hemoglobin (smoke exposure, cardiopulmonary disease).  Erythropoietin level and carbon monoxide level were normal.  Follow-up with Dr. Janene Harvey regarding sleep apnea testing. 3.   Handicapped parking permit for [employer]. 4.   RTC in 3 months for MD assessment and labs (CBC with diff,  CMP).   I discussed the assessment and treatment plan with the patient.  The patient was provided an opportunity to ask questions and all were answered.  The patient agreed with the plan and demonstrated an understanding of the instructions.  The patient was advised to call back if the  symptoms worsen or if the condition fails to improve as anticipated.   Lequita Asal, MD, PhD 08/21/2020, 11:00 AM  I, Mirian Mo Tufford, am acting as Education administrator for [employer]. Mike Gip, MD, PhD.  I, Dewanna Hurston C. Mike Gip, MD, have reviewed the above documentation for accuracy and completeness, and I agree with the above.

## 2020-08-21 ENCOUNTER — Other Ambulatory Visit: Payer: Self-pay

## 2020-08-21 ENCOUNTER — Encounter: Payer: Self-pay | Admitting: Hematology and Oncology

## 2020-08-21 ENCOUNTER — Inpatient Hospital Stay: Payer: Medicare HMO | Attending: Hematology and Oncology | Admitting: Hematology and Oncology

## 2020-08-21 ENCOUNTER — Inpatient Hospital Stay: Payer: Medicare HMO

## 2020-08-21 VITALS — BP 140/70 | HR 72 | Temp 97.6°F | Resp 18 | Ht 61.0 in | Wt 215.3 lb

## 2020-08-21 DIAGNOSIS — R040 Epistaxis: Secondary | ICD-10-CM | POA: Insufficient documentation

## 2020-08-21 DIAGNOSIS — Z818 Family history of other mental and behavioral disorders: Secondary | ICD-10-CM | POA: Insufficient documentation

## 2020-08-21 DIAGNOSIS — M8580 Other specified disorders of bone density and structure, unspecified site: Secondary | ICD-10-CM | POA: Diagnosis not present

## 2020-08-21 DIAGNOSIS — R0602 Shortness of breath: Secondary | ICD-10-CM | POA: Insufficient documentation

## 2020-08-21 DIAGNOSIS — D473 Essential (hemorrhagic) thrombocythemia: Secondary | ICD-10-CM | POA: Diagnosis present

## 2020-08-21 DIAGNOSIS — Z833 Family history of diabetes mellitus: Secondary | ICD-10-CM | POA: Insufficient documentation

## 2020-08-21 DIAGNOSIS — Z8249 Family history of ischemic heart disease and other diseases of the circulatory system: Secondary | ICD-10-CM | POA: Diagnosis not present

## 2020-08-21 DIAGNOSIS — Z880 Allergy status to penicillin: Secondary | ICD-10-CM | POA: Diagnosis not present

## 2020-08-21 DIAGNOSIS — M25552 Pain in left hip: Secondary | ICD-10-CM | POA: Diagnosis not present

## 2020-08-21 DIAGNOSIS — Z79899 Other long term (current) drug therapy: Secondary | ICD-10-CM | POA: Insufficient documentation

## 2020-08-21 DIAGNOSIS — Z832 Family history of diseases of the blood and blood-forming organs and certain disorders involving the immune mechanism: Secondary | ICD-10-CM | POA: Insufficient documentation

## 2020-08-21 DIAGNOSIS — Z1589 Genetic susceptibility to other disease: Secondary | ICD-10-CM | POA: Diagnosis not present

## 2020-08-21 DIAGNOSIS — G4730 Sleep apnea, unspecified: Secondary | ICD-10-CM | POA: Insufficient documentation

## 2020-08-21 DIAGNOSIS — Z8719 Personal history of other diseases of the digestive system: Secondary | ICD-10-CM | POA: Insufficient documentation

## 2020-08-21 DIAGNOSIS — M25569 Pain in unspecified knee: Secondary | ICD-10-CM | POA: Diagnosis not present

## 2020-08-21 DIAGNOSIS — Z823 Family history of stroke: Secondary | ICD-10-CM | POA: Diagnosis not present

## 2020-08-21 DIAGNOSIS — Z8269 Family history of other diseases of the musculoskeletal system and connective tissue: Secondary | ICD-10-CM | POA: Diagnosis not present

## 2020-08-21 LAB — CBC WITH DIFFERENTIAL/PLATELET
Abs Immature Granulocytes: 0.02 10*3/uL (ref 0.00–0.07)
Basophils Absolute: 0 10*3/uL (ref 0.0–0.1)
Basophils Relative: 1 %
Eosinophils Absolute: 0 10*3/uL (ref 0.0–0.5)
Eosinophils Relative: 1 %
HCT: 41.1 % (ref 36.0–46.0)
Hemoglobin: 13.4 g/dL (ref 12.0–15.0)
Immature Granulocytes: 0 %
Lymphocytes Relative: 34 %
Lymphs Abs: 2.3 10*3/uL (ref 0.7–4.0)
MCH: 34.8 pg — ABNORMAL HIGH (ref 26.0–34.0)
MCHC: 32.6 g/dL (ref 30.0–36.0)
MCV: 106.8 fL — ABNORMAL HIGH (ref 80.0–100.0)
Monocytes Absolute: 0.6 10*3/uL (ref 0.1–1.0)
Monocytes Relative: 8 %
Neutro Abs: 3.9 10*3/uL (ref 1.7–7.7)
Neutrophils Relative %: 56 %
Platelets: 370 10*3/uL (ref 150–400)
RBC: 3.85 MIL/uL — ABNORMAL LOW (ref 3.87–5.11)
RDW: 13.2 % (ref 11.5–15.5)
WBC: 6.8 10*3/uL (ref 4.0–10.5)
nRBC: 0 % (ref 0.0–0.2)

## 2020-08-21 LAB — COMPREHENSIVE METABOLIC PANEL
ALT: 14 U/L (ref 0–44)
AST: 18 U/L (ref 15–41)
Albumin: 3.5 g/dL (ref 3.5–5.0)
Alkaline Phosphatase: 34 U/L — ABNORMAL LOW (ref 38–126)
Anion gap: 6 (ref 5–15)
BUN: 11 mg/dL (ref 8–23)
CO2: 29 mmol/L (ref 22–32)
Calcium: 8.8 mg/dL — ABNORMAL LOW (ref 8.9–10.3)
Chloride: 101 mmol/L (ref 98–111)
Creatinine, Ser: 1.01 mg/dL — ABNORMAL HIGH (ref 0.44–1.00)
GFR, Estimated: 54 mL/min — ABNORMAL LOW (ref >60)
Glucose, Bld: 109 mg/dL — ABNORMAL HIGH (ref 70–99)
Potassium: 3.4 mmol/L — ABNORMAL LOW (ref 3.5–5.1)
Sodium: 136 mmol/L (ref 135–145)
Total Bilirubin: 0.3 mg/dL (ref 0.3–1.2)
Total Protein: 7.9 g/dL (ref 6.5–8.1)

## 2020-08-21 NOTE — Progress Notes (Signed)
No new changes noted today 

## 2020-08-22 ENCOUNTER — Ambulatory Visit
Admission: RE | Admit: 2020-08-22 | Discharge: 2020-08-22 | Disposition: A | Discharge status: Home / Self Care | Payer: Medicare HMO | Source: Ambulatory Visit | Attending: Pediatrics | Admitting: Pediatrics

## 2020-08-22 DIAGNOSIS — Z1231 Encounter for screening mammogram for malignant neoplasm of breast: Secondary | ICD-10-CM | POA: Diagnosis not present

## 2020-08-28 ENCOUNTER — Other Ambulatory Visit: Payer: Self-pay | Admitting: Hematology and Oncology

## 2020-08-28 DIAGNOSIS — D473 Essential (hemorrhagic) thrombocythemia: Secondary | ICD-10-CM

## 2020-11-18 ENCOUNTER — Other Ambulatory Visit: Payer: Self-pay | Admitting: Hematology and Oncology

## 2020-11-18 DIAGNOSIS — D473 Essential (hemorrhagic) thrombocythemia: Secondary | ICD-10-CM

## 2020-11-19 ENCOUNTER — Telehealth: Payer: Self-pay | Admitting: Hematology and Oncology

## 2020-11-19 NOTE — Progress Notes (Incomplete)
[facility]  [address] [place], [place] [address] Phone: [phone]  Fax: [phone]   Clinic Day:  11/19/2020  Referring physician: Barbaraann Boys, MD  Chief Complaint: Molly Perez is a 75 y.o. 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  HPI: The patient was last seen in the hematology clinic on 08/21/2020. At that time, she felt "pretty good." She denies B symptoms.  She denied bleeding.  Exam was stable. Hematocrit was 41.1, hemoglobin 13.4, platelets 370,000, WBC 6,800. Potassium was 3.4. Creatinine was 1.01 (CrCl 54 ml/min). Calcium was 8.8. Alkaline phosphatase was 34. She continued hydroxyurea 1000 mg 4 days/week and 500 mg 3 days/week. She continued baby aspirin daily.  Screening mammogram on 08/22/2020 revealed no evidence of malignancy.  During the interim, ***   Past Medical History:  Diagnosis Date  . Cataract cortical, senile   . GERD (gastroesophageal reflux disease)   . Hypercholesterolemia   . Obesity   . Pre-diabetes   . Prediabetes   . Thrombocytopenia ([place])   . Vitamin D deficiency     Past Surgical History:  Procedure Laterality Date  . ABDOMINAL HYSTERECTOMY    . COLONOSCOPY    . COLONOSCOPY WITH PROPOFOL N/A 07/04/2016   Procedure: COLONOSCOPY WITH PROPOFOL;  Surgeon: Lollie Sails, MD;  Location: [facility] ENDOSCOPY;  Service: Endoscopy;  Laterality: N/A;  . ESOPHAGOGASTRODUODENOSCOPY (EGD) WITH PROPOFOL N/A 07/04/2016   Procedure: ESOPHAGOGASTRODUODENOSCOPY (EGD) WITH PROPOFOL;  Surgeon: Lollie Sails, MD;  Location: [facility] ENDOSCOPY;  Service: Endoscopy;  Laterality: N/A;    Family History  Problem Relation Age of Onset  . Alzheimer's disease Father   . Congestive Heart Failure Mother   . Diabetes Mother   . Multiple sclerosis Brother   . Lupus Grandchild   . Stroke Grandchild   . Breast cancer Neg Hx     Social History:  reports that she has never  smoked. Her smokeless tobacco use includes snuff. She reports that she does not drink alcohol and does not use drugs. lives in [place]. 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 She retired at age 61. She worked for [employer]. She denies any exposure to radiation, toxins, and smoke. She is concerned about finances and paying for new medications. She is on a fixed income. The patient is alone*** today.  Allergies:  Allergies  Allergen Reactions  . Amoxicillin Other (See Comments)  . Lipitor [Atorvastatin] Other (See Comments)    Weakness in legs Weakness in legs  . Penicillins Other (See Comments)    Pt states medication upsets her stomach    Current Medications: Current Outpatient Medications  Medication Sig Dispense Refill  . acetaminophen (TYLENOL) 500 MG tablet Take 500 mg by mouth every 8 (eight) hours as needed for moderate pain.     Marland Kitchen aspirin EC 81 MG tablet Take 81 mg by mouth daily.    . Calcium Carbonate-Vitamin D 600-400 MG-UNIT tablet Take 2 tablets by mouth daily.    . Cholecalciferol (VITAMIN D3) 5000 UNITS CAPS Take 2,000 Units by mouth daily.     . hydroxyurea (HYDREA) 500 MG capsule TAKE 2 CAPS MON, WED, FRI, SAT AND TAKE 1 CAPS (500 MG) ON TUE, THURS, SUN. 132 capsule 0  . ondansetron (ZOFRAN) 8 MG tablet Take 8 mg by mouth every 8 (eight) hours as needed.  (Patient not taking: Reported on 04/24/2020)    . pantoprazole (PROTONIX) 40 MG  tablet Take 40 mg by mouth daily.    . pravastatin (PRAVACHOL) 80 MG tablet Take 80 mg by mouth daily.     . vitamin B-12 (CYANOCOBALAMIN) 1000 MCG tablet Take 1 tablet (1,000 mcg total) by mouth daily. 30 tablet 0   No current facility-administered medications for this visit.    Review of Systems  Constitutional: Negative.  Negative for chills, diaphoresis, fever, malaise/fatigue and weight loss (up 3 lbs).       Feels "pretty good."  HENT: Negative for congestion, ear discharge, ear pain,  hearing loss, nosebleeds, sinus pain, sore throat and tinnitus.        Runny nose.  Eyes: Negative.  Negative for blurred vision, double vision, photophobia, pain, discharge and redness.  Respiratory: Positive for shortness of breath (on exertion). Negative for cough, hemoptysis and sputum production.   Cardiovascular: Negative.  Negative for chest pain, palpitations, orthopnea, leg swelling and PND.  Gastrointestinal: Negative.  Negative for abdominal pain, blood in stool, constipation, diarrhea, heartburn, melena, nausea and vomiting.  Genitourinary: Negative.  Negative for dysuria, frequency, hematuria and urgency.  Musculoskeletal: Positive for joint pain (bilateral knee pain (R>L)). Negative for back pain, falls, myalgias and neck pain.       Scoliosis. Left hip/buttock pain.  Skin: Negative.  Negative for itching and rash.  Neurological: Negative.  Negative for dizziness, tingling, tremors, sensory change, speech change, focal weakness, weakness and headaches.  Endo/Heme/Allergies: Negative.  Does not bruise/bleed easily.  Psychiatric/Behavioral: Negative for depression and memory loss. The patient is not nervous/anxious and does not have insomnia.   All other systems reviewed and are negative.  Performance status (ECOG): 1***  Vitals There were no vitals taken for this visit.   Physical Exam Vitals and nursing note reviewed.  Constitutional:      General: She is not in acute distress.    Appearance: She is well-developed. She is not diaphoretic.  HENT:     Head: Normocephalic and atraumatic.     Comments: Long gray hair.  Mask.    Mouth/Throat:     Mouth: Mucous membranes are moist.     Pharynx: Oropharynx is clear. No oropharyngeal exudate.  Eyes:     General: No scleral icterus.    Extraocular Movements: Extraocular movements intact.     Conjunctiva/sclera: Conjunctivae normal.     Pupils: Pupils are equal, round, and reactive to light.     Comments: Red rimmed glasses.   Brown eyes.  Neck:     Vascular: No JVD.  Cardiovascular:     Rate and Rhythm: Normal rate and regular rhythm.     Heart sounds: No murmur heard. No gallop.   Pulmonary:     Effort: Pulmonary effort is normal.     Breath sounds: Normal breath sounds. No wheezing or rales.  Chest:     Chest wall: No tenderness.  Breasts:     Right: No axillary adenopathy or supraclavicular adenopathy.     Left: No axillary adenopathy or supraclavicular adenopathy.    Abdominal:     General: Bowel sounds are normal. There is no distension.     Palpations: Abdomen is soft. There is no mass.     Tenderness: There is no abdominal tenderness. There is no guarding or rebound.  Musculoskeletal:        General: No swelling or tenderness. Normal range of motion.     Cervical back: Normal range of motion and neck supple.  Lymphadenopathy:     Head:  Right side of head: No preauricular, posterior auricular or occipital adenopathy.     Left side of head: No preauricular, posterior auricular or occipital adenopathy.     Cervical: No cervical adenopathy.     Upper Body:     Right upper body: No supraclavicular or axillary adenopathy.     Left upper body: No supraclavicular or axillary adenopathy.     Lower Body: No right inguinal adenopathy. No left inguinal adenopathy.  Skin:    General: Skin is warm.     Coloration: Skin is not pale.     Findings: No erythema or rash.  Neurological:     Mental Status: She is alert and oriented to person, place, and time. Mental status is at baseline.  Psychiatric:        Behavior: Behavior normal.        Thought Content: Thought content normal.        Judgment: Judgment normal.    No visits with results within 3 Day(s) from this visit.  Latest known visit with results is:  Appointment on 08/21/2020  Component Date Value Ref Range Status  . Sodium 08/21/2020 136  135 - 145 mmol/L Final  . Potassium 08/21/2020 3.4* 3.5 - 5.1 mmol/L Final  . Chloride 08/21/2020  101  98 - 111 mmol/L Final  . CO2 08/21/2020 29  22 - 32 mmol/L Final  . Glucose, Bld 08/21/2020 109* 70 - 99 mg/dL Final   Glucose reference range applies only to samples taken after fasting for at least 8 hours.  . BUN 08/21/2020 11  8 - 23 mg/dL Final  . Creatinine, Ser 08/21/2020 1.01* 0.44 - 1.00 mg/dL Final  . Calcium 08/21/2020 8.8* 8.9 - 10.3 mg/dL Final  . Total Protein 08/21/2020 7.9  6.5 - 8.1 g/dL Final  . Albumin 08/21/2020 3.5  3.5 - 5.0 g/dL Final  . AST 08/21/2020 18  15 - 41 U/L Final  . ALT 08/21/2020 14  0 - 44 U/L Final  . Alkaline Phosphatase 08/21/2020 34* 38 - 126 U/L Final  . Total Bilirubin 08/21/2020 0.3  0.3 - 1.2 mg/dL Final  . GFR, Estimated 08/21/2020 54* >60 mL/min Final  . Anion gap 08/21/2020 6  5 - 15 Final   Performed at [facility], [address], [place], [place] [address]  . WBC 08/21/2020 6.8  4.0 - 10.5 K/uL Final  . RBC 08/21/2020 3.85* 3.87 - 5.11 MIL/uL Final  . Hemoglobin 08/21/2020 13.4  12.0 - 15.0 g/dL Final  . HCT 08/21/2020 41.1  36.0 - 46.0 % Final  . MCV 08/21/2020 106.8* 80.0 - 100.0 fL Final  . MCH 08/21/2020 34.8* 26.0 - 34.0 pg Final  . MCHC 08/21/2020 32.6  30.0 - 36.0 g/dL Final  . RDW 08/21/2020 13.2  11.5 - 15.5 % Final  . Platelets 08/21/2020 370  150 - 400 K/uL Final  . nRBC 08/21/2020 0.0  0.0 - 0.2 % Final  . Neutrophils Relative % 08/21/2020 56  % Final  . Neutro Abs 08/21/2020 3.9  1.7 - 7.7 K/uL Final  . Lymphocytes Relative 08/21/2020 34  % Final  . Lymphs Abs 08/21/2020 2.3  0.7 - 4.0 K/uL Final  . Monocytes Relative 08/21/2020 8  % Final  . Monocytes Absolute 08/21/2020 0.6  0.1 - 1.0 K/uL Final  . Eosinophils Relative 08/21/2020 1  % Final  . Eosinophils Absolute 08/21/2020 0.0  0.0 - 0.5 K/uL Final  . Basophils Relative 08/21/2020 1  %  Final  . Basophils Absolute 08/21/2020 0.0  0.0 - 0.1 K/uL Final  . Immature Granulocytes 08/21/2020 0  % Final  . Abs Immature Granulocytes 08/21/2020 0.02  0.00  - 0.07 K/uL Final   Performed at [facility], [address], [place], [place] [address]    Assessment:  Molly Perez is a 75 y.o. 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 She has a history of mild leukocytosis, thromobocytosis, and erythrocytosis. Platelet count has ranged between 512,000 - 622,000 since 03/24/2016. WBC has been mildly elevated (10,400 - 10,600). Hematocrit was normal until 09/18/2016. At that time, hematocrit was 50.7 with a hemoglobin of 16.6. She is on ababy aspirin.  Work-up on 11/05/2016 revealed a hematocrit of 43.7, hemoglobin 14.3, platelets 583,00, WBC 9800 with an ANC of 6600. Normal studies included: Ferritin, iron studies, and erythropoietin level. JAK2waspositive for V617F.  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 Marrow was normocellular to mildy hypercellular for age (40-50%) with trilineage hematopoiesis including a proliferation of atypical megakaryocytes with focal clusters and no increase in blasts. There was no significant increase in marrow reticulin fibers. Storage iron was present. Cytogenetics were normal (89, XX). Flow cytometry was negative. FISH studies for MPN/CML were negative.  She is onhydroxyurea (began 12/21/2016). She is taking 1 tablet 4 days/week and 2 tablets 3 days a week (total 11 tablets/week). She denies any side effects.  Left lower extremity duplex on 01/26/2017 revealed no evidence of a DVT.  EGDon 07/04/2016 revealed reflux esophagitis, H pylori gastritis, and a small hiatal hernia. H pylori was treated with metronidazole, clarithromycin, and omeprazole. Colonoscopyon 07/04/2016 was normal.  Bone densityon 06/05/2017revealed osteopenia with a T-score of -2.1 in the AP spine L2-L4.Bone densityon 04/20/2019 revealed osteopenia with a T-score of -2.0 in the AP  spine L1-L3.  Patient received her last COVID-19 vaccine on 01/21/2020.  Symptomatically, ***  Plan: 1.   Labs today: CBC with diff, CMP   2.Essential thrombocytosis Clinically,  she is doing well. Hematocrit  41.1. Hemoglobin  13.4. MCV 106.8. Platelets 370,000. WBC 6,800. Platelet goal<=400,000. Continue hydroxyurea 1000 mg 4 days/week and 500 mg 3 days/week.  Continue baby aspirin 1/day. Maintain hematocrit < 42. Patient denies any risk factors for an elevated hemoglobin (smoke exposure, cardiopulmonary disease).  Erythropoietin level and carbon monoxide level were normal.  Follow-up with Dr. Janene Harvey regarding sleep apnea testing. 3.   Handicapped parking permit for [facility]. 4.   RTC in 3 months for MD assessment and labs (CBC with diff, CMP).   I discussed the assessment and treatment plan with the patient.  The patient was provided an opportunity to ask questions and all were answered.  The patient agreed with the plan and demonstrated an understanding of the instructions.  The patient was advised to call back if the symptoms worsen or if the condition fails to improve as anticipated.  I provided *** minutes of face-to-face time during this this encounter and > 50% was spent counseling as documented under my assessment and plan.  Lequita Asal, MD, PhD 11/19/2020, 12:27 PM  I, Mirian Mo Tufford, am acting as Education administrator for [employer]. Mike Gip, MD, PhD.  I, Melissa C. Mike Gip, MD, have reviewed the above documentation for accuracy and completeness, and I agree with the above.

## 2020-11-19 NOTE — Telephone Encounter (Signed)
11/19/2020 Called pt and informed her that appts on 11/20/20 have been moved to 12/10/20 due to inclement weather. Pt confirmed these changes  SRW

## 2020-11-20 ENCOUNTER — Inpatient Hospital Stay: Payer: Medicare HMO

## 2020-11-20 ENCOUNTER — Inpatient Hospital Stay: Payer: Medicare HMO | Attending: Hematology and Oncology | Admitting: Hematology and Oncology

## 2020-12-10 ENCOUNTER — Inpatient Hospital Stay: Payer: Medicare HMO

## 2020-12-10 ENCOUNTER — Inpatient Hospital Stay: Payer: Medicare HMO | Admitting: Hematology and Oncology

## 2020-12-19 ENCOUNTER — Encounter: Payer: Self-pay | Admitting: Hematology and Oncology

## 2020-12-19 NOTE — Progress Notes (Signed)
[facility]  [address], [address] [place], [place] [address] Phone: [phone]  Fax: [phone]   Clinic Day:  12/20/2020  Referring physician: Barbaraann Boys, MD  Chief Complaint: Molly Perez is a 75 y.o. 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  HPI: The patient was last seen in the hematology clinic on 08/21/2020. At that time, she felt "pretty good." She denies B symptoms.  She denied bleeding.  Exam was stable. Hematocrit was 41.1, hemoglobin 13.4, platelets 370,000, WBC 6,800. Potassium was 3.4. Creatinine was 1.01 (CrCl 54 ml/min). Calcium was 8.8. She continued hydroxyurea 1000 mg 4 days/week and 500 mg 3 days/week. She continued baby aspirin daily.  Screening bilateral mammogram on 08/22/2020 revealed no evidence of malignancy.   The patient saw Stephens November, NP on 09/03/2020. She continued pantoprazole 40 mg.  They discussed decreasing the dose because the patient rarely ever had breakthrough symptoms; patient wanted to stay on current dose. They discussed switching from calcium carbonate to calcium citrate for better absorption.  During the interim, she has been "good." She cut back on her meat intake. She still has shortness of breath on exertion. Her right knee pain has improved. She has lower back pain when she moves around. Her left hip and buttock pain have resolved. She denies infections, lumps, bumps, bruising, and bleeding. She has a scratch on her right hip.  She is on the same dose of hydroxyurea. She takes vitamin B12 500 mcg daily.   Past Medical History:  Diagnosis Date  . Cataract cortical, senile   . GERD (gastroesophageal reflux disease)   . Hypercholesterolemia   . Obesity   . Pre-diabetes   . Prediabetes   . Thrombocytopenia ([place])   . Vitamin D deficiency     Past Surgical History:  Procedure Laterality Date  . ABDOMINAL HYSTERECTOMY    . COLONOSCOPY    .  COLONOSCOPY WITH PROPOFOL N/A 07/04/2016   Procedure: COLONOSCOPY WITH PROPOFOL;  Surgeon: Lollie Sails, MD;  Location: [facility] ENDOSCOPY;  Service: Endoscopy;  Laterality: N/A;  . ESOPHAGOGASTRODUODENOSCOPY (EGD) WITH PROPOFOL N/A 07/04/2016   Procedure: ESOPHAGOGASTRODUODENOSCOPY (EGD) WITH PROPOFOL;  Surgeon: Lollie Sails, MD;  Location: [facility] ENDOSCOPY;  Service: Endoscopy;  Laterality: N/A;    Family History  Problem Relation Age of Onset  . Alzheimer's disease Father   . Congestive Heart Failure Mother   . Diabetes Mother   . Multiple sclerosis Brother   . Lupus Grandchild   . Stroke Grandchild   . Breast cancer Neg Hx     Social History:  reports that she has never smoked. Her smokeless tobacco use includes snuff. She reports that she does not drink alcohol and does not use drugs. lives in [place]. 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 She retired at age 24. She worked for [employer]. She denies any exposure to radiation, toxins, and smoke. She is concerned about finances and paying for new medications. She is on a fixed income. The patient is alone today.  Allergies:  Allergies  Allergen Reactions  . Amoxicillin Other (See Comments)  . Lipitor [Atorvastatin] Other (See Comments)    Weakness in legs Weakness in legs  . Penicillins Other (See Comments)    Pt states medication upsets her stomach    Current Medications: Current Outpatient Medications  Medication Sig Dispense Refill  . acetaminophen (TYLENOL) 500 MG tablet Take 500 mg by mouth every 8 (  eight) hours as needed for moderate pain.     Marland Kitchen aspirin EC 81 MG tablet Take 81 mg by mouth daily.    . Calcium Carbonate-Vitamin D 600-400 MG-UNIT tablet Take 2 tablets by mouth daily.    . Cholecalciferol (VITAMIN D3) 5000 UNITS CAPS Take 2,000 Units by mouth daily.     . hydroxyurea (HYDREA) 500 MG capsule TAKE 2 CAPS MON, WED, FRI, SAT AND TAKE 1 CAPS (500 MG) ON TUE, THURS,  SUN. 132 capsule 0  . pantoprazole (PROTONIX) 40 MG tablet Take 40 mg by mouth daily.    . pravastatin (PRAVACHOL) 80 MG tablet Take 80 mg by mouth daily.     . vitamin B-12 (CYANOCOBALAMIN) 1000 MCG tablet Take 1 tablet (1,000 mcg total) by mouth daily. 30 tablet 0  . ondansetron (ZOFRAN) 8 MG tablet Take 8 mg by mouth every 8 (eight) hours as needed.  (Patient not taking: No sig reported)     No current facility-administered medications for this visit.    Review of Systems  Constitutional: Positive for weight loss (2 lbs). Negative for chills, diaphoresis, fever and malaise/fatigue.       Feels "good."  HENT: Negative.  Negative for congestion, ear discharge, ear pain, hearing loss, nosebleeds, sinus pain, sore throat and tinnitus.   Eyes: Negative.  Negative for blurred vision, double vision, photophobia, pain, discharge and redness.  Respiratory: Positive for shortness of breath (on exertion, stable). Negative for cough, hemoptysis and sputum production.   Cardiovascular: Negative.  Negative for chest pain, palpitations, orthopnea, leg swelling and PND.  Gastrointestinal: Negative.  Negative for abdominal pain, blood in stool, constipation, diarrhea, heartburn, melena, nausea and vomiting.  Genitourinary: Negative.  Negative for dysuria, frequency, hematuria and urgency.  Musculoskeletal: Positive for back pain (lower back) and joint pain (right knee pain, improved). Negative for falls, myalgias and neck pain.       Scoliosis.  Skin: Negative.  Negative for itching and rash.       Scratch on right hip.  Neurological: Negative.  Negative for dizziness, tingling, tremors, sensory change, speech change, focal weakness, weakness and headaches.  Endo/Heme/Allergies: Negative.  Does not bruise/bleed easily.  Psychiatric/Behavioral: Negative.  Negative for depression and memory loss. The patient is not nervous/anxious and does not have insomnia.   All other systems reviewed and are  negative.  Performance status (ECOG): 1  Vitals Blood pressure 136/77, pulse 71, temperature 98.5 F (36.9 C), temperature source Oral, weight 213 lb 13.5 oz (97 kg), SpO2 98 %.   Physical Exam Vitals and nursing note reviewed.  Constitutional:      General: She is not in acute distress.    Appearance: She is well-developed. She is not diaphoretic.  HENT:     Head: Normocephalic and atraumatic.     Comments: Long gray hair pulled back.  Mask.    Mouth/Throat:     Mouth: Mucous membranes are moist.     Pharynx: Oropharynx is clear. No oropharyngeal exudate.  Eyes:     General: No scleral icterus.    Extraocular Movements: Extraocular movements intact.     Conjunctiva/sclera: Conjunctivae normal.     Pupils: Pupils are equal, round, and reactive to light.     Comments: Red rimmed glasses.  Brown eyes.  Neck:     Vascular: No JVD.  Cardiovascular:     Rate and Rhythm: Normal rate and regular rhythm.     Heart sounds: No murmur heard. No gallop.   Pulmonary:  Effort: Pulmonary effort is normal.     Breath sounds: Normal breath sounds. No wheezing or rales.  Chest:     Chest wall: No tenderness.  Breasts:     Right: No axillary adenopathy or supraclavicular adenopathy.     Left: No axillary adenopathy or supraclavicular adenopathy.    Abdominal:     General: Bowel sounds are normal. There is no distension.     Palpations: Abdomen is soft. There is no mass.     Tenderness: There is no abdominal tenderness. There is no guarding or rebound.  Musculoskeletal:        General: No swelling or tenderness. Normal range of motion.     Cervical back: Normal range of motion and neck supple.  Lymphadenopathy:     Head:     Right side of head: No preauricular, posterior auricular or occipital adenopathy.     Left side of head: No preauricular, posterior auricular or occipital adenopathy.     Cervical: No cervical adenopathy.     Upper Body:     Right upper body: No  supraclavicular or axillary adenopathy.     Left upper body: No supraclavicular or axillary adenopathy.     Lower Body: No right inguinal adenopathy. No left inguinal adenopathy.  Skin:    General: Skin is warm.     Coloration: Skin is not pale.     Findings: No erythema or rash.  Neurological:     Mental Status: She is alert and oriented to person, place, and time. Mental status is at baseline.  Psychiatric:        Behavior: Behavior normal.        Thought Content: Thought content normal.        Judgment: Judgment normal.    Appointment on 12/20/2020  Component Date Value Ref Range Status  . Sodium 12/20/2020 137  135 - 145 mmol/L Final  . Potassium 12/20/2020 3.3* 3.5 - 5.1 mmol/L Final  . Chloride 12/20/2020 103  98 - 111 mmol/L Final  . CO2 12/20/2020 27  22 - 32 mmol/L Final  . Glucose, Bld 12/20/2020 107* 70 - 99 mg/dL Final   Glucose reference range applies only to samples taken after fasting for at least 8 hours.  . BUN 12/20/2020 10  8 - 23 mg/dL Final  . Creatinine, Ser 12/20/2020 0.97  0.44 - 1.00 mg/dL Final  . Calcium 12/20/2020 9.0  8.9 - 10.3 mg/dL Final  . Total Protein 12/20/2020 7.6  6.5 - 8.1 g/dL Final  . Albumin 12/20/2020 3.4* 3.5 - 5.0 g/dL Final  . AST 12/20/2020 17  15 - 41 U/L Final  . ALT 12/20/2020 14  0 - 44 U/L Final  . Alkaline Phosphatase 12/20/2020 34* 38 - 126 U/L Final  . Total Bilirubin 12/20/2020 0.4  0.3 - 1.2 mg/dL Final  . GFR, Estimated 12/20/2020 >60  >60 mL/min Final   Comment: (NOTE) Calculated using the CKD-EPI Creatinine Equation (2021)   . Anion gap 12/20/2020 7  5 - 15 Final   Performed at [facility], [address] [place], [place] [address]  . WBC 12/20/2020 6.4  4.0 - 10.5 K/uL Final  . RBC 12/20/2020 3.72* 3.87 - 5.11 MIL/uL Final  . Hemoglobin 12/20/2020 13.0  12.0 - 15.0 g/dL Final  . HCT 12/20/2020 39.9  36.0 - 46.0 % Final  . MCV 12/20/2020 107.3* 80.0 - 100.0 fL Final  . MCH 12/20/2020 34.9* 26.0 -  34.0 pg Final  .  MCHC 12/20/2020 32.6  30.0 - 36.0 g/dL Final  . RDW 12/20/2020 13.2  11.5 - 15.5 % Final  . Platelets 12/20/2020 349  150 - 400 K/uL Final  . nRBC 12/20/2020 0.0  0.0 - 0.2 % Final  . Neutrophils Relative % 12/20/2020 59  % Final  . Neutro Abs 12/20/2020 3.8  1.7 - 7.7 K/uL Final  . Lymphocytes Relative 12/20/2020 33  % Final  . Lymphs Abs 12/20/2020 2.1  0.7 - 4.0 K/uL Final  . Monocytes Relative 12/20/2020 6  % Final  . Monocytes Absolute 12/20/2020 0.4  0.1 - 1.0 K/uL Final  . Eosinophils Relative 12/20/2020 1  % Final  . Eosinophils Absolute 12/20/2020 0.0  0.0 - 0.5 K/uL Final  . Basophils Relative 12/20/2020 0  % Final  . Basophils Absolute 12/20/2020 0.0  0.0 - 0.1 K/uL Final  . Immature Granulocytes 12/20/2020 1  % Final  . Abs Immature Granulocytes 12/20/2020 0.03  0.00 - 0.07 K/uL Final   Performed at [facility], [address], [place], [place] [address]    Assessment:  Molly Perez is a 75 y.o. 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 She has a history of mild leukocytosis, thromobocytosis, and erythrocytosis. Platelet count has ranged between 512,000 - 622,000 since 03/24/2016. WBC has been mildly elevated (10,400 - 10,600). Hematocrit was normal until 09/18/2016. At that time, hematocrit was 50.7 with a hemoglobin of 16.6. She is on ababy aspirin.  Work-up on 11/05/2016 revealed a hematocrit of 43.7, hemoglobin 14.3, platelets 583,00, WBC 9800 with an ANC of 6600. Normal studies included: Ferritin, iron studies, and erythropoietin level. JAK2waspositive for V617F.  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 Marrow was normocellular to mildy hypercellular for age (40-50%) with trilineage hematopoiesis including a proliferation of atypical megakaryocytes with focal clusters and no increase in blasts.  There was no significant increase in marrow reticulin fibers. Storage iron was present. Cytogenetics were normal (75, XX). Flow cytometry was negative. FISH studies for MPN/CML were negative.  She is onhydroxyurea (began 12/21/2016). She is taking 1 tablet 4 days/week and 2 tablets 3 days a week (total 11 tablets/week). She denies any side effects.  Left lower extremity duplex on 01/26/2017 revealed no evidence of a DVT.  EGDon 07/04/2016 revealed reflux esophagitis, H pylori gastritis, and a small hiatal hernia. H pylori was treated with metronidazole, clarithromycin, and omeprazole. Colonoscopyon 07/04/2016 was normal.  Bone densityon 06/05/2017revealed osteopenia with a T-score of -2.1 in the AP spine L2-L4.Bone densityon 04/20/2019 revealed osteopenia with a T-score of -2.0 in the AP spine L1-L3.  Patient received the Moderna COVID-19 vaccine on 12/17/2019 and 01/14/2020. She received the Booster shot.  Symptomatically, she feels "good."  She has chronic shortness of breath on exertion.  She has lower back pain when she moves around.  She denies any early satiety.  Exam reveals no adenopathy or hepatosplenomegaly.  Plan: 1.   Labs today: CBC with diff, CMP, B12. 2.Essential thrombocytosis Clinically,  she is doing well Hematocrit  39.9. Hemoglobin  13.0. MCV  107.3. Platelets 349,000. WBC  6400 ([place] 3800). Platelet goal<=400,000. Continue hydroxyurea 1000 mg 4 days/week and 500 mg 3 days/week.  Continue baby aspirin 1/day. Maintain hematocrit < 42. Continue to monitor. 3.   RTC in 3 months for MD assess and labs (CBC with diff, CMP).    I discussed the assessment and treatment plan with the patient.  The  patient was provided an opportunity to ask questions and all were answered.  The patient agreed with the plan and demonstrated an understanding of the instructions.  The patient was advised to call back if the symptoms worsen or if the condition fails to  improve as anticipated.  I provided 16 minutes of face-to-face time during this this encounter and > 50% was spent counseling as documented under my assessment and plan. An additional 10 minutes were spent reviewing her chart (Epic and Care Everywhere) including notes, labs, and imaging studies.     Lequita Asal, MD, PhD 12/20/2020, 10:45 AM   I, Mirian Mo Tufford, am acting as Education administrator for [employer]. Mike Gip, MD, PhD.  I, Wynell Halberg C. Mike Gip, MD, have reviewed the above documentation for accuracy and completeness, and I agree with the above.

## 2020-12-20 ENCOUNTER — Inpatient Hospital Stay: Payer: Medicare HMO | Admitting: Hematology and Oncology

## 2020-12-20 ENCOUNTER — Other Ambulatory Visit: Payer: Self-pay

## 2020-12-20 ENCOUNTER — Inpatient Hospital Stay: Payer: Medicare HMO | Attending: Hematology and Oncology

## 2020-12-20 ENCOUNTER — Encounter: Payer: Self-pay | Admitting: Hematology and Oncology

## 2020-12-20 VITALS — BP 136/77 | HR 71 | Temp 98.5°F | Wt 213.8 lb

## 2020-12-20 DIAGNOSIS — D7589 Other specified diseases of blood and blood-forming organs: Secondary | ICD-10-CM

## 2020-12-20 DIAGNOSIS — Z880 Allergy status to penicillin: Secondary | ICD-10-CM | POA: Insufficient documentation

## 2020-12-20 DIAGNOSIS — Z832 Family history of diseases of the blood and blood-forming organs and certain disorders involving the immune mechanism: Secondary | ICD-10-CM | POA: Diagnosis not present

## 2020-12-20 DIAGNOSIS — M8580 Other specified disorders of bone density and structure, unspecified site: Secondary | ICD-10-CM | POA: Diagnosis not present

## 2020-12-20 DIAGNOSIS — E7800 Pure hypercholesterolemia, unspecified: Secondary | ICD-10-CM | POA: Diagnosis not present

## 2020-12-20 DIAGNOSIS — E538 Deficiency of other specified B group vitamins: Secondary | ICD-10-CM | POA: Diagnosis not present

## 2020-12-20 DIAGNOSIS — M545 Low back pain, unspecified: Secondary | ICD-10-CM | POA: Diagnosis not present

## 2020-12-20 DIAGNOSIS — E876 Hypokalemia: Secondary | ICD-10-CM | POA: Diagnosis not present

## 2020-12-20 DIAGNOSIS — Z833 Family history of diabetes mellitus: Secondary | ICD-10-CM | POA: Diagnosis not present

## 2020-12-20 DIAGNOSIS — Z823 Family history of stroke: Secondary | ICD-10-CM | POA: Insufficient documentation

## 2020-12-20 DIAGNOSIS — Z818 Family history of other mental and behavioral disorders: Secondary | ICD-10-CM | POA: Diagnosis not present

## 2020-12-20 DIAGNOSIS — R0602 Shortness of breath: Secondary | ICD-10-CM | POA: Insufficient documentation

## 2020-12-20 DIAGNOSIS — Z8249 Family history of ischemic heart disease and other diseases of the circulatory system: Secondary | ICD-10-CM | POA: Diagnosis not present

## 2020-12-20 DIAGNOSIS — Z8269 Family history of other diseases of the musculoskeletal system and connective tissue: Secondary | ICD-10-CM | POA: Diagnosis not present

## 2020-12-20 DIAGNOSIS — Z8719 Personal history of other diseases of the digestive system: Secondary | ICD-10-CM | POA: Diagnosis not present

## 2020-12-20 DIAGNOSIS — D473 Essential (hemorrhagic) thrombocythemia: Secondary | ICD-10-CM | POA: Diagnosis present

## 2020-12-20 DIAGNOSIS — M25561 Pain in right knee: Secondary | ICD-10-CM | POA: Insufficient documentation

## 2020-12-20 DIAGNOSIS — Z1589 Genetic susceptibility to other disease: Secondary | ICD-10-CM

## 2020-12-20 DIAGNOSIS — M549 Dorsalgia, unspecified: Secondary | ICD-10-CM | POA: Diagnosis not present

## 2020-12-20 DIAGNOSIS — Z79899 Other long term (current) drug therapy: Secondary | ICD-10-CM | POA: Insufficient documentation

## 2020-12-20 LAB — COMPREHENSIVE METABOLIC PANEL
ALT: 14 U/L (ref 0–44)
AST: 17 U/L (ref 15–41)
Albumin: 3.4 g/dL — ABNORMAL LOW (ref 3.5–5.0)
Alkaline Phosphatase: 34 U/L — ABNORMAL LOW (ref 38–126)
Anion gap: 7 (ref 5–15)
BUN: 10 mg/dL (ref 8–23)
CO2: 27 mmol/L (ref 22–32)
Calcium: 9 mg/dL (ref 8.9–10.3)
Chloride: 103 mmol/L (ref 98–111)
Creatinine, Ser: 0.97 mg/dL (ref 0.44–1.00)
GFR, Estimated: >60 mL/min (ref >60)
Glucose, Bld: 107 mg/dL — ABNORMAL HIGH (ref 70–99)
Potassium: 3.3 mmol/L — ABNORMAL LOW (ref 3.5–5.1)
Sodium: 137 mmol/L (ref 135–145)
Total Bilirubin: 0.4 mg/dL (ref 0.3–1.2)
Total Protein: 7.6 g/dL (ref 6.5–8.1)

## 2020-12-20 LAB — CBC WITH DIFFERENTIAL/PLATELET
Abs Immature Granulocytes: 0.03 10*3/uL (ref 0.00–0.07)
Basophils Absolute: 0 10*3/uL (ref 0.0–0.1)
Basophils Relative: 0 %
Eosinophils Absolute: 0 10*3/uL (ref 0.0–0.5)
Eosinophils Relative: 1 %
HCT: 39.9 % (ref 36.0–46.0)
Hemoglobin: 13 g/dL (ref 12.0–15.0)
Immature Granulocytes: 1 %
Lymphocytes Relative: 33 %
Lymphs Abs: 2.1 10*3/uL (ref 0.7–4.0)
MCH: 34.9 pg — ABNORMAL HIGH (ref 26.0–34.0)
MCHC: 32.6 g/dL (ref 30.0–36.0)
MCV: 107.3 fL — ABNORMAL HIGH (ref 80.0–100.0)
Monocytes Absolute: 0.4 10*3/uL (ref 0.1–1.0)
Monocytes Relative: 6 %
Neutro Abs: 3.8 10*3/uL (ref 1.7–7.7)
Neutrophils Relative %: 59 %
Platelets: 349 10*3/uL (ref 150–400)
RBC: 3.72 MIL/uL — ABNORMAL LOW (ref 3.87–5.11)
RDW: 13.2 % (ref 11.5–15.5)
WBC: 6.4 10*3/uL (ref 4.0–10.5)
nRBC: 0 % (ref 0.0–0.2)

## 2020-12-20 LAB — FOLATE: Folate: 23 ng/mL (ref >5.9)

## 2020-12-20 LAB — VITAMIN B12: Vitamin B-12: 748 pg/mL (ref 180–914)

## 2020-12-20 MED ORDER — POTASSIUM CHLORIDE ER 10 MEQ PO TBCR: 10.0000 meq | EXTENDED_RELEASE_TABLET | Freq: Every day | ORAL | 0 refills | Status: AC

## 2021-02-06 ENCOUNTER — Other Ambulatory Visit: Payer: Self-pay | Admitting: Hematology and Oncology

## 2021-02-06 DIAGNOSIS — D473 Essential (hemorrhagic) thrombocythemia: Secondary | ICD-10-CM

## 2021-03-13 ENCOUNTER — Other Ambulatory Visit: Payer: Self-pay

## 2021-03-13 DIAGNOSIS — E538 Deficiency of other specified B group vitamins: Secondary | ICD-10-CM

## 2021-03-19 ENCOUNTER — Telehealth: Payer: Self-pay | Admitting: Nurse Practitioner

## 2021-03-19 ENCOUNTER — Other Ambulatory Visit: Payer: Self-pay

## 2021-03-19 ENCOUNTER — Inpatient Hospital Stay: Payer: Medicare HMO | Admitting: Nurse Practitioner

## 2021-03-19 ENCOUNTER — Encounter: Payer: Self-pay | Admitting: Nurse Practitioner

## 2021-03-19 ENCOUNTER — Inpatient Hospital Stay: Payer: Medicare HMO | Attending: Nurse Practitioner

## 2021-03-19 VITALS — BP 154/88 | HR 66 | Temp 97.2°F | Resp 20 | Wt 215.1 lb

## 2021-03-19 DIAGNOSIS — M79605 Pain in left leg: Secondary | ICD-10-CM

## 2021-03-19 DIAGNOSIS — D473 Essential (hemorrhagic) thrombocythemia: Secondary | ICD-10-CM

## 2021-03-19 DIAGNOSIS — E538 Deficiency of other specified B group vitamins: Secondary | ICD-10-CM | POA: Diagnosis present

## 2021-03-19 LAB — CBC WITH DIFFERENTIAL/PLATELET
Abs Immature Granulocytes: 0.02 10*3/uL (ref 0.00–0.07)
Basophils Absolute: 0 10*3/uL (ref 0.0–0.1)
Basophils Relative: 0 %
Eosinophils Absolute: 0 10*3/uL (ref 0.0–0.5)
Eosinophils Relative: 1 %
HCT: 40.9 % (ref 36.0–46.0)
Hemoglobin: 13.4 g/dL (ref 12.0–15.0)
Immature Granulocytes: 0 %
Lymphocytes Relative: 30 %
Lymphs Abs: 2.2 10*3/uL (ref 0.7–4.0)
MCH: 34.6 pg — ABNORMAL HIGH (ref 26.0–34.0)
MCHC: 32.8 g/dL (ref 30.0–36.0)
MCV: 105.7 fL — ABNORMAL HIGH (ref 80.0–100.0)
Monocytes Absolute: 0.8 10*3/uL (ref 0.1–1.0)
Monocytes Relative: 10 %
Neutro Abs: 4.3 10*3/uL (ref 1.7–7.7)
Neutrophils Relative %: 59 %
Platelets: 361 10*3/uL (ref 150–400)
RBC: 3.87 MIL/uL (ref 3.87–5.11)
RDW: 13.2 % (ref 11.5–15.5)
WBC: 7.3 10*3/uL (ref 4.0–10.5)
nRBC: 0 % (ref 0.0–0.2)

## 2021-03-19 LAB — COMPREHENSIVE METABOLIC PANEL
ALT: 13 U/L (ref 0–44)
AST: 16 U/L (ref 15–41)
Albumin: 3.5 g/dL (ref 3.5–5.0)
Alkaline Phosphatase: 39 U/L (ref 38–126)
Anion gap: 6 (ref 5–15)
BUN: 12 mg/dL (ref 8–23)
CO2: 25 mmol/L (ref 22–32)
Calcium: 9 mg/dL (ref 8.9–10.3)
Chloride: 104 mmol/L (ref 98–111)
Creatinine, Ser: 0.99 mg/dL (ref 0.44–1.00)
GFR, Estimated: 59 mL/min — ABNORMAL LOW (ref >60)
Glucose, Bld: 95 mg/dL (ref 70–99)
Potassium: 3.7 mmol/L (ref 3.5–5.1)
Sodium: 135 mmol/L (ref 135–145)
Total Bilirubin: 0.6 mg/dL (ref 0.3–1.2)
Total Protein: 7.9 g/dL (ref 6.5–8.1)

## 2021-03-19 NOTE — Telephone Encounter (Signed)
Called to make pt aware that her STAT US was scheduled for 5/18 @ 8am. She stated that was too early for her. I called scheduling back and they stated since the appt was already triple booked, it would be extremely difficult to get anything until June. Made pt and Beckey Rutter, NP aware. Pt stated understanding and would wait until June.

## 2021-03-19 NOTE — Progress Notes (Signed)
[facility]  [address] [place], [place] [address] Phone: [phone]  Fax: [phone]   Clinic Day:  03/19/2021  Referring physician: Barbaraann Boys, MD  Chief Complaint: Molly Perez is a 75 y.o. 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  HPI: Molly Perez is a 75 y.o. 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  She has a history of mild leukocytosis, thromobocytosis, and erythrocytosis.   Platelet count has ranged between 512,000 - 622,000 since 03/24/2016.  WBC has been mildly elevated (10,400 - 10,600).  Hematocrit was normal until 09/18/2016.  At that time, hematocrit was 50.7 with a hemoglobin of 16.6.  She is on a baby aspirin.   Work-up on 11/05/2016 revealed a hematocrit of 43.7, hemoglobin 14.3, platelets 583,00, WBC 9800 with an ANC of 6600.  Normal studies included:  Ferritin, iron studies, and erythropoietin level.  JAK2 was positive for V617F.   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  Marrow was normocellular to mildy hypercellular for age (40-50%) with trilineage hematopoiesis including a proliferation of atypical megakaryocytes with focal clusters and no increase in blasts.  There was no significant increase in marrow reticulin fibers.  Storage iron was present.  Cytogenetics were normal (40, XX).  Flow cytometry was negative.  FISH studies for MPN/CML were negative.   She is on hydroxyurea (began 12/21/2016).  She is taking 1 tablet 4 days/week and 2 tablets 3 days a week (total 11 tablets/week).  She denies any side effects.   Left lower extremity duplex on 01/26/2017 revealed no evidence of a DVT.   EGD on 07/04/2016 revealed reflux esophagitis, H pylori gastritis, and a small hiatal hernia.  H pylori was treated with metronidazole, clarithromycin,  and omeprazole. Colonoscopy on 07/04/2016 was normal.   Bone density on 04/07/2016 revealed osteopenia with a T-score of -2.1 in the AP spine L2-L4.  Bone density on 04/20/2019 revealed osteopenia with a T-score of -2.0 in the AP spine L1-L3.  Patient received the Moderna COVID-19 vaccine on 12/17/2019 and 01/14/2020. She received the Booster shot.  Interval History: Patient returns to clinic for labs and follow up for hx of ET. She ocntinues hydrea. Tolerating well. Complains of left leg pain and swelling. Has chronic pain but this is new and worse. Denies any neurologic complaints. Denies recent fevers or illnesses. Denies any easy bleeding or bruising. No melena or hematochezia. Reports good appetite and denies weight loss. Denies chest pain. Denies any nausea, vomiting, constipation, or diarrhea. Denies urinary complaints. Patient offers no further specific complaints today.   Past Medical History:  Diagnosis Date   Cataract cortical, senile    GERD (gastroesophageal reflux disease)    Hypercholesterolemia    Obesity    Pre-diabetes    Prediabetes    Thrombocytopenia (HCC)    Vitamin D deficiency     Past Surgical History:  Procedure Laterality Date   ABDOMINAL HYSTERECTOMY     COLONOSCOPY     COLONOSCOPY WITH PROPOFOL N/A 07/04/2016   Procedure: COLONOSCOPY WITH PROPOFOL;  Surgeon: Lollie Sails, MD;  Location: [facility] ENDOSCOPY;  Service: Endoscopy;  Laterality: N/A;   ESOPHAGOGASTRODUODENOSCOPY (EGD) WITH PROPOFOL N/A 07/04/2016   Procedure: ESOPHAGOGASTRODUODENOSCOPY (EGD) WITH PROPOFOL;  Surgeon: Lollie Sails, MD;  Location: [facility] ENDOSCOPY;  Service: Endoscopy;  Laterality: N/A;    Family History  Problem Relation Age of Onset  Alzheimer's disease Father    Congestive Heart Failure Mother    Diabetes Mother    Multiple sclerosis Brother    Lupus Grandchild    Stroke Grandchild    Breast cancer Neg Hx     Social History:  reports that she has never smoked. Her  smokeless tobacco use includes snuff. She reports that she does not drink alcohol and does not use drugs. lives in [place].  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  She retired at age 26.  She worked for [employer].  She denies any exposure to radiation, toxins, and smoke.  She is concerned about finances and paying for new medications.  She is on a fixed income. The patient is alone today.  Allergies:  Allergies  Allergen Reactions   Amoxicillin Other (See Comments)   Lipitor [Atorvastatin] Other (See Comments)    Weakness in legs Weakness in legs   Penicillins Other (See Comments)    Pt states medication upsets her stomach    Current Medications: Current Outpatient Medications  Medication Sig Dispense Refill   acetaminophen (TYLENOL) 500 MG tablet Take 500 mg by mouth every 8 (eight) hours as needed for moderate pain.      aspirin EC 81 MG tablet Take 81 mg by mouth daily.     Calcium Carbonate-Vitamin D 600-400 MG-UNIT tablet Take 2 tablets by mouth daily.     Cholecalciferol (VITAMIN D3) 5000 UNITS CAPS Take 2,000 Units by mouth daily.      hydroxyurea (HYDREA) 500 MG capsule TAKE 2 CAPS MON, WED, FRI, SAT AND TAKE 1 CAPS (500 MG) ON TUE, THURS, SUN. 132 capsule 0   pantoprazole (PROTONIX) 40 MG tablet Take 40 mg by mouth daily.     potassium chloride (KLOR-CON) 10 MEQ tablet Take 1 tablet (10 mEq total) by mouth daily for 4 days. 4 tablet 0   pravastatin (PRAVACHOL) 80 MG tablet Take 80 mg by mouth daily.      vitamin B-12 (CYANOCOBALAMIN) 1000 MCG tablet Take 1 tablet (1,000 mcg total) by mouth daily. 30 tablet 0   ondansetron (ZOFRAN) 8 MG tablet Take 8 mg by mouth every 8 (eight) hours as needed.  (Patient not taking: No sig reported)     No current facility-administered medications for this visit.    Review of Systems  Constitutional:  Negative for chills, fever, malaise/fatigue and weight loss.  HENT:  Negative for hearing loss,  nosebleeds, sore throat and tinnitus.   Eyes:  Negative for blurred vision and double vision.  Respiratory:  Negative for cough, hemoptysis, shortness of breath and wheezing.   Cardiovascular:  Negative for chest pain, palpitations and leg swelling.  Gastrointestinal:  Negative for abdominal pain, blood in stool, constipation, diarrhea, melena, nausea and vomiting.  Genitourinary:  Negative for dysuria and urgency.  Musculoskeletal:  Negative for back pain, falls, joint pain and myalgias.  Skin:  Negative for itching and rash.  Neurological:  Negative for dizziness, tingling, sensory change, loss of consciousness, weakness and headaches.  Endo/Heme/Allergies:  Negative for environmental allergies. Does not bruise/bleed easily.  Psychiatric/Behavioral:  Negative for depression. The patient is not nervous/anxious and does not have insomnia.   Performance status (ECOG): 1  Vitals Blood pressure (!) 154/88, pulse 66, temperature (!) 97.2 F (36.2 C), resp. rate 20, weight 215 lb 0.9 oz (97.6 kg), SpO2 94 %.   General: Well-developed, well-nourished, no acute distress. Eyes: Pink conjunctiva, anicteric sclera. Lungs: Clear to auscultation bilaterally.  No audible wheezing or coughing Heart: Regular rate and rhythm.  Abdomen: Soft, nontender, nondistended.  Musculoskeletal: No edema, cyanosis, or clubbing. Neuro: Alert, answering all questions appropriately. Cranial nerves grossly intact. Skin: No rashes or petechiae noted. Psych: Normal affect.   Appointment on 03/19/2021  Component Date Value Ref Range Status   WBC 03/19/2021 7.3  4.0 - 10.5 K/uL Final   RBC 03/19/2021 3.87  3.87 - 5.11 MIL/uL Final   Hemoglobin 03/19/2021 13.4  12.0 - 15.0 g/dL Final   HCT 03/19/2021 40.9  36.0 - 46.0 % Final   MCV 03/19/2021 105.7* 80.0 - 100.0 fL Final   MCH 03/19/2021 34.6* 26.0 - 34.0 pg Final   MCHC 03/19/2021 32.8  30.0 - 36.0 g/dL Final   RDW 03/19/2021 13.2  11.5 - 15.5 % Final   Platelets  03/19/2021 361  150 - 400 K/uL Final   nRBC 03/19/2021 0.0  0.0 - 0.2 % Final   Neutrophils Relative % 03/19/2021 59  % Final   Neutro Abs 03/19/2021 4.3  1.7 - 7.7 K/uL Final   Lymphocytes Relative 03/19/2021 30  % Final   Lymphs Abs 03/19/2021 2.2  0.7 - 4.0 K/uL Final   Monocytes Relative 03/19/2021 10  % Final   Monocytes Absolute 03/19/2021 0.8  0.1 - 1.0 K/uL Final   Eosinophils Relative 03/19/2021 1  % Final   Eosinophils Absolute 03/19/2021 0.0  0.0 - 0.5 K/uL Final   Basophils Relative 03/19/2021 0  % Final   Basophils Absolute 03/19/2021 0.0  0.0 - 0.1 K/uL Final   Immature Granulocytes 03/19/2021 0  % Final   Abs Immature Granulocytes 03/19/2021 0.02  0.00 - 0.07 K/uL Final   Performed at [facility], [address] [place], [place] [address]   Sodium 03/19/2021 135  135 - 145 mmol/L Final   Potassium 03/19/2021 3.7  3.5 - 5.1 mmol/L Final   Chloride 03/19/2021 104  98 - 111 mmol/L Final   CO2 03/19/2021 25  22 - 32 mmol/L Final   Glucose, Bld 03/19/2021 95  70 - 99 mg/dL Final   Glucose reference range applies only to samples taken after fasting for at least 8 hours.   BUN 03/19/2021 12  8 - 23 mg/dL Final   Creatinine, Ser 03/19/2021 0.99  0.44 - 1.00 mg/dL Final   Calcium 03/19/2021 9.0  8.9 - 10.3 mg/dL Final   Total Protein 03/19/2021 7.9  6.5 - 8.1 g/dL Final   Albumin 03/19/2021 3.5  3.5 - 5.0 g/dL Final   AST 03/19/2021 16  15 - 41 U/L Final   ALT 03/19/2021 13  0 - 44 U/L Final   Alkaline Phosphatase 03/19/2021 39  38 - 126 U/L Final   Total Bilirubin 03/19/2021 0.6  0.3 - 1.2 mg/dL Final   GFR, Estimated 03/19/2021 59* >60 mL/min Final   Comment: (NOTE) Calculated using the CKD-EPI Creatinine Equation (2021)    Anion gap 03/19/2021 6  5 - 15 Final   Performed at [facility], [address], [place], [place] [address]    Assessment:    1.   Essential thrombocytosis- JAK2 was positive for V617F. Bone marrow in 12/2016 consistent  with myeloproliferative neoplasm most consistent with ET. Currently on hydrea 1000 mg 4 days/week and 500 mg 3 days/week. Platelets today within goal of </= 400,000. Today, 361,000. Maintain hematocrit < 42. Continue baby aspirin 81 mg daily.   2. Left leg pain - u/s to evaluate for  dvt.   Plan: Ultrasound left leg to rule out dvt 3 mo lab (cbc, cmp), MD to establish care  I discussed the assessment and treatment plan with the patient.  The patient was provided an opportunity to ask questions and all were answered.  The patient agreed with the plan and demonstrated an understanding of the instructions.  The patient was advised to call back if the symptoms worsen or if the condition fails to improve as anticipated.  Beckey Rutter, DNP, AGNP-C [place] at [facility]

## 2021-03-19 NOTE — Progress Notes (Signed)
Pt states her left leg has been hurting from her hip down to her foot c/o pain 8/10., was told she needs a total knee replacement.

## 2021-03-20 ENCOUNTER — Ambulatory Visit: Payer: Medicare HMO

## 2021-04-11 ENCOUNTER — Ambulatory Visit: Payer: Medicare HMO

## 2021-04-11 ENCOUNTER — Ambulatory Visit
Admission: RE | Admit: 2021-04-11 | Discharge: 2021-04-11 | Disposition: A | Discharge status: Home / Self Care | Payer: Medicare HMO | Source: Ambulatory Visit | Attending: Nurse Practitioner | Admitting: Nurse Practitioner

## 2021-04-11 ENCOUNTER — Other Ambulatory Visit: Payer: Self-pay

## 2021-04-11 DIAGNOSIS — M79605 Pain in left leg: Secondary | ICD-10-CM | POA: Diagnosis present

## 2021-05-27 ENCOUNTER — Other Ambulatory Visit: Payer: Self-pay | Admitting: *Deleted

## 2021-05-27 DIAGNOSIS — D473 Essential (hemorrhagic) thrombocythemia: Secondary | ICD-10-CM

## 2021-05-30 MED ORDER — HYDROXYUREA 500 MG PO CAPS: ORAL_CAPSULE | ORAL | 0 refills | Status: DC

## 2021-06-20 ENCOUNTER — Inpatient Hospital Stay: Payer: Medicare HMO | Attending: Oncology | Admitting: Oncology

## 2021-06-20 ENCOUNTER — Other Ambulatory Visit: Payer: Self-pay

## 2021-06-20 ENCOUNTER — Encounter: Payer: Self-pay | Admitting: Oncology

## 2021-06-20 ENCOUNTER — Inpatient Hospital Stay: Payer: Medicare HMO

## 2021-06-20 VITALS — BP 149/83 | HR 71 | Temp 97.1°F | Resp 18 | Wt 211.5 lb

## 2021-06-20 DIAGNOSIS — Z832 Family history of diseases of the blood and blood-forming organs and certain disorders involving the immune mechanism: Secondary | ICD-10-CM | POA: Insufficient documentation

## 2021-06-20 DIAGNOSIS — K219 Gastro-esophageal reflux disease without esophagitis: Secondary | ICD-10-CM | POA: Insufficient documentation

## 2021-06-20 DIAGNOSIS — Z880 Allergy status to penicillin: Secondary | ICD-10-CM | POA: Insufficient documentation

## 2021-06-20 DIAGNOSIS — E538 Deficiency of other specified B group vitamins: Secondary | ICD-10-CM | POA: Diagnosis not present

## 2021-06-20 DIAGNOSIS — Z8249 Family history of ischemic heart disease and other diseases of the circulatory system: Secondary | ICD-10-CM | POA: Diagnosis not present

## 2021-06-20 DIAGNOSIS — D473 Essential (hemorrhagic) thrombocythemia: Secondary | ICD-10-CM

## 2021-06-20 DIAGNOSIS — D75839 Thrombocytosis, unspecified: Secondary | ICD-10-CM | POA: Diagnosis not present

## 2021-06-20 DIAGNOSIS — Z818 Family history of other mental and behavioral disorders: Secondary | ICD-10-CM | POA: Insufficient documentation

## 2021-06-20 DIAGNOSIS — Z5111 Encounter for antineoplastic chemotherapy: Secondary | ICD-10-CM

## 2021-06-20 DIAGNOSIS — M79605 Pain in left leg: Secondary | ICD-10-CM

## 2021-06-20 DIAGNOSIS — Z833 Family history of diabetes mellitus: Secondary | ICD-10-CM | POA: Insufficient documentation

## 2021-06-20 DIAGNOSIS — Z823 Family history of stroke: Secondary | ICD-10-CM | POA: Diagnosis not present

## 2021-06-20 DIAGNOSIS — Z79899 Other long term (current) drug therapy: Secondary | ICD-10-CM | POA: Insufficient documentation

## 2021-06-20 DIAGNOSIS — E7800 Pure hypercholesterolemia, unspecified: Secondary | ICD-10-CM | POA: Diagnosis not present

## 2021-06-20 LAB — CBC WITH DIFFERENTIAL/PLATELET
Abs Immature Granulocytes: 0.02 10*3/uL (ref 0.00–0.07)
Basophils Absolute: 0.1 10*3/uL (ref 0.0–0.1)
Basophils Relative: 1 %
Eosinophils Absolute: 0.1 10*3/uL (ref 0.0–0.5)
Eosinophils Relative: 1 %
HCT: 40.3 % (ref 36.0–46.0)
Hemoglobin: 13.6 g/dL (ref 12.0–15.0)
Immature Granulocytes: 0 %
Lymphocytes Relative: 34 %
Lymphs Abs: 2.5 10*3/uL (ref 0.7–4.0)
MCH: 35.4 pg — ABNORMAL HIGH (ref 26.0–34.0)
MCHC: 33.7 g/dL (ref 30.0–36.0)
MCV: 104.9 fL — ABNORMAL HIGH (ref 80.0–100.0)
Monocytes Absolute: 0.7 10*3/uL (ref 0.1–1.0)
Monocytes Relative: 9 %
Neutro Abs: 4.2 10*3/uL (ref 1.7–7.7)
Neutrophils Relative %: 55 %
Platelets: 349 10*3/uL (ref 150–400)
RBC: 3.84 MIL/uL — ABNORMAL LOW (ref 3.87–5.11)
RDW: 13.5 % (ref 11.5–15.5)
WBC: 7.6 10*3/uL (ref 4.0–10.5)
nRBC: 0 % (ref 0.0–0.2)

## 2021-06-20 LAB — COMPREHENSIVE METABOLIC PANEL
ALT: 15 U/L (ref 0–44)
AST: 19 U/L (ref 15–41)
Albumin: 3.4 g/dL — ABNORMAL LOW (ref 3.5–5.0)
Alkaline Phosphatase: 37 U/L — ABNORMAL LOW (ref 38–126)
Anion gap: 6 (ref 5–15)
BUN: 10 mg/dL (ref 8–23)
CO2: 26 mmol/L (ref 22–32)
Calcium: 9 mg/dL (ref 8.9–10.3)
Chloride: 105 mmol/L (ref 98–111)
Creatinine, Ser: 0.91 mg/dL (ref 0.44–1.00)
GFR, Estimated: >60 mL/min (ref >60)
Glucose, Bld: 100 mg/dL — ABNORMAL HIGH (ref 70–99)
Potassium: 3.7 mmol/L (ref 3.5–5.1)
Sodium: 137 mmol/L (ref 135–145)
Total Bilirubin: 0.5 mg/dL (ref 0.3–1.2)
Total Protein: 7.6 g/dL (ref 6.5–8.1)

## 2021-06-20 MED ORDER — HYDROXYUREA 500 MG PO CAPS: ORAL_CAPSULE | ORAL | 0 refills | Status: DC

## 2021-06-20 NOTE — Progress Notes (Signed)
[facility]  [address], [address] [place], [place] [address] Phone: [phone]  Fax: [phone]   Clinic Day:  06/20/2021  Referring physician: Barbaraann Boys, MD  Chief Complaint: Molly Perez is a 75 y.o. female presents for follow up of  essential thrombocythemia (ET)   PERTINENT ONCOLOGY HISTORY Molly Perez is a 75 y.o.afemale who has above oncology history reviewed by me today presented for follow up visit for management of ET Patient previously followed up by Dr.Corcoran, patient switched care to me on 06/20/21 Extensive medical record review was performed by me  11/05/2016. 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  Marrow was normocellular to mildy hypercellular for age (40-50%) with trilineage hematopoiesis including a proliferation of atypical megakaryocytes with focal clusters and no increase in blasts.  There was no significant increase in marrow reticulin fibers.  Storage iron was present.  Cytogenetics were normal (13, XX).  Flow cytometry was negative.  FISH studies for MPN/CML were negative.  # 02/18/2018Started on Hydroxyurea    Interval History: Patient returns to clinic for labs and follow up for hx of ET. She ocntinues hydrea.  She tolerates well. No new complaints.   Past Medical History:  Diagnosis Date   Cataract cortical, senile    GERD (gastroesophageal reflux disease)    Hypercholesterolemia    Obesity    Pre-diabetes    Prediabetes    Thrombocytopenia (HCC)    Vitamin D deficiency     Past Surgical History:  Procedure Laterality Date   ABDOMINAL HYSTERECTOMY     COLONOSCOPY     COLONOSCOPY WITH PROPOFOL N/A 07/04/2016   Procedure: COLONOSCOPY WITH PROPOFOL;  Surgeon: Lollie Sails, MD;  Location: [facility] ENDOSCOPY;  Service: Endoscopy;  Laterality: N/A;   ESOPHAGOGASTRODUODENOSCOPY (EGD) WITH PROPOFOL N/A  07/04/2016   Procedure: ESOPHAGOGASTRODUODENOSCOPY (EGD) WITH PROPOFOL;  Surgeon: Lollie Sails, MD;  Location: [facility] ENDOSCOPY;  Service: Endoscopy;  Laterality: N/A;    Family History  Problem Relation Age of Onset   Alzheimer's disease Father    Congestive Heart Failure Mother    Diabetes Mother    Multiple sclerosis Brother    Lupus Grandchild    Stroke Grandchild    Breast cancer Neg Hx     Social History:  reports that she has never smoked. Her smokeless tobacco use includes snuff. She reports that she does not drink alcohol and does not use drugs. lives in [place].  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  She retired at age 67.  She worked for [employer].  She denies any exposure to radiation, toxins, and smoke.The patient is alone today.  Allergies:  Allergies  Allergen Reactions   Amoxicillin Other (See Comments)   Lipitor [Atorvastatin] Other (See Comments)    Weakness in legs Weakness in legs   Penicillins Other (See Comments)    Pt states medication upsets her stomach    Current Medications: Current Outpatient Medications  Medication Sig Dispense Refill   acetaminophen (TYLENOL) 500 MG tablet Take 500 mg by mouth every 8 (eight) hours as needed for moderate pain.      aspirin EC 81 MG tablet Take 81 mg by mouth daily.     Calcium Carbonate-Vitamin D 600-400 MG-UNIT tablet Take 2 tablets by mouth daily.     Cholecalciferol (VITAMIN D3) 5000 UNITS CAPS Take 2,000 Units by mouth daily.  pantoprazole (PROTONIX) 40 MG tablet Take 40 mg by mouth daily.     potassium chloride (KLOR-CON) 10 MEQ tablet Take 1 tablet (10 mEq total) by mouth daily for 4 days. 4 tablet 0   pravastatin (PRAVACHOL) 80 MG tablet Take 80 mg by mouth daily.      vitamin B-12 (CYANOCOBALAMIN) 1000 MCG tablet Take 1 tablet (1,000 mcg total) by mouth daily. 30 tablet 0   hydroxyurea (HYDREA) 500 MG capsule Take 2 capsules ('1000mg'$ ) on Monday, Wednesday,  Friday, Saturday. Take 1 capsule (500 mg) on Tuesday, Thursday, and Sunday. 132 capsule 0   ondansetron (ZOFRAN) 8 MG tablet Take 8 mg by mouth every 8 (eight) hours as needed.  (Patient not taking: No sig reported)     No current facility-administered medications for this visit.    Review of Systems  Constitutional:  Negative for chills, fever, malaise/fatigue and weight loss.  HENT:  Negative for hearing loss, nosebleeds, sore throat and tinnitus.   Eyes:  Negative for blurred vision and double vision.  Respiratory:  Negative for cough, hemoptysis, shortness of breath and wheezing.   Cardiovascular:  Negative for chest pain, palpitations and leg swelling.  Gastrointestinal:  Negative for abdominal pain, blood in stool, constipation, diarrhea, melena, nausea and vomiting.  Genitourinary:  Negative for dysuria and urgency.  Musculoskeletal:  Negative for back pain, falls, joint pain and myalgias.  Skin:  Negative for itching and rash.  Neurological:  Negative for dizziness, tingling, sensory change, loss of consciousness, weakness and headaches.  Endo/Heme/Allergies:  Negative for environmental allergies. Does not bruise/bleed easily.  Psychiatric/Behavioral:  Negative for depression. The patient is not nervous/anxious and does not have insomnia.   Performance status (ECOG): 1  Vitals Blood pressure (!) 149/83, pulse 71, temperature (!) 97.1 F (36.2 C), resp. rate 18, weight 211 lb 8.5 oz (95.9 kg), SpO2 98 %.   Physical Exam Constitutional:      General: She is not in acute distress.    Appearance: She is not diaphoretic.  HENT:     Head: Normocephalic and atraumatic.     Nose: Nose normal.     Mouth/Throat:     Pharynx: No oropharyngeal exudate.  Eyes:     General: No scleral icterus.    Pupils: Pupils are equal, round, and reactive to light.  Cardiovascular:     Rate and Rhythm: Normal rate and regular rhythm.     Heart sounds: No murmur heard. Pulmonary:     Effort:  Pulmonary effort is normal. No respiratory distress.     Breath sounds: No rales.  Chest:     Chest wall: No tenderness.  Abdominal:     General: There is no distension.     Palpations: Abdomen is soft.     Tenderness: There is no abdominal tenderness.  Musculoskeletal:        General: Normal range of motion.     Cervical back: Normal range of motion and neck supple.  Skin:    General: Skin is warm and dry.     Findings: No erythema.  Neurological:     Mental Status: She is alert and oriented to person, place, and time.     Cranial Nerves: No cranial nerve deficit.     Motor: No abnormal muscle tone.     Coordination: Coordination normal.  Psychiatric:        Mood and Affect: Affect normal.      Appointment on 06/20/2021  Component Date Value Ref Range Status  Sodium 06/20/2021 137  135 - 145 mmol/L Final   Potassium 06/20/2021 3.7  3.5 - 5.1 mmol/L Final   Chloride 06/20/2021 105  98 - 111 mmol/L Final   CO2 06/20/2021 26  22 - 32 mmol/L Final   Glucose, Bld 06/20/2021 100 (A) 70 - 99 mg/dL Final   Glucose reference range applies only to samples taken after fasting for at least 8 hours.   BUN 06/20/2021 10  8 - 23 mg/dL Final   Creatinine, Ser 06/20/2021 0.91  0.44 - 1.00 mg/dL Final   Calcium 06/20/2021 9.0  8.9 - 10.3 mg/dL Final   Total Protein 06/20/2021 7.6  6.5 - 8.1 g/dL Final   Albumin 06/20/2021 3.4 (A) 3.5 - 5.0 g/dL Final   AST 06/20/2021 19  15 - 41 U/L Final   ALT 06/20/2021 15  0 - 44 U/L Final   Alkaline Phosphatase 06/20/2021 37 (A) 38 - 126 U/L Final   Total Bilirubin 06/20/2021 0.5  0.3 - 1.2 mg/dL Final   GFR, Estimated 06/20/2021 >60  >60 mL/min Final   Comment: (NOTE) Calculated using the CKD-EPI Creatinine Equation (2021)    Anion gap 06/20/2021 6  5 - 15 Final   Performed at [facility], [address], [place], [place] [address]   WBC 06/20/2021 7.6  4.0 - 10.5 K/uL Final   RBC 06/20/2021 3.84 (A) 3.87 - 5.11 MIL/uL Final    Hemoglobin 06/20/2021 13.6  12.0 - 15.0 g/dL Final   HCT 06/20/2021 40.3  36.0 - 46.0 % Final   MCV 06/20/2021 104.9 (A) 80.0 - 100.0 fL Final   MCH 06/20/2021 35.4 (A) 26.0 - 34.0 pg Final   MCHC 06/20/2021 33.7  30.0 - 36.0 g/dL Final   RDW 06/20/2021 13.5  11.5 - 15.5 % Final   Platelets 06/20/2021 349  150 - 400 K/uL Final   nRBC 06/20/2021 0.0  0.0 - 0.2 % Final   Neutrophils Relative % 06/20/2021 55  % Final   Neutro Abs 06/20/2021 4.2  1.7 - 7.7 K/uL Final   Lymphocytes Relative 06/20/2021 34  % Final   Lymphs Abs 06/20/2021 2.5  0.7 - 4.0 K/uL Final   Monocytes Relative 06/20/2021 9  % Final   Monocytes Absolute 06/20/2021 0.7  0.1 - 1.0 K/uL Final   Eosinophils Relative 06/20/2021 1  % Final   Eosinophils Absolute 06/20/2021 0.1  0.0 - 0.5 K/uL Final   Basophils Relative 06/20/2021 1  % Final   Basophils Absolute 06/20/2021 0.1  0.0 - 0.1 K/uL Final   Immature Granulocytes 06/20/2021 0  % Final   Abs Immature Granulocytes 06/20/2021 0.02  0.00 - 0.07 K/uL Final   Performed at [facility], [address] [place],  [address]    Assessment and plan:   1. Essential thrombocythemia ([place])   2. B12 deficiency   3. Encounter for antineoplastic chemotherapy     #  Essential thrombocytosis- JAK2 was positive for V617F. 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  Currently on hydrea 1000 mg 4 days/week and 500 mg 3 days/week. Labs are reviewed and discussed with patient. Continue current regimen.  Aspirin '81mg'$ .   # History of vitamin B12 deficiency, continue oral B12  Follow up 3 mo lab (cbc, cmp), MD to establish care  I discussed the assessment and treatment plan with the patient.  The patient was provided an opportunity to ask questions and all were answered.  The patient agreed with the plan and demonstrated an understanding of the instructions.  The patient was advised to call back if the symptoms worsen  or if the condition fails to improve as anticipated.  Earlie Server, MD, PhD Hematology Oncology [place] at [facility] 06/20/2021

## 2021-07-03 ENCOUNTER — Other Ambulatory Visit: Payer: Self-pay | Admitting: Pediatrics

## 2021-07-03 ENCOUNTER — Other Ambulatory Visit: Payer: Self-pay

## 2021-07-03 ENCOUNTER — Ambulatory Visit
Admission: EM | Admit: 2021-07-03 | Discharge: 2021-07-03 | Disposition: A | Discharge status: Home / Self Care | Payer: Medicare HMO | Attending: Emergency Medicine | Admitting: Emergency Medicine

## 2021-07-03 DIAGNOSIS — M7711 Lateral epicondylitis, right elbow: Secondary | ICD-10-CM | POA: Diagnosis not present

## 2021-07-03 DIAGNOSIS — R600 Localized edema: Secondary | ICD-10-CM

## 2021-07-03 DIAGNOSIS — Z1231 Encounter for screening mammogram for malignant neoplasm of breast: Secondary | ICD-10-CM

## 2021-07-03 MED ORDER — METHYLPREDNISOLONE 4 MG PO TBPK: ORAL_TABLET | ORAL | 0 refills | Status: DC

## 2021-07-03 NOTE — ED Triage Notes (Signed)
Pt c/o right arm pain, radiating from her shoulder down to her hand. Pt states the pain has been increasing over several weeks. Pt did see her PCP yesterday and was given a referral to Orthopedic Surg. Pt also reports edema in her right leg, states her daughter told her it looks worse than normal. Pt denies any chronic edema issues. Pt does have edema BLE at this time.

## 2021-07-03 NOTE — Discharge Instructions (Addendum)
Start the Medrol dose pack tomorrow morning with breakfast.  Follow the rehabilitation exercises given at discharge.  For the swelling in your lower extremities please keep your legs elevated is much as possible and also increase your physical activities so that you improve circulation and remove the excess fluid from your legs.  Return for new or worsening symptoms.

## 2021-07-03 NOTE — ED Provider Notes (Signed)
[facility]    CSN: [id number] Arrival date & time: 07/03/21  1756      History   Chief Complaint Chief Complaint  Patient presents with   Shoulder Pain    right   Leg Swelling    BLE    HPI Molly Perez is a 75 y.o. female.   HPI  75 year old female here for evaluation of right arm pain.  Patient reports that she has been experiencing right arm pain for last couple of weeks.  She states it feels like there is an crawling under her skin and like her grip in her right hand is weaker.  She does not member any falls or injury.  She states that she has been sewing a lot and using her right arm a lot.  Secondarily she reports that her daughter told her that her legs look swollen but she is not concerned about that.  She denies any chest pain or shortness of breath.  She is also having any calf pain.  She is obese and was recently told to start exercising due to her elevated blood pressure and prediabetes.  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   Internal derangement of right 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   Back pain, chronic 05/09/2015   Hypercholesteremia 05/09/2015   Obesity, Class II, BMI 35-39.9 05/09/2015   Prediabetes 05/09/2015   Avitaminosis D 05/09/2015    Past Surgical History:  Procedure Laterality Date   ABDOMINAL HYSTERECTOMY     COLONOSCOPY     COLONOSCOPY WITH PROPOFOL N/A  07/04/2016   Procedure: COLONOSCOPY WITH PROPOFOL;  Surgeon: Lollie Sails, MD;  Location: [facility] ENDOSCOPY;  Service: Endoscopy;  Laterality: N/A;   ESOPHAGOGASTRODUODENOSCOPY (EGD) WITH PROPOFOL N/A 07/04/2016   Procedure: ESOPHAGOGASTRODUODENOSCOPY (EGD) WITH PROPOFOL;  Surgeon: Lollie Sails, MD;  Location: [facility] ENDOSCOPY;  Service: Endoscopy;  Laterality: N/A;    OB History   No obstetric history on file.      Home Medications    Prior to Admission medications   Medication Sig Start Date End Date Taking? Authorizing Provider  acetaminophen (TYLENOL) 500 MG tablet Take 500 mg by mouth every 8 (eight) hours as needed for moderate pain.    Yes [provider]  aspirin EC 81 MG tablet Take 81 mg by mouth daily.   Yes [provider]  Calcium Carbonate-Vitamin D 600-400 MG-UNIT tablet Take 2 tablets by mouth daily.   Yes [provider]  Cholecalciferol (VITAMIN D3) 5000 UNITS CAPS Take 2,000 Units by mouth daily.  03/29/15  Yes [provider]  hydroxyurea (HYDREA) 500 MG capsule Take 2 capsules ('1000mg'$ ) on Monday, Wednesday, Friday, Saturday. Take 1 capsule (500 mg) on Tuesday, Thursday, and Sunday. 06/20/21  Yes Earlie Server, MD  methylPREDNISolone (MEDROL DOSEPAK) 4 MG TBPK tablet Take according to the package insert. 07/03/21  Yes Margarette Canada, NP  ondansetron [facility]) 8  MG tablet Take 8 mg by mouth every 8 (eight) hours as needed. 12/17/16  Yes [provider]  pantoprazole (PROTONIX) 40 MG tablet Take 40 mg by mouth daily.   Yes [provider]  pravastatin (PRAVACHOL) 80 MG tablet Take 80 mg by mouth daily.  07/08/16 04/14/38 Yes [provider]  vitamin B-12 (CYANOCOBALAMIN) 1000 MCG tablet Take 1 tablet (1,000 mcg total) by mouth daily. 08/01/15  Yes Plonk, Gwyndolyn Saxon, MD  potassium chloride (KLOR-CON) 10 MEQ tablet Take 1 tablet (10 mEq total) by mouth daily for 4 days. 12/20/20 06/20/21  Lequita Asal, MD    Family  History Family History  Problem Relation Age of Onset   Alzheimer's disease Father    Congestive Heart Failure Mother    Diabetes Mother    Multiple sclerosis Brother    Lupus Grandchild    Stroke Grandchild    Breast cancer Neg Hx     Social History Social History   Tobacco Use   Smoking status: Never   Smokeless tobacco: Current    Types: Snuff  Vaping Use   Vaping Use: Never used  Substance Use Topics   Alcohol use: No    Alcohol/week: 0.0 standard drinks   Drug use: No     Allergies   Amoxicillin, Lipitor [atorvastatin], and Penicillins   Review of Systems Review of Systems  Constitutional:  Negative for activity change, appetite change and fever.  Respiratory:  Negative for shortness of breath and wheezing.   Cardiovascular:  Positive for leg swelling. Negative for chest pain and palpitations.  Musculoskeletal:  Positive for arthralgias and myalgias. Negative for joint swelling.  Skin:  Negative for color change.  Hematological: Negative.   Psychiatric/Behavioral: Negative.      Physical Exam Triage Vital Signs ED Triage Vitals  Enc Vitals Group     BP 07/03/21 1809 (!) 160/93     Pulse Rate 07/03/21 1809 85     Resp 07/03/21 1809 18     Temp 07/03/21 1809 98.2 F (36.8 C)     Temp Source 07/03/21 1809 Oral     SpO2 07/03/21 1809 96 %     Weight 07/03/21 1808 205 lb (93 kg)     Height 07/03/21 1808 '5\' 1"'$  (1.549 m)     Head Circumference --      Peak Flow --      Pain Score 07/03/21 1807 8     Pain Loc --      Pain Edu? --      Excl. in [place]? --    No data found.  Updated Vital Signs BP (!) 160/93 (BP Location: Left Arm)   Pulse 85   Temp 98.2 F (36.8 C) (Oral)   Resp 18   Ht '5\' 1"'$  (1.549 m)   Wt 205 lb (93 kg)   SpO2 96%   BMI 38.73 kg/m   Visual Acuity Right Eye Distance:   Left Eye Distance:   Bilateral Distance:    Right Eye Near:   Left Eye Near:    Bilateral Near:     Physical Exam Vitals and nursing note reviewed.   Constitutional:      General: She is not in acute distress.    Appearance: Normal appearance. She is obese. She is not ill-appearing.  HENT:     Head: Normocephalic and atraumatic.  Cardiovascular:     Rate and Rhythm: Normal rate and regular rhythm.     Pulses: Normal pulses.  Heart sounds: Normal heart sounds. No murmur heard.   No gallop.  Pulmonary:     Effort: Pulmonary effort is normal.     Breath sounds: Normal breath sounds. No wheezing, rhonchi or rales.  Musculoskeletal:        General: No swelling, tenderness or deformity. Normal range of motion.     Right lower leg: Edema present.     Left lower leg: Edema present.  Skin:    General: Skin is warm and dry.     Capillary Refill: Capillary refill takes less than 2 seconds.     Findings: No erythema or rash.  Neurological:     General: No focal deficit present.     Mental Status: She is alert and oriented to person, place, and time.  Psychiatric:        Mood and Affect: Mood normal.        Behavior: Behavior normal.        Thought Content: Thought content normal.        Judgment: Judgment normal.     UC Treatments / Results  Labs (all labs ordered are listed, but only abnormal results are displayed) Labs Reviewed - No data to display  EKG   Radiology No results found.  Procedures Procedures (including critical care time)  Medications Ordered in UC Medications - No data to display  Initial Impression / Assessment and Plan / UC Course  I have reviewed the triage vital signs and the nursing notes.  Pertinent labs & imaging results that were available during my care of the patient were reviewed by me and considered in my medical decision making (see chart for details).  Is a nontoxic-appearing 75 year old female here for evaluation of left arm pain that has been present for the past couple weeks.  Patient states that her pain is in her proximal right forearm and goes up the back of her right arm and into  the posterior aspect of her right shoulder.  This is not a result of a fall or injury.  Patient states that she does sew a lot.  She denies any numbness or tingling in her hands but she states that she feels like her grip is weaker and she feels like there is something crawling under her skin.  Of secondary note, though patient states is not of a concern for her she wants her arm evaluated, and swelling in both of her legs.  She states she has any chronic swelling issues.  Patient does have swelling of bilateral lower extremities but they are equal in both sides.  Patient's physical exam reveals a benign cardiopulmonary exam with clear lung sounds in all fields and heart sounds are S1-S2 and free of murmur, rub, or ectopy.  Patient's right arm is in normal anatomical alignment.  Radial and ulnar pulses are 2+.  Peripheral sensation in her fingers is present.  Grip strength bilaterally is 5/5.  Patient has no pain with passive range of motion of her right wrist.  She has no pain with flexion or extension of her elbow but she does have pain with supination of her right forearm.  There is tenderness with palpation of the body of the flexor digitorum and also with palpation over the lateral epicondyle of the humerus.  No tenderness with palpation of the olecranon process or medial epicondyle.  Patient is complaining of some mild pain with palpation of both the body of her bicep and tricep.  There is no muscle defect noted.  Patient has no significant pain with range of motion of her shoulder girdle either.  Suspect patient has lateral epicondylitis secondary to her repetitive use with sewing.  Patient does have nonpitting edema in both lower extremities.  DP PT pulses are 2+ bilaterally.  Patient has no pain in her calf and negative Bevelyn Buckles' sign bilaterally.  Suspect patient swelling is dependent edema secondary to her poor mobility.  Do not suspect that this is cardiac related as patient is not having any chest pain  or shortness of breath.  She is able to speak in full sentences.  There is no dyspnea on exertion.  I have encouraged patient to keep her legs elevated and see if the swelling goes down.  Will treat lateral epicondylitis with a Medrol Dosepak that she can start tomorrow and home PT.   Final Clinical Impressions(s) / UC Diagnoses   Final diagnoses:  Lateral epicondylitis of right elbow  Bilateral lower extremity edema     Discharge Instructions      Start the Medrol dose pack tomorrow morning with breakfast.  Follow the rehabilitation exercises given at discharge.  For the swelling in your lower extremities please keep your legs elevated is much as possible and also increase your physical activities so that you improve circulation and remove the excess fluid from your legs.  Return for new or worsening symptoms.      ED Prescriptions     Medication Sig Dispense Auth. Provider   methylPREDNISolone (MEDROL DOSEPAK) 4 MG TBPK tablet Take according to the package insert. 1 each Margarette Canada, NP      PDMP not reviewed this encounter.   Margarette Canada, NP 07/03/21 1902

## 2021-07-05 ENCOUNTER — Other Ambulatory Visit: Payer: Self-pay | Admitting: Pediatrics

## 2021-07-05 DIAGNOSIS — Z780 Asymptomatic menopausal state: Secondary | ICD-10-CM

## 2021-08-05 ENCOUNTER — Ambulatory Visit
Admission: EM | Admit: 2021-08-05 | Discharge: 2021-08-05 | Disposition: A | Discharge status: Home / Self Care | Payer: Medicare HMO | Attending: Emergency Medicine | Admitting: Emergency Medicine

## 2021-08-05 ENCOUNTER — Other Ambulatory Visit: Payer: Self-pay

## 2021-08-05 ENCOUNTER — Encounter: Payer: Self-pay | Admitting: Emergency Medicine

## 2021-08-05 DIAGNOSIS — M79601 Pain in right arm: Secondary | ICD-10-CM | POA: Diagnosis not present

## 2021-08-05 DIAGNOSIS — M7711 Lateral epicondylitis, right elbow: Secondary | ICD-10-CM | POA: Diagnosis not present

## 2021-08-05 MED ORDER — BACLOFEN 10 MG PO TABS: 10.0000 mg | ORAL_TABLET | Freq: Three times a day (TID) | ORAL | 0 refills | Status: DC

## 2021-08-05 MED ORDER — METHYLPREDNISOLONE 4 MG PO TBPK: ORAL_TABLET | ORAL | 0 refills | Status: DC

## 2021-08-05 NOTE — ED Provider Notes (Signed)
[facility]    CSN: [id number] Arrival date & time: 08/05/21  1745      History   Chief Complaint Chief Complaint  Patient presents with   Arm Pain    right    HPI Molly Perez is a 75 y.o. female.   HPI  75 year old female here for evaluation of right arm pain.  Patient was evaluated in this urgent care on 07/03/2021 for pain in her right arm and was diagnosed with lateral epicondylitis.  She was treated with a Medrol Dosepak and reports that her pain improved.  Her pain is since returned and she is complaining of pain that radiates from her shoulder down to her fingers.  She still has tenderness over the lateral epicondyle of her right arm.  She states that she feels like she also has a decrease in the strength of her grip on her right side but denies any numbness or tingling in her fingers.  She has in the interim been evaluated by orthopedics and diagnosed with cervical radiculopathy, lumbar radiculopathy, and lateral epicondylitis.  She was encouraged to have physical therapy but she is unsure if she wants to do that or not.  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   Bursitis of left shoulder 07/30/2016   Elevated total protein 07/09/2016   MCL deficiency, 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   Internal derangement of right 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   Back pain, chronic 05/09/2015   Hypercholesteremia 05/09/2015   Obesity, Class II, BMI 35-39.9 05/09/2015   Prediabetes 05/09/2015   Avitaminosis D 05/09/2015    Past  Surgical History:  Procedure Laterality Date   ABDOMINAL HYSTERECTOMY     COLONOSCOPY     COLONOSCOPY WITH PROPOFOL N/A 07/04/2016   Procedure: COLONOSCOPY WITH PROPOFOL;  Surgeon: Lollie Sails, MD;  Location: [facility] ENDOSCOPY;  Service: Endoscopy;  Laterality: N/A;   ESOPHAGOGASTRODUODENOSCOPY (EGD) WITH PROPOFOL N/A 07/04/2016   Procedure: ESOPHAGOGASTRODUODENOSCOPY (EGD) WITH PROPOFOL;  Surgeon: Lollie Sails, MD;  Location: [facility] ENDOSCOPY;  Service: Endoscopy;  Laterality: N/A;    OB History   No obstetric history on file.      Home Medications    Prior to Admission medications   Medication Sig Start Date End Date Taking? Authorizing Provider  acetaminophen (TYLENOL) 500 MG tablet Take 500 mg by mouth every 8 (eight) hours as needed for moderate pain.    Yes [provider]  aspirin EC 81 MG tablet Take 81 mg by mouth daily.   Yes [provider]  baclofen (LIORESAL) 10 MG tablet Take 1 tablet (10 mg total) by mouth 3 (three) times daily. 08/05/21  Yes Margarette Canada, NP  Calcium Carbonate-Vitamin D 600-400 MG-UNIT tablet Take 2 tablets by mouth daily.   Yes [provider]  Cholecalciferol (VITAMIN D3) 5000 UNITS CAPS Take 2,000 Units by mouth daily.  03/29/15  Yes [provider]  hydroxyurea (HYDREA) 500 MG capsule Take 2 capsules (1000mg ) on Monday, Wednesday, Friday, Saturday. Take 1 capsule (500 mg) on  Tuesday, Thursday, and Sunday. 06/20/21  Yes Earlie Server, MD  ondansetron (ZOFRAN) 8 MG tablet Take 8 mg by mouth every 8 (eight) hours as needed. 12/17/16  Yes [provider]  pantoprazole (PROTONIX) 40 MG tablet Take 40 mg by mouth daily.   Yes [provider]  potassium chloride (KLOR-CON) 10 MEQ tablet Take 1 tablet (10 mEq total) by mouth daily for 4 days. 12/20/20 08/05/21 Yes Corcoran, Drue Second, MD  pravastatin (PRAVACHOL) 80 MG tablet Take 80 mg by mouth daily.  07/08/16 04/14/38 Yes [provider]   methylPREDNISolone (MEDROL DOSEPAK) 4 MG TBPK tablet Take according to the package insert. 08/05/21   Margarette Canada, NP  vitamin B-12 (CYANOCOBALAMIN) 1000 MCG tablet Take 1 tablet (1,000 mcg total) by mouth daily. 08/01/15   Plonk, Gwyndolyn Saxon, MD    Family History Family History  Problem Relation Age of Onset   Alzheimer's disease Father    Congestive Heart Failure Mother    Diabetes Mother    Multiple sclerosis Brother    Lupus Grandchild    Stroke Grandchild    Breast cancer Neg Hx     Social History Social History   Tobacco Use   Smoking status: Never   Smokeless tobacco: Current    Types: Snuff  Vaping Use   Vaping Use: Never used  Substance Use Topics   Alcohol use: No    Alcohol/week: 0.0 standard drinks   Drug use: No     Allergies   Amoxicillin, Lipitor [atorvastatin], and Penicillins   Review of Systems Review of Systems  Constitutional:  Negative for activity change, appetite change and fever.  Musculoskeletal:  Positive for arthralgias and myalgias.  Neurological:  Negative for weakness and numbness.    Physical Exam Triage Vital Signs ED Triage Vitals  Enc Vitals Group     BP 08/05/21 1833 (!) 166/74     Pulse Rate 08/05/21 1833 82     Resp 08/05/21 1833 18     Temp 08/05/21 1833 97.6 F (36.4 C)     Temp Source 08/05/21 1833 Oral     SpO2 08/05/21 1833 98 %     Weight 08/05/21 1832 205 lb 0.4 oz (93 kg)     Height 08/05/21 1832 5\' 1"  (1.549 m)     Head Circumference --      Peak Flow --      Pain Score 08/05/21 1830 9     Pain Loc --      Pain Edu? --      Excl. in [place]? --    No data found.  Updated Vital Signs BP (!) 166/74 (BP Location: Left Arm)   Pulse 82   Temp 97.6 F (36.4 C) (Oral)   Resp 18   Ht 5\' 1"  (1.549 m)   Wt 205 lb 0.4 oz (93 kg)   SpO2 98%   BMI 38.74 kg/m   Visual Acuity Right Eye Distance:   Left Eye Distance:   Bilateral Distance:    Right Eye Near:   Left Eye Near:    Bilateral Near:     Physical  Exam Vitals and nursing note reviewed.  Constitutional:      General: She is not in acute distress.    Appearance: Normal appearance. She is obese. She is not ill-appearing.  HENT:     Head: Normocephalic and atraumatic.  Musculoskeletal:        General: Tenderness present. No swelling or deformity. Normal range of motion.  Skin:  General: Skin is warm and dry.     Capillary Refill: Capillary refill takes less than 2 seconds.     Findings: No bruising or erythema.  Neurological:     General: No focal deficit present.     Mental Status: She is alert and oriented to person, place, and time.  Psychiatric:        Mood and Affect: Mood normal.        Behavior: Behavior normal.        Thought Content: Thought content normal.        Judgment: Judgment normal.     UC Treatments / Results  Labs (all labs ordered are listed, but only abnormal results are displayed) Labs Reviewed - No data to display  EKG   Radiology No results found.  Procedures Procedures (including critical care time)  Medications Ordered in UC Medications - No data to display  Initial Impression / Assessment and Plan / UC Course  I have reviewed the triage vital signs and the nursing notes.  Pertinent labs & imaging results that were available during my care of the patient were reviewed by me and considered in my medical decision making (see chart for details).  Patient is a nontoxic-appearing 75 year old female here for evaluation of right arm pain as outlined in HPI above.  Patient reports that her pain extends from her right shoulder to her right wrist.  She states that she feels like her grip is decreased in her right hand as well.  Patient has been evaluated by orthopedics and physical therapy was recommended but she is unsure she wants to pursue that line of treatment.  She states that she got relief from the steroids previously and she would like those again.  She was also diagnosed with cervical and  lumbar radiculopathy.  Patient's physical exam reveals a right arm in normal anatomical alignment.  Patient states that she is having pain in the right arm but she is moving the arm in all different directions.  Grips in her right hand are 5/5.  Patient has no tenderness with palpation of the muscles of her forearm, her radius, or her ulna.  She does have tenderness with palpation of the lateral epicondyle of her humerus.  She is also complaining of pain with palpation of the bicep and tricep muscle groups of the right arm.  No overt pain with palpation of the right shoulder.  There is some mild tension in the trapezius but no tenderness in the right paracervical spinous region.  Also no midline spinous tenderness.  Patient does have an unrelated area of tenderness, on her right flank.  I do not feel that this pain is related to her shoulder issues.  I believe her shoulder and arm issues are secondary to lateral epicondylitis as well as her cervical radiculopathy.  I will treat her with a second Medrol Dosepak and baclofen to help with the muscle tension in her shoulder.  I have encouraged her to follow-up with orthopedics and pursue physical therapy as this will be of the most benefit to her.   Final Clinical Impressions(s) / UC Diagnoses   Final diagnoses:  Right arm pain  Lateral epicondylitis of right elbow     Discharge Instructions      Start the steroid pack in the morning and take it for 6 days as directed.  Take the Baclofen every 8 hours as needed for neck and arm pain.  Follow-up with Orthopedics to discuss physical therapy for your  neck and right arm pain.      ED Prescriptions     Medication Sig Dispense Auth. Provider   methylPREDNISolone (MEDROL DOSEPAK) 4 MG TBPK tablet Take according to the package insert. 1 each Margarette Canada, NP   baclofen (LIORESAL) 10 MG tablet Take 1 tablet (10 mg total) by mouth 3 (three) times daily. 43 each Margarette Canada, NP      PDMP not  reviewed this encounter.   Margarette Canada, NP 08/05/21 1851

## 2021-08-05 NOTE — ED Triage Notes (Signed)
Pt c/o right arm pain, radiating from her shoulder to her hand and down her back. She was seen for this on 07/03/21 and given prednisone and was better but worse the last few days. Pt saw ortho and was told to PT, but pt does not know if she wants to doe PT.

## 2021-08-05 NOTE — Discharge Instructions (Addendum)
Start the steroid pack in the morning and take it for 6 days as directed.  Take the Baclofen every 8 hours as needed for neck and arm pain.  Follow-up with Orthopedics to discuss physical therapy for your neck and right arm pain.

## 2021-08-06 ENCOUNTER — Ambulatory Visit
Admission: RE | Admit: 2021-08-06 | Discharge: 2021-08-06 | Disposition: A | Discharge status: Home / Self Care | Payer: Medicare HMO | Source: Ambulatory Visit | Attending: Pediatrics | Admitting: Pediatrics

## 2021-08-06 DIAGNOSIS — Z780 Asymptomatic menopausal state: Secondary | ICD-10-CM | POA: Insufficient documentation

## 2021-08-23 ENCOUNTER — Other Ambulatory Visit: Payer: Self-pay

## 2021-08-23 ENCOUNTER — Ambulatory Visit
Admission: RE | Admit: 2021-08-23 | Discharge: 2021-08-23 | Disposition: A | Discharge status: Home / Self Care | Payer: Medicare HMO | Source: Ambulatory Visit | Attending: Pediatrics | Admitting: Pediatrics

## 2021-08-23 DIAGNOSIS — Z1231 Encounter for screening mammogram for malignant neoplasm of breast: Secondary | ICD-10-CM | POA: Insufficient documentation

## 2021-09-19 ENCOUNTER — Inpatient Hospital Stay: Payer: Medicare HMO | Admitting: Oncology

## 2021-09-19 ENCOUNTER — Ambulatory Visit: Payer: Medicare HMO | Admitting: Oncology

## 2021-09-19 ENCOUNTER — Other Ambulatory Visit: Payer: Medicare HMO

## 2021-09-19 ENCOUNTER — Inpatient Hospital Stay: Payer: Medicare HMO | Attending: Oncology

## 2021-09-19 ENCOUNTER — Encounter: Payer: Self-pay | Admitting: Oncology

## 2021-09-19 ENCOUNTER — Other Ambulatory Visit: Payer: Self-pay

## 2021-09-19 VITALS — BP 129/82 | HR 80 | Temp 97.1°F | Resp 18 | Wt 213.6 lb

## 2021-09-19 DIAGNOSIS — E538 Deficiency of other specified B group vitamins: Secondary | ICD-10-CM | POA: Diagnosis not present

## 2021-09-19 DIAGNOSIS — D75839 Thrombocytosis, unspecified: Secondary | ICD-10-CM | POA: Diagnosis not present

## 2021-09-19 DIAGNOSIS — D473 Essential (hemorrhagic) thrombocythemia: Secondary | ICD-10-CM | POA: Diagnosis present

## 2021-09-19 DIAGNOSIS — Z79899 Other long term (current) drug therapy: Secondary | ICD-10-CM | POA: Diagnosis not present

## 2021-09-19 DIAGNOSIS — Z823 Family history of stroke: Secondary | ICD-10-CM | POA: Diagnosis not present

## 2021-09-19 DIAGNOSIS — Z818 Family history of other mental and behavioral disorders: Secondary | ICD-10-CM | POA: Diagnosis not present

## 2021-09-19 DIAGNOSIS — Z8249 Family history of ischemic heart disease and other diseases of the circulatory system: Secondary | ICD-10-CM | POA: Insufficient documentation

## 2021-09-19 DIAGNOSIS — Z7982 Long term (current) use of aspirin: Secondary | ICD-10-CM | POA: Diagnosis not present

## 2021-09-19 DIAGNOSIS — Z833 Family history of diabetes mellitus: Secondary | ICD-10-CM | POA: Diagnosis not present

## 2021-09-19 DIAGNOSIS — Z832 Family history of diseases of the blood and blood-forming organs and certain disorders involving the immune mechanism: Secondary | ICD-10-CM | POA: Insufficient documentation

## 2021-09-19 DIAGNOSIS — Z880 Allergy status to penicillin: Secondary | ICD-10-CM | POA: Diagnosis not present

## 2021-09-19 DIAGNOSIS — Z8269 Family history of other diseases of the musculoskeletal system and connective tissue: Secondary | ICD-10-CM | POA: Diagnosis not present

## 2021-09-19 LAB — CBC WITH DIFFERENTIAL/PLATELET
Abs Immature Granulocytes: 0.04 10*3/uL (ref 0.00–0.07)
Basophils Absolute: 0 10*3/uL (ref 0.0–0.1)
Basophils Relative: 1 %
Eosinophils Absolute: 0 10*3/uL (ref 0.0–0.5)
Eosinophils Relative: 1 %
HCT: 42.2 % (ref 36.0–46.0)
Hemoglobin: 13.6 g/dL (ref 12.0–15.0)
Immature Granulocytes: 1 %
Lymphocytes Relative: 32 %
Lymphs Abs: 2.4 10*3/uL (ref 0.7–4.0)
MCH: 34.9 pg — ABNORMAL HIGH (ref 26.0–34.0)
MCHC: 32.2 g/dL (ref 30.0–36.0)
MCV: 108.2 fL — ABNORMAL HIGH (ref 80.0–100.0)
Monocytes Absolute: 0.8 10*3/uL (ref 0.1–1.0)
Monocytes Relative: 11 %
Neutro Abs: 4 10*3/uL (ref 1.7–7.7)
Neutrophils Relative %: 54 %
Platelets: 384 10*3/uL (ref 150–400)
RBC: 3.9 MIL/uL (ref 3.87–5.11)
RDW: 13.2 % (ref 11.5–15.5)
WBC: 7.4 10*3/uL (ref 4.0–10.5)
nRBC: 0 % (ref 0.0–0.2)

## 2021-09-19 LAB — COMPREHENSIVE METABOLIC PANEL
ALT: 13 U/L (ref 0–44)
AST: 17 U/L (ref 15–41)
Albumin: 3.4 g/dL — ABNORMAL LOW (ref 3.5–5.0)
Alkaline Phosphatase: 38 U/L (ref 38–126)
Anion gap: 6 (ref 5–15)
BUN: 11 mg/dL (ref 8–23)
CO2: 28 mmol/L (ref 22–32)
Calcium: 9 mg/dL (ref 8.9–10.3)
Chloride: 103 mmol/L (ref 98–111)
Creatinine, Ser: 0.91 mg/dL (ref 0.44–1.00)
GFR, Estimated: >60 mL/min (ref >60)
Glucose, Bld: 97 mg/dL (ref 70–99)
Potassium: 3.7 mmol/L (ref 3.5–5.1)
Sodium: 137 mmol/L (ref 135–145)
Total Bilirubin: 0.5 mg/dL (ref 0.3–1.2)
Total Protein: 7.3 g/dL (ref 6.5–8.1)

## 2021-09-19 MED ORDER — HYDROXYUREA 500 MG PO CAPS
ORAL_CAPSULE | ORAL | 0 refills | Status: DC
Start: 2021-09-19 — End: 2021-12-20

## 2021-09-19 NOTE — Progress Notes (Signed)
[facility]  [address], [address] [place], [place] [address] Phone: [phone]  Fax: [phone]   Clinic Day:  09/19/2021  Referring physician: Barbaraann Boys, MD  Chief Complaint: Molly Perez is a 75 y.o. female presents for follow up of  essential thrombocythemia (ET)   PERTINENT ONCOLOGY HISTORY Molly Perez is a 75 y.o.afemale who has above oncology history reviewed by me today presented for follow up visit for management of ET Patient previously followed up by Dr.Corcoran, patient switched care to me on 06/20/21 Extensive medical record review was performed by me  11/05/2016. 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  Marrow was normocellular to mildy hypercellular for age (40-50%) with trilineage hematopoiesis including a proliferation of atypical megakaryocytes with focal clusters and no increase in blasts.  There was no significant increase in marrow reticulin fibers.  Storage iron was present.  Cytogenetics were normal (63, XX).  Flow cytometry was negative.  FISH studies for MPN/CML were negative.  # 02/18/2018Started on Hydroxyurea    Interval History: Patient returns to clinic for labs and follow up for hx of ET.  Patient is on hydroxyurea.  She reports tolerating well.  No new complaints  Past Medical History:  Diagnosis Date   Cataract cortical, senile    GERD (gastroesophageal reflux disease)    Hypercholesterolemia    Obesity    Pre-diabetes    Prediabetes    Thrombocytopenia (HCC)    Vitamin D deficiency     Past Surgical History:  Procedure Laterality Date   ABDOMINAL HYSTERECTOMY     COLONOSCOPY     COLONOSCOPY WITH PROPOFOL N/A 07/04/2016   Procedure: COLONOSCOPY WITH PROPOFOL;  Surgeon: Lollie Sails, MD;  Location: [facility] ENDOSCOPY;  Service: Endoscopy;  Laterality: N/A;   ESOPHAGOGASTRODUODENOSCOPY (EGD) WITH  PROPOFOL N/A 07/04/2016   Procedure: ESOPHAGOGASTRODUODENOSCOPY (EGD) WITH PROPOFOL;  Surgeon: Lollie Sails, MD;  Location: [facility] ENDOSCOPY;  Service: Endoscopy;  Laterality: N/A;    Family History  Problem Relation Age of Onset   Alzheimer's disease Father    Congestive Heart Failure Mother    Diabetes Mother    Multiple sclerosis Brother    Lupus Grandchild    Stroke Grandchild    Breast cancer Neg Hx     Social History:  reports that she has never smoked. Her smokeless tobacco use includes snuff. She reports that she does not drink alcohol and does not use drugs. lives in [place].  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  She retired at age 49.  She worked for [employer].  She denies any exposure to radiation, toxins, and smoke.The patient is alone today.  Allergies:  Allergies  Allergen Reactions   Amoxicillin Other (See Comments)   Lipitor [Atorvastatin] Other (See Comments)    Weakness in legs Weakness in legs   Penicillins Other (See Comments)    Pt states medication upsets her stomach    Current Medications: Current Outpatient Medications  Medication Sig Dispense Refill   acetaminophen (TYLENOL) 500 MG tablet Take 500 mg by mouth every 8 (eight) hours as needed for moderate pain.      amLODipine (NORVASC) 5 MG tablet Take 5 mg by mouth daily.     aspirin EC 81 MG tablet Take 81 mg by mouth daily.     baclofen (LIORESAL) 10 MG tablet Take 1 tablet (10 mg total) by mouth 3 (  three) times daily. 30 each 0   Calcium Carbonate-Vitamin D 600-400 MG-UNIT tablet Take 2 tablets by mouth daily.     Cholecalciferol (VITAMIN D3) 5000 UNITS CAPS Take 2,000 Units by mouth daily.      pantoprazole (PROTONIX) 40 MG tablet Take 40 mg by mouth daily.     pravastatin (PRAVACHOL) 80 MG tablet Take 80 mg by mouth daily.      vitamin B-12 (CYANOCOBALAMIN) 1000 MCG tablet Take 1 tablet (1,000 mcg total) by mouth daily. 30 tablet 0   hydroxyurea  (HYDREA) 500 MG capsule Take 2 capsules (1000mg ) on Monday, Wednesday, Friday, Saturday. Take 1 capsule (500 mg) on Tuesday, Thursday, and Sunday. 132 capsule 0   ondansetron (ZOFRAN) 8 MG tablet Take 8 mg by mouth every 8 (eight) hours as needed. (Patient not taking: Reported on 09/19/2021)     potassium chloride (KLOR-CON) 10 MEQ tablet Take 1 tablet (10 mEq total) by mouth daily for 4 days. (Patient not taking: Reported on 09/19/2021) 4 tablet 0   No current facility-administered medications for this visit.    Review of Systems  Constitutional:  Negative for chills, fever, malaise/fatigue and weight loss.  HENT:  Negative for hearing loss, nosebleeds, sore throat and tinnitus.   Eyes:  Negative for blurred vision and double vision.  Respiratory:  Negative for cough, hemoptysis, shortness of breath and wheezing.   Cardiovascular:  Negative for chest pain, palpitations and leg swelling.  Gastrointestinal:  Negative for abdominal pain, blood in stool, constipation, diarrhea, melena, nausea and vomiting.  Genitourinary:  Negative for dysuria and urgency.  Musculoskeletal:  Negative for back pain, falls, joint pain and myalgias.  Skin:  Negative for itching and rash.  Neurological:  Negative for dizziness, tingling, sensory change, loss of consciousness, weakness and headaches.  Endo/Heme/Allergies:  Negative for environmental allergies. Does not bruise/bleed easily.  Psychiatric/Behavioral:  Negative for depression. The patient is not nervous/anxious and does not have insomnia.   Performance status (ECOG): 1  Vitals Blood pressure 129/82, pulse 80, temperature (!) 97.1 F (36.2 C), resp. rate 18, weight 213 lb 9.6 oz (96.9 kg).   Physical Exam Constitutional:      General: She is not in acute distress.    Appearance: She is not diaphoretic.  HENT:     Head: Normocephalic and atraumatic.     Nose: Nose normal.     Mouth/Throat:     Pharynx: No oropharyngeal exudate.  Eyes:      General: No scleral icterus.    Pupils: Pupils are equal, round, and reactive to light.  Cardiovascular:     Rate and Rhythm: Normal rate and regular rhythm.     Heart sounds: No murmur heard. Pulmonary:     Effort: Pulmonary effort is normal. No respiratory distress.     Breath sounds: No rales.  Chest:     Chest wall: No tenderness.  Abdominal:     General: There is no distension.     Palpations: Abdomen is soft.     Tenderness: There is no abdominal tenderness.  Musculoskeletal:        General: Normal range of motion.     Cervical back: Normal range of motion and neck supple.  Skin:    General: Skin is warm and dry.     Findings: No erythema.  Neurological:     Mental Status: She is alert and oriented to person, place, and time.     Cranial Nerves: No cranial nerve deficit.  Motor: No abnormal muscle tone.     Coordination: Coordination normal.  Psychiatric:        Mood and Affect: Affect normal.      Appointment on 09/19/2021  Component Date Value Ref Range Status   WBC 09/19/2021 7.4  4.0 - 10.5 K/uL Final   RBC 09/19/2021 3.90  3.87 - 5.11 MIL/uL Final   Hemoglobin 09/19/2021 13.6  12.0 - 15.0 g/dL Final   HCT 09/19/2021 42.2  36.0 - 46.0 % Final   MCV 09/19/2021 108.2 (H)  80.0 - 100.0 fL Final   MCH 09/19/2021 34.9 (H)  26.0 - 34.0 pg Final   MCHC 09/19/2021 32.2  30.0 - 36.0 g/dL Final   RDW 09/19/2021 13.2  11.5 - 15.5 % Final   Platelets 09/19/2021 384  150 - 400 K/uL Final   nRBC 09/19/2021 0.0  0.0 - 0.2 % Final   Neutrophils Relative % 09/19/2021 54  % Final   Neutro Abs 09/19/2021 4.0  1.7 - 7.7 K/uL Final   Lymphocytes Relative 09/19/2021 32  % Final   Lymphs Abs 09/19/2021 2.4  0.7 - 4.0 K/uL Final   Monocytes Relative 09/19/2021 11  % Final   Monocytes Absolute 09/19/2021 0.8  0.1 - 1.0 K/uL Final   Eosinophils Relative 09/19/2021 1  % Final   Eosinophils Absolute 09/19/2021 0.0  0.0 - 0.5 K/uL Final   Basophils Relative 09/19/2021 1  % Final    Basophils Absolute 09/19/2021 0.0  0.0 - 0.1 K/uL Final   Immature Granulocytes 09/19/2021 1  % Final   Abs Immature Granulocytes 09/19/2021 0.04  0.00 - 0.07 K/uL Final   Performed at [facility], [place], [place] [address]   Sodium 09/19/2021 137  135 - 145 mmol/L Final   Potassium 09/19/2021 3.7  3.5 - 5.1 mmol/L Final   Chloride 09/19/2021 103  98 - 111 mmol/L Final   CO2 09/19/2021 28  22 - 32 mmol/L Final   Glucose, Bld 09/19/2021 97  70 - 99 mg/dL Final   Glucose reference range applies only to samples taken after fasting for at least 8 hours.   BUN 09/19/2021 11  8 - 23 mg/dL Final   Creatinine, Ser 09/19/2021 0.91  0.44 - 1.00 mg/dL Final   Calcium 09/19/2021 9.0  8.9 - 10.3 mg/dL Final   Total Protein 09/19/2021 7.3  6.5 - 8.1 g/dL Final   Albumin 09/19/2021 3.4 (L)  3.5 - 5.0 g/dL Final   AST 09/19/2021 17  15 - 41 U/L Final   ALT 09/19/2021 13  0 - 44 U/L Final   Alkaline Phosphatase 09/19/2021 38  38 - 126 U/L Final   Total Bilirubin 09/19/2021 0.5  0.3 - 1.2 mg/dL Final   GFR, Estimated 09/19/2021 >60  >60 mL/min Final   Comment: (NOTE) Calculated using the CKD-EPI Creatinine Equation (2021)    Anion gap 09/19/2021 6  5 - 15 Final   Performed at [facility], [address] [place], [place] [address]    Assessment and plan:   1. Essential thrombocythemia ([place])     #  Essential thrombocytosis- JAK2 was positive for V617F. 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  Continue hydroxyurea 1000 mg 4 days/week and 500 mg 3 days/week. Labs reviewed and discussed with patient.  Blood counts are stable.  Continue aspirin 81 mg  # History of vitamin B12 deficiency, continue oral B12.  We will check vitamin  B12 the next visit.  Follow up 3 mo lab (cbc, cmp B12), MD to establish care  I discussed the assessment and treatment plan with the patient.  The patient was provided an opportunity to ask  questions and all were answered.  The patient agreed with the plan and demonstrated an understanding of the instructions.  The patient was advised to call back if the symptoms worsen or if the condition fails to improve as anticipated.  Earlie Server, MD, PhD Hematology Oncology [address] at [facility] 09/19/2021

## 2021-09-19 NOTE — Progress Notes (Signed)
Pt here for follow up. No new concerns voiced.   

## 2021-12-20 ENCOUNTER — Encounter: Payer: Self-pay | Admitting: Oncology

## 2021-12-20 ENCOUNTER — Inpatient Hospital Stay: Payer: Medicare HMO | Admitting: Oncology

## 2021-12-20 ENCOUNTER — Other Ambulatory Visit: Payer: Self-pay

## 2021-12-20 ENCOUNTER — Inpatient Hospital Stay: Payer: Medicare HMO | Attending: Oncology

## 2021-12-20 VITALS — BP 127/82 | HR 69 | Temp 96.5°F | Wt 213.0 lb

## 2021-12-20 DIAGNOSIS — Z833 Family history of diabetes mellitus: Secondary | ICD-10-CM | POA: Diagnosis not present

## 2021-12-20 DIAGNOSIS — D473 Essential (hemorrhagic) thrombocythemia: Secondary | ICD-10-CM | POA: Diagnosis not present

## 2021-12-20 DIAGNOSIS — Z880 Allergy status to penicillin: Secondary | ICD-10-CM | POA: Diagnosis not present

## 2021-12-20 DIAGNOSIS — Z832 Family history of diseases of the blood and blood-forming organs and certain disorders involving the immune mechanism: Secondary | ICD-10-CM | POA: Diagnosis not present

## 2021-12-20 DIAGNOSIS — E538 Deficiency of other specified B group vitamins: Secondary | ICD-10-CM

## 2021-12-20 DIAGNOSIS — Z823 Family history of stroke: Secondary | ICD-10-CM | POA: Diagnosis not present

## 2021-12-20 DIAGNOSIS — Z8249 Family history of ischemic heart disease and other diseases of the circulatory system: Secondary | ICD-10-CM | POA: Diagnosis not present

## 2021-12-20 DIAGNOSIS — Z8269 Family history of other diseases of the musculoskeletal system and connective tissue: Secondary | ICD-10-CM | POA: Diagnosis not present

## 2021-12-20 DIAGNOSIS — D75839 Thrombocytosis, unspecified: Secondary | ICD-10-CM | POA: Insufficient documentation

## 2021-12-20 DIAGNOSIS — Z79899 Other long term (current) drug therapy: Secondary | ICD-10-CM | POA: Diagnosis not present

## 2021-12-20 DIAGNOSIS — Z811 Family history of alcohol abuse and dependence: Secondary | ICD-10-CM | POA: Insufficient documentation

## 2021-12-20 DIAGNOSIS — Z5111 Encounter for antineoplastic chemotherapy: Secondary | ICD-10-CM | POA: Diagnosis not present

## 2021-12-20 LAB — COMPREHENSIVE METABOLIC PANEL
ALT: 13 U/L (ref 0–44)
AST: 15 U/L (ref 15–41)
Albumin: 3.5 g/dL (ref 3.5–5.0)
Alkaline Phosphatase: 42 U/L (ref 38–126)
Anion gap: 8 (ref 5–15)
BUN: 14 mg/dL (ref 8–23)
CO2: 28 mmol/L (ref 22–32)
Calcium: 9.4 mg/dL (ref 8.9–10.3)
Chloride: 100 mmol/L (ref 98–111)
Creatinine, Ser: 0.89 mg/dL (ref 0.44–1.00)
GFR, Estimated: >60 mL/min (ref >60)
Glucose, Bld: 97 mg/dL (ref 70–99)
Potassium: 4.1 mmol/L (ref 3.5–5.1)
Sodium: 136 mmol/L (ref 135–145)
Total Bilirubin: 0.2 mg/dL — ABNORMAL LOW (ref 0.3–1.2)
Total Protein: 7.6 g/dL (ref 6.5–8.1)

## 2021-12-20 LAB — CBC WITH DIFFERENTIAL/PLATELET
Abs Immature Granulocytes: 0.02 10*3/uL (ref 0.00–0.07)
Basophils Absolute: 0 10*3/uL (ref 0.0–0.1)
Basophils Relative: 1 %
Eosinophils Absolute: 0.1 10*3/uL (ref 0.0–0.5)
Eosinophils Relative: 1 %
HCT: 42 % (ref 36.0–46.0)
Hemoglobin: 13.7 g/dL (ref 12.0–15.0)
Immature Granulocytes: 0 %
Lymphocytes Relative: 33 %
Lymphs Abs: 2 10*3/uL (ref 0.7–4.0)
MCH: 34.6 pg — ABNORMAL HIGH (ref 26.0–34.0)
MCHC: 32.6 g/dL (ref 30.0–36.0)
MCV: 106.1 fL — ABNORMAL HIGH (ref 80.0–100.0)
Monocytes Absolute: 0.7 10*3/uL (ref 0.1–1.0)
Monocytes Relative: 11 %
Neutro Abs: 3.3 10*3/uL (ref 1.7–7.7)
Neutrophils Relative %: 54 %
Platelets: 359 10*3/uL (ref 150–400)
RBC: 3.96 MIL/uL (ref 3.87–5.11)
RDW: 13.5 % (ref 11.5–15.5)
WBC: 6.1 10*3/uL (ref 4.0–10.5)
nRBC: 0 % (ref 0.0–0.2)

## 2021-12-20 LAB — VITAMIN B12: Vitamin B-12: 761 pg/mL (ref 180–914)

## 2021-12-20 MED ORDER — HYDROXYUREA 500 MG PO CAPS
ORAL_CAPSULE | ORAL | 1 refills | Status: DC
Start: 2021-12-20 — End: 2022-06-26

## 2021-12-20 NOTE — Progress Notes (Signed)
Hematology/Oncology Progress note [phone] [phone]      Clinic Day:  12/20/2021  Referring physician: Barbaraann Boys, MD  Chief Complaint: Molly Perez is a 76 y.o. female presents for follow up of  essential thrombocythemia (ET)   PERTINENT ONCOLOGY HISTORY Molly Perez is a 76 y.o.afemale who has above oncology history reviewed by me today presented for follow up visit for management of ET Patient previously followed up by Dr.Corcoran, patient switched care to me on 06/20/21 Extensive medical record review was performed by me  11/05/2016. 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  Marrow was normocellular to mildy hypercellular for age (40-50%) with trilineage hematopoiesis including a proliferation of atypical megakaryocytes with focal clusters and no increase in blasts.  There was no significant increase in marrow reticulin fibers.  Storage iron was present.  Cytogenetics were normal (28, XX).  Flow cytometry was negative.  FISH studies for MPN/CML were negative.  # 02/18/2018Started on Hydroxyurea    Interval History:  Patient presents for follow-up of management of essential thrombocythemia. She takes hydroxyurea.  She reports feeling well.  No new complaints.   Past Medical History:  Diagnosis Date   Cataract cortical, senile    GERD (gastroesophageal reflux disease)    Hypercholesterolemia    Obesity    Pre-diabetes    Prediabetes    Thrombocytopenia (HCC)    Vitamin D deficiency     Past Surgical History:  Procedure Laterality Date   ABDOMINAL HYSTERECTOMY     COLONOSCOPY     COLONOSCOPY WITH PROPOFOL N/A 07/04/2016   Procedure: COLONOSCOPY WITH PROPOFOL;  Surgeon: Lollie Sails, MD;  Location: [facility] ENDOSCOPY;  Service: Endoscopy;  Laterality: N/A;   ESOPHAGOGASTRODUODENOSCOPY (EGD) WITH PROPOFOL N/A 07/04/2016   Procedure:  ESOPHAGOGASTRODUODENOSCOPY (EGD) WITH PROPOFOL;  Surgeon: Lollie Sails, MD;  Location: [facility] ENDOSCOPY;  Service: Endoscopy;  Laterality: N/A;    Family History  Problem Relation Age of Onset   Alzheimer's disease Father    Congestive Heart Failure Mother    Diabetes Mother    Multiple sclerosis Brother    Lupus Grandchild    Stroke Grandchild    Breast cancer Neg Hx     Social History:  reports that she has never smoked. Her smokeless tobacco use includes snuff. She reports that she does not drink alcohol and does not use drugs. lives in [place].  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  She worked for [employer].  She denies any exposure to radiation, toxins, and smoke.The patient is alone today.  Allergies:  Allergies  Allergen Reactions   Amoxicillin Other (See Comments)   Lipitor [Atorvastatin] Other (See Comments)    Weakness in legs Weakness in legs   Penicillins Other (See Comments)    Pt states medication upsets her stomach    Current Medications: Current Outpatient Medications  Medication Sig Dispense Refill   acetaminophen (TYLENOL) 500 MG tablet Take 500 mg by mouth every 8 (eight) hours as needed for moderate pain.      amLODipine (NORVASC) 5 MG tablet Take 5 mg by mouth daily.     aspirin EC 81 MG tablet Take 81 mg by mouth daily.     baclofen (LIORESAL) 10 MG tablet Take 1 tablet (10 mg total) by mouth 3 (three) times daily. 30 each 0   Calcium Carbonate-Vitamin D 600-400 MG-UNIT  tablet Take 2 tablets by mouth daily.     Cholecalciferol (VITAMIN D3) 5000 UNITS CAPS Take 2,000 Units by mouth daily.      pantoprazole (PROTONIX) 40 MG tablet Take 40 mg by mouth daily.     pravastatin (PRAVACHOL) 80 MG tablet Take 80 mg by mouth daily.      vitamin B-12 (CYANOCOBALAMIN) 1000 MCG tablet Take 1 tablet (1,000 mcg total) by mouth daily. 30 tablet 0   hydroxyurea (HYDREA) 500 MG capsule Take 2 capsules  (1000mg ) on Monday, Wednesday, Friday, Saturday. Take 1 capsule (500 mg) on Tuesday, Thursday, and Sunday. 132 capsule 1   ondansetron (ZOFRAN) 8 MG tablet Take 8 mg by mouth every 8 (eight) hours as needed. (Patient not taking: Reported on 09/19/2021)     potassium chloride (KLOR-CON) 10 MEQ tablet Take 1 tablet (10 mEq total) by mouth daily for 4 days. (Patient not taking: Reported on 09/19/2021) 4 tablet 0   No current facility-administered medications for this visit.    Review of Systems  Constitutional:  Negative for chills, fever, malaise/fatigue and weight loss.  HENT:  Negative for hearing loss, nosebleeds, sore throat and tinnitus.   Eyes:  Negative for blurred vision and double vision.  Respiratory:  Negative for cough, hemoptysis, shortness of breath and wheezing.   Cardiovascular:  Negative for chest pain, palpitations and leg swelling.  Gastrointestinal:  Negative for abdominal pain, blood in stool, constipation, diarrhea, melena, nausea and vomiting.  Genitourinary:  Negative for dysuria and urgency.  Musculoskeletal:  Negative for back pain, falls, joint pain and myalgias.  Skin:  Negative for itching and rash.  Neurological:  Negative for dizziness, tingling, sensory change, loss of consciousness, weakness and headaches.  Endo/Heme/Allergies:  Negative for environmental allergies. Does not bruise/bleed easily.  Psychiatric/Behavioral:  Negative for depression. The patient is not nervous/anxious and does not have insomnia.   Performance status (ECOG): 1  Vitals Blood pressure 127/82, pulse 69, temperature (!) 96.5 F (35.8 C), temperature source Tympanic, weight 213 lb (96.6 kg).   Physical Exam Constitutional:      General: She is not in acute distress.    Appearance: She is not diaphoretic.  HENT:     Head: Normocephalic and atraumatic.     Nose: Nose normal.     Mouth/Throat:     Pharynx: No oropharyngeal exudate.  Eyes:     General: No scleral icterus.     Pupils: Pupils are equal, round, and reactive to light.  Cardiovascular:     Rate and Rhythm: Normal rate and regular rhythm.     Heart sounds: No murmur heard. Pulmonary:     Effort: Pulmonary effort is normal. No respiratory distress.     Breath sounds: No rales.  Chest:     Chest wall: No tenderness.  Abdominal:     General: There is no distension.     Palpations: Abdomen is soft.     Tenderness: There is no abdominal tenderness.  Musculoskeletal:        General: Normal range of motion.     Cervical back: Normal range of motion and neck supple.  Skin:    General: Skin is warm and dry.     Findings: No erythema.  Neurological:     Mental Status: She is alert and oriented to person, place, and time.     Cranial Nerves: No cranial nerve deficit.     Motor: No abnormal muscle tone.     Coordination: Coordination normal.  Psychiatric:  Mood and Affect: Affect normal.      Appointment on 12/20/2021  Component Date Value Ref Range Status   Vitamin B-12 12/20/2021 761  180 - 914 pg/mL Final   Comment: (NOTE) This assay is not validated for testing neonatal or myeloproliferative syndrome specimens for Vitamin B12 levels. Performed at [facility], [place] [address], [place], [place] [address]    WBC 12/20/2021 6.1  4.0 - 10.5 K/uL Final   RBC 12/20/2021 3.96  3.87 - 5.11 MIL/uL Final   Hemoglobin 12/20/2021 13.7  12.0 - 15.0 g/dL Final   HCT 12/20/2021 42.0  36.0 - 46.0 % Final   MCV 12/20/2021 106.1 (H)  80.0 - 100.0 fL Final   MCH 12/20/2021 34.6 (H)  26.0 - 34.0 pg Final   MCHC 12/20/2021 32.6  30.0 - 36.0 g/dL Final   RDW 12/20/2021 13.5  11.5 - 15.5 % Final   Platelets 12/20/2021 359  150 - 400 K/uL Final   nRBC 12/20/2021 0.0  0.0 - 0.2 % Final   Neutrophils Relative % 12/20/2021 54  % Final   Neutro Abs 12/20/2021 3.3  1.7 - 7.7 K/uL Final   Lymphocytes Relative 12/20/2021 33  % Final   Lymphs Abs 12/20/2021 2.0  0.7 - 4.0 K/uL Final   Monocytes Relative  12/20/2021 11  % Final   Monocytes Absolute 12/20/2021 0.7  0.1 - 1.0 K/uL Final   Eosinophils Relative 12/20/2021 1  % Final   Eosinophils Absolute 12/20/2021 0.1  0.0 - 0.5 K/uL Final   Basophils Relative 12/20/2021 1  % Final   Basophils Absolute 12/20/2021 0.0  0.0 - 0.1 K/uL Final   Immature Granulocytes 12/20/2021 0  % Final   Abs Immature Granulocytes 12/20/2021 0.02  0.00 - 0.07 K/uL Final   Performed at [facility], [address] [place], [place] [address]   Sodium 12/20/2021 136  135 - 145 mmol/L Final   Potassium 12/20/2021 4.1  3.5 - 5.1 mmol/L Final   Chloride 12/20/2021 100  98 - 111 mmol/L Final   CO2 12/20/2021 28  22 - 32 mmol/L Final   Glucose, Bld 12/20/2021 97  70 - 99 mg/dL Final   Glucose reference range applies only to samples taken after fasting for at least 8 hours.   BUN 12/20/2021 14  8 - 23 mg/dL Final   Creatinine, Ser 12/20/2021 0.89  0.44 - 1.00 mg/dL Final   Calcium 12/20/2021 9.4  8.9 - 10.3 mg/dL Final   Total Protein 12/20/2021 7.6  6.5 - 8.1 g/dL Final   Albumin 12/20/2021 3.5  3.5 - 5.0 g/dL Final   AST 12/20/2021 15  15 - 41 U/L Final   ALT 12/20/2021 13  0 - 44 U/L Final   Alkaline Phosphatase 12/20/2021 42  38 - 126 U/L Final   Total Bilirubin 12/20/2021 0.2 (L)  0.3 - 1.2 mg/dL Final   GFR, Estimated 12/20/2021 >60  >60 mL/min Final   Comment: (NOTE) Calculated using the CKD-EPI Creatinine Equation (2021)    Anion gap 12/20/2021 8  5 - 15 Final   Performed at [facility], [address] [place], [place] [address]    Assessment and plan:   1. Encounter for antineoplastic chemotherapy   2. Essential thrombocythemia ([place])   3. B12 deficiency     #  Essential thrombocytosis- JAK2 was positive for V617F. 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  Labs reviewed and discussed with  patient.  Counts are stable Continue hydroxyurea 1000 mg 4 days/week and 500 mg 3 days/week.   Refills of hydroxyurea was sent.   # History of vitamin B12 deficiency, continue oral B12.  B12 level is stable at 761.  Follow up 3 mo lab (cbc, cmp) MD  I discussed the assessment and treatment plan with the patient.  The patient was provided an opportunity to ask questions and all were answered.  The patient agreed with the plan and demonstrated an understanding of the instructions.  The patient was advised to call back if the symptoms worsen or if the condition fails to improve as anticipated.  Earlie Server, MD, PhD Hematology Oncology  12/20/2021

## 2022-04-23 ENCOUNTER — Encounter: Payer: Self-pay | Admitting: Oncology

## 2022-06-19 ENCOUNTER — Other Ambulatory Visit: Payer: Medicare HMO

## 2022-06-19 ENCOUNTER — Ambulatory Visit: Payer: Medicare HMO | Admitting: Oncology

## 2022-06-26 ENCOUNTER — Encounter: Payer: Self-pay | Admitting: Oncology

## 2022-06-26 ENCOUNTER — Inpatient Hospital Stay: Payer: Medicare HMO | Admitting: Oncology

## 2022-06-26 ENCOUNTER — Inpatient Hospital Stay: Payer: Medicare HMO | Attending: Oncology

## 2022-06-26 DIAGNOSIS — Z823 Family history of stroke: Secondary | ICD-10-CM | POA: Insufficient documentation

## 2022-06-26 DIAGNOSIS — Z5112 Encounter for antineoplastic immunotherapy: Secondary | ICD-10-CM | POA: Diagnosis not present

## 2022-06-26 DIAGNOSIS — Z8269 Family history of other diseases of the musculoskeletal system and connective tissue: Secondary | ICD-10-CM | POA: Insufficient documentation

## 2022-06-26 DIAGNOSIS — Z862 Personal history of diseases of the blood and blood-forming organs and certain disorders involving the immune mechanism: Secondary | ICD-10-CM | POA: Diagnosis not present

## 2022-06-26 DIAGNOSIS — Z832 Family history of diseases of the blood and blood-forming organs and certain disorders involving the immune mechanism: Secondary | ICD-10-CM | POA: Diagnosis not present

## 2022-06-26 DIAGNOSIS — D473 Essential (hemorrhagic) thrombocythemia: Secondary | ICD-10-CM | POA: Insufficient documentation

## 2022-06-26 DIAGNOSIS — Z880 Allergy status to penicillin: Secondary | ICD-10-CM | POA: Insufficient documentation

## 2022-06-26 DIAGNOSIS — Z833 Family history of diabetes mellitus: Secondary | ICD-10-CM | POA: Insufficient documentation

## 2022-06-26 DIAGNOSIS — Z818 Family history of other mental and behavioral disorders: Secondary | ICD-10-CM | POA: Diagnosis not present

## 2022-06-26 DIAGNOSIS — Z79899 Other long term (current) drug therapy: Secondary | ICD-10-CM | POA: Insufficient documentation

## 2022-06-26 DIAGNOSIS — Z8249 Family history of ischemic heart disease and other diseases of the circulatory system: Secondary | ICD-10-CM | POA: Diagnosis not present

## 2022-06-26 DIAGNOSIS — D75839 Thrombocytosis, unspecified: Secondary | ICD-10-CM | POA: Insufficient documentation

## 2022-06-26 LAB — CBC WITH DIFFERENTIAL/PLATELET
Abs Immature Granulocytes: 0.02 10*3/uL (ref 0.00–0.07)
Basophils Absolute: 0 10*3/uL (ref 0.0–0.1)
Basophils Relative: 1 %
Eosinophils Absolute: 0.1 10*3/uL (ref 0.0–0.5)
Eosinophils Relative: 1 %
HCT: 42.1 % (ref 36.0–46.0)
Hemoglobin: 13.7 g/dL (ref 12.0–15.0)
Immature Granulocytes: 0 %
Lymphocytes Relative: 28 %
Lymphs Abs: 2.1 10*3/uL (ref 0.7–4.0)
MCH: 34.9 pg — ABNORMAL HIGH (ref 26.0–34.0)
MCHC: 32.5 g/dL (ref 30.0–36.0)
MCV: 107.1 fL — ABNORMAL HIGH (ref 80.0–100.0)
Monocytes Absolute: 0.9 10*3/uL (ref 0.1–1.0)
Monocytes Relative: 12 %
Neutro Abs: 4.3 10*3/uL (ref 1.7–7.7)
Neutrophils Relative %: 58 %
Platelets: 357 10*3/uL (ref 150–400)
RBC: 3.93 MIL/uL (ref 3.87–5.11)
RDW: 13.5 % (ref 11.5–15.5)
WBC: 7.3 10*3/uL (ref 4.0–10.5)
nRBC: 0 % (ref 0.0–0.2)

## 2022-06-26 LAB — COMPREHENSIVE METABOLIC PANEL
ALT: 12 U/L (ref 0–44)
AST: 16 U/L (ref 15–41)
Albumin: 3.5 g/dL (ref 3.5–5.0)
Alkaline Phosphatase: 43 U/L (ref 38–126)
Anion gap: 5 (ref 5–15)
BUN: 12 mg/dL (ref 8–23)
CO2: 27 mmol/L (ref 22–32)
Calcium: 8.8 mg/dL — ABNORMAL LOW (ref 8.9–10.3)
Chloride: 105 mmol/L (ref 98–111)
Creatinine, Ser: 0.9 mg/dL (ref 0.44–1.00)
GFR, Estimated: >60 mL/min (ref >60)
Glucose, Bld: 92 mg/dL (ref 70–99)
Potassium: 3.7 mmol/L (ref 3.5–5.1)
Sodium: 137 mmol/L (ref 135–145)
Total Bilirubin: 0.7 mg/dL (ref 0.3–1.2)
Total Protein: 7.4 g/dL (ref 6.5–8.1)

## 2022-06-26 MED ORDER — HYDROXYUREA 500 MG PO CAPS: ORAL_CAPSULE | ORAL | 1 refills | Status: DC

## 2022-06-27 DIAGNOSIS — Z862 Personal history of diseases of the blood and blood-forming organs and certain disorders involving the immune mechanism: Secondary | ICD-10-CM | POA: Insufficient documentation

## 2022-06-27 DIAGNOSIS — Z5112 Encounter for antineoplastic immunotherapy: Secondary | ICD-10-CM | POA: Insufficient documentation

## 2022-06-27 DIAGNOSIS — Z5111 Encounter for antineoplastic chemotherapy: Secondary | ICD-10-CM | POA: Insufficient documentation

## 2022-06-27 NOTE — Assessment & Plan Note (Signed)
Continue monitor B12 levels.

## 2022-06-27 NOTE — Assessment & Plan Note (Signed)
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  Labs are reviewed and discussed with patient. Stable platelets Continue same regimen, Refills sent  hydroxyurea 1000 mg 4 days/week and 500 mg 3 days/week.  Refills of hydroxyurea was sent. 

## 2022-06-27 NOTE — Assessment & Plan Note (Signed)
Treatment plan as listed above. 

## 2022-06-27 NOTE — Progress Notes (Signed)
Hematology/Oncology Progress note [phone] [phone]      Clinic Day:  06/27/2022  ASSESSMENT & PLAN:   Encounter for antineoplastic immunotherapy Treatment plan as listed above.   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  Labs are reviewed and discussed with patient. Stable platelets Continue same regimen, Refills sent  hydroxyurea 1000 mg 4 days/week and 500 mg 3 days/week.  Refills of hydroxyurea was sent.  History of anemia due to vitamin B12 deficiency Continue monitor B12 levels.    Orders Placed This Encounter  Procedures   CBC with Differential/Platelet    Standing Status:   Future    Standing Expiration Date:   06/27/2023   CBC with Differential/Platelet    Standing Status:   Future    Standing Expiration Date:   06/27/2023   Comprehensive metabolic panel    Standing Status:   Future    Standing Expiration Date:   06/27/2023   Vitamin B12    Standing Status:   Future    Standing Expiration Date:   06/27/2023   Follow up  3 months lab cbc  6 months, lab MD cbc cmp B12  All questions were answered. The patient knows to call the clinic with any problems, questions or concerns.  Earlie Server, MD, PhD [facility] Hematology Oncology 06/26/2022    Chief Complaint: Molly Perez is a 76 y.o. female presents for follow up of  essential thrombocythemia (ET)   PERTINENT ONCOLOGY HISTORY Molly Perez is a 76 y.o.afemale who has above oncology history reviewed by me today presented for follow up visit for management of ET Patient previously followed up by Dr.Corcoran, patient switched care to me on 06/20/21 Extensive medical record review was performed by me  11/05/2016. JAK2 V617F + 12/09/2016  Bone marrow aspirate and biopsy revealed a myeloproliferative neoplasm best classified as essential  thrombocythemia (ET).  Marrow was normocellular to mildy hypercellular for age (40-50%) with trilineage hematopoiesis including a proliferation of atypical megakaryocytes with focal clusters and no increase in blasts.  There was no significant increase in marrow reticulin fibers.  Storage iron was present.  Cytogenetics were normal (20, XX).  Flow cytometry was negative.  FISH studies for MPN/CML were negative.  # 12/21/2016 Started on Hydroxyurea    Interval History:  Patient presents for follow-up of management of essential thrombocythemia. She takes hydroxyurea as instructed.   She reports feeling well.  No new complaints.   Past Medical History:  Diagnosis Date   Cataract cortical, senile    GERD (gastroesophageal reflux disease)    Hypercholesterolemia    Obesity    Pre-diabetes    Prediabetes    Thrombocytopenia (HCC)    Vitamin D deficiency     Past Surgical History:  Procedure Laterality Date   ABDOMINAL HYSTERECTOMY     COLONOSCOPY     COLONOSCOPY WITH PROPOFOL N/A 07/04/2016   Procedure: COLONOSCOPY WITH PROPOFOL;  Surgeon: Lollie Sails, MD;  Location: [facility] ENDOSCOPY;  Service: Endoscopy;  Laterality: N/A;   ESOPHAGOGASTRODUODENOSCOPY (EGD) WITH PROPOFOL N/A 07/04/2016   Procedure: ESOPHAGOGASTRODUODENOSCOPY (EGD) WITH PROPOFOL;  Surgeon: Lollie Sails, MD;  Location: [facility] ENDOSCOPY;  Service: Endoscopy;  Laterality: N/A;    Family History  Problem Relation Age of Onset   Alzheimer's disease Father    Congestive Heart Failure Mother    Diabetes Mother    Multiple sclerosis Brother    Lupus  Grandchild    Stroke Grandchild    Breast cancer Neg Hx     Social History:  reports that she has never smoked. Her smokeless tobacco use includes snuff. She reports that she does not drink alcohol and does not use drugs. lives in [place].  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  She retired at age 48.  She worked for [employer].  She  denies any exposure to radiation, toxins, and smoke.The patient is alone today.  Allergies:  Allergies  Allergen Reactions   Amoxicillin Other (See Comments)   Lipitor [Atorvastatin] Other (See Comments)    Weakness in legs Weakness in legs   Penicillins Other (See Comments)    Pt states medication upsets her stomach    Current Medications: Current Outpatient Medications  Medication Sig Dispense Refill   acetaminophen (TYLENOL) 500 MG tablet Take 500 mg by mouth every 8 (eight) hours as needed for moderate pain.      amLODipine (NORVASC) 5 MG tablet Take 5 mg by mouth daily.     aspirin EC 81 MG tablet Take 81 mg by mouth daily.     baclofen (LIORESAL) 10 MG tablet Take 1 tablet (10 mg total) by mouth 3 (three) times daily. 30 each 0   Calcium Carbonate-Vitamin D 600-400 MG-UNIT tablet Take 2 tablets by mouth daily.     Cholecalciferol (VITAMIN D3) 5000 UNITS CAPS Take 2,000 Units by mouth daily.      pantoprazole (PROTONIX) 40 MG tablet Take 40 mg by mouth daily.     pravastatin (PRAVACHOL) 80 MG tablet Take 80 mg by mouth daily.      vitamin B-12 (CYANOCOBALAMIN) 1000 MCG tablet Take 1 tablet (1,000 mcg total) by mouth daily. 30 tablet 0   hydroxyurea (HYDREA) 500 MG capsule Take 2 capsules ('1000mg'$ ) on Monday, Wednesday, Friday, Saturday. Take 1 capsule (500 mg) on Tuesday, Thursday, and Sunday. 132 capsule 1   ondansetron (ZOFRAN) 8 MG tablet Take 8 mg by mouth every 8 (eight) hours as needed. (Patient not taking: Reported on 09/19/2021)     potassium chloride (KLOR-CON) 10 MEQ tablet Take 1 tablet (10 mEq total) by mouth daily for 4 days. (Patient not taking: Reported on 09/19/2021) 4 tablet 0   No current facility-administered medications for this visit.    Review of Systems  Constitutional:  Negative for chills, fever, malaise/fatigue and weight loss.  HENT:  Negative for hearing loss, nosebleeds, sore throat and tinnitus.   Eyes:  Negative for blurred vision and double  vision.  Respiratory:  Negative for cough, hemoptysis, shortness of breath and wheezing.   Cardiovascular:  Negative for chest pain, palpitations and leg swelling.  Gastrointestinal:  Negative for abdominal pain, blood in stool, constipation, diarrhea, melena, nausea and vomiting.  Genitourinary:  Negative for dysuria and urgency.  Musculoskeletal:  Negative for back pain, falls, joint pain and myalgias.  Skin:  Negative for itching and rash.  Neurological:  Negative for dizziness, tingling, sensory change, loss of consciousness, weakness and headaches.  Endo/Heme/Allergies:  Negative for environmental allergies. Does not bruise/bleed easily.  Psychiatric/Behavioral:  Negative for depression. The patient is not nervous/anxious and does not have insomnia.    Performance status (ECOG): 1  Vitals Blood pressure 138/81, pulse 72, temperature 98.7 F (37.1 C), resp. rate 19, weight 217 lb 1.6 oz (98.5 kg), SpO2 98 %.   Physical Exam Constitutional:      General: She is not in acute distress.  Appearance: She is not diaphoretic.  HENT:     Head: Normocephalic and atraumatic.     Nose: Nose normal.     Mouth/Throat:     Pharynx: No oropharyngeal exudate.  Eyes:     General: No scleral icterus.    Pupils: Pupils are equal, round, and reactive to light.  Cardiovascular:     Rate and Rhythm: Normal rate and regular rhythm.     Heart sounds: No murmur heard. Pulmonary:     Effort: Pulmonary effort is normal. No respiratory distress.     Breath sounds: No rales.  Chest:     Chest wall: No tenderness.  Abdominal:     General: There is no distension.     Palpations: Abdomen is soft.     Tenderness: There is no abdominal tenderness.  Musculoskeletal:        General: Normal range of motion.     Cervical back: Normal range of motion and neck supple.  Skin:    General: Skin is warm and dry.     Findings: No erythema.  Neurological:     Mental Status: She is alert and oriented to  person, place, and time.     Cranial Nerves: No cranial nerve deficit.     Motor: No abnormal muscle tone.     Coordination: Coordination normal.  Psychiatric:        Mood and Affect: Affect normal.       Appointment on 06/26/2022  Component Date Value Ref Range Status   Sodium 06/26/2022 137  135 - 145 mmol/L Final   Potassium 06/26/2022 3.7  3.5 - 5.1 mmol/L Final   Chloride 06/26/2022 105  98 - 111 mmol/L Final   CO2 06/26/2022 27  22 - 32 mmol/L Final   Glucose, Bld 06/26/2022 92  70 - 99 mg/dL Final   Glucose reference range applies only to samples taken after fasting for at least 8 hours.   BUN 06/26/2022 12  8 - 23 mg/dL Final   Creatinine, Ser 06/26/2022 0.90  0.44 - 1.00 mg/dL Final   Calcium 06/26/2022 8.8 (L)  8.9 - 10.3 mg/dL Final   Total Protein 06/26/2022 7.4  6.5 - 8.1 g/dL Final   Albumin 06/26/2022 3.5  3.5 - 5.0 g/dL Final   AST 06/26/2022 16  15 - 41 U/L Final   ALT 06/26/2022 12  0 - 44 U/L Final   Alkaline Phosphatase 06/26/2022 43  38 - 126 U/L Final   Total Bilirubin 06/26/2022 0.7  0.3 - 1.2 mg/dL Final   GFR, Estimated 06/26/2022 >60  >60 mL/min Final   Comment: (NOTE) Calculated using the CKD-EPI Creatinine Equation (2021)    Anion gap 06/26/2022 5  5 - 15 Final   Performed at [facility], [place], [place] [address]   WBC 06/26/2022 7.3  4.0 - 10.5 K/uL Final   RBC 06/26/2022 3.93  3.87 - 5.11 MIL/uL Final   Hemoglobin 06/26/2022 13.7  12.0 - 15.0 g/dL Final   HCT 06/26/2022 42.1  36.0 - 46.0 % Final   MCV 06/26/2022 107.1 (H)  80.0 - 100.0 fL Final   MCH 06/26/2022 34.9 (H)  26.0 - 34.0 pg Final   MCHC 06/26/2022 32.5  30.0 - 36.0 g/dL Final   RDW 06/26/2022 13.5  11.5 - 15.5 % Final   Platelets 06/26/2022 357  150 - 400 K/uL Final   nRBC 06/26/2022 0.0  0.0 - 0.2 % Final   Neutrophils Relative %  06/26/2022 58  % Final   Neutro Abs 06/26/2022 4.3  1.7 - 7.7 K/uL Final   Lymphocytes Relative 06/26/2022 28  % Final    Lymphs Abs 06/26/2022 2.1  0.7 - 4.0 K/uL Final   Monocytes Relative 06/26/2022 12  % Final   Monocytes Absolute 06/26/2022 0.9  0.1 - 1.0 K/uL Final   Eosinophils Relative 06/26/2022 1  % Final   Eosinophils Absolute 06/26/2022 0.1  0.0 - 0.5 K/uL Final   Basophils Relative 06/26/2022 1  % Final   Basophils Absolute 06/26/2022 0.0  0.0 - 0.1 K/uL Final   Immature Granulocytes 06/26/2022 0  % Final   Abs Immature Granulocytes 06/26/2022 0.02  0.00 - 0.07 K/uL Final   Performed at [facility], [address] [place],  [address]

## 2022-07-18 ENCOUNTER — Other Ambulatory Visit: Payer: Self-pay | Admitting: Pediatrics

## 2022-07-18 DIAGNOSIS — Z1231 Encounter for screening mammogram for malignant neoplasm of breast: Secondary | ICD-10-CM

## 2022-08-25 ENCOUNTER — Ambulatory Visit
Admission: RE | Admit: 2022-08-25 | Discharge: 2022-08-25 | Disposition: A | Discharge status: Home / Self Care | Payer: Medicare HMO | Source: Ambulatory Visit | Attending: Pediatrics | Admitting: Pediatrics

## 2022-08-25 DIAGNOSIS — Z1231 Encounter for screening mammogram for malignant neoplasm of breast: Secondary | ICD-10-CM | POA: Diagnosis present

## 2022-09-23 ENCOUNTER — Inpatient Hospital Stay: Payer: Medicare HMO | Attending: Oncology

## 2022-09-23 DIAGNOSIS — D473 Essential (hemorrhagic) thrombocythemia: Secondary | ICD-10-CM | POA: Insufficient documentation

## 2022-09-23 LAB — CBC WITH DIFFERENTIAL/PLATELET
Abs Immature Granulocytes: 0.03 10*3/uL (ref 0.00–0.07)
Basophils Absolute: 0 10*3/uL (ref 0.0–0.1)
Basophils Relative: 1 %
Eosinophils Absolute: 0.1 10*3/uL (ref 0.0–0.5)
Eosinophils Relative: 1 %
HCT: 39.7 % (ref 36.0–46.0)
Hemoglobin: 13 g/dL (ref 12.0–15.0)
Immature Granulocytes: 0 %
Lymphocytes Relative: 34 %
Lymphs Abs: 2.5 10*3/uL (ref 0.7–4.0)
MCH: 34.9 pg — ABNORMAL HIGH (ref 26.0–34.0)
MCHC: 32.7 g/dL (ref 30.0–36.0)
MCV: 106.7 fL — ABNORMAL HIGH (ref 80.0–100.0)
Monocytes Absolute: 0.7 10*3/uL (ref 0.1–1.0)
Monocytes Relative: 9 %
Neutro Abs: 4 10*3/uL (ref 1.7–7.7)
Neutrophils Relative %: 55 %
Platelets: 361 10*3/uL (ref 150–400)
RBC: 3.72 MIL/uL — ABNORMAL LOW (ref 3.87–5.11)
RDW: 13.3 % (ref 11.5–15.5)
WBC: 7.4 10*3/uL (ref 4.0–10.5)
nRBC: 0 % (ref 0.0–0.2)

## 2022-12-29 ENCOUNTER — Encounter: Payer: Self-pay | Admitting: Oncology

## 2022-12-29 ENCOUNTER — Inpatient Hospital Stay (HOSPITAL_BASED_OUTPATIENT_CLINIC_OR_DEPARTMENT_OTHER): Payer: Medicare HMO | Admitting: Oncology

## 2022-12-29 ENCOUNTER — Inpatient Hospital Stay: Payer: Medicare HMO | Attending: Oncology

## 2022-12-29 VITALS — BP 135/70 | HR 71 | Temp 96.8°F | Resp 18 | Wt 220.0 lb

## 2022-12-29 DIAGNOSIS — D473 Essential (hemorrhagic) thrombocythemia: Secondary | ICD-10-CM | POA: Insufficient documentation

## 2022-12-29 DIAGNOSIS — Z8269 Family history of other diseases of the musculoskeletal system and connective tissue: Secondary | ICD-10-CM | POA: Diagnosis not present

## 2022-12-29 DIAGNOSIS — Z823 Family history of stroke: Secondary | ICD-10-CM | POA: Insufficient documentation

## 2022-12-29 DIAGNOSIS — Z880 Allergy status to penicillin: Secondary | ICD-10-CM | POA: Insufficient documentation

## 2022-12-29 DIAGNOSIS — Z862 Personal history of diseases of the blood and blood-forming organs and certain disorders involving the immune mechanism: Secondary | ICD-10-CM

## 2022-12-29 DIAGNOSIS — Z818 Family history of other mental and behavioral disorders: Secondary | ICD-10-CM | POA: Diagnosis not present

## 2022-12-29 DIAGNOSIS — D75839 Thrombocytosis, unspecified: Secondary | ICD-10-CM | POA: Insufficient documentation

## 2022-12-29 DIAGNOSIS — Z833 Family history of diabetes mellitus: Secondary | ICD-10-CM | POA: Insufficient documentation

## 2022-12-29 DIAGNOSIS — Z832 Family history of diseases of the blood and blood-forming organs and certain disorders involving the immune mechanism: Secondary | ICD-10-CM | POA: Diagnosis not present

## 2022-12-29 DIAGNOSIS — Z8249 Family history of ischemic heart disease and other diseases of the circulatory system: Secondary | ICD-10-CM | POA: Insufficient documentation

## 2022-12-29 DIAGNOSIS — Z5111 Encounter for antineoplastic chemotherapy: Secondary | ICD-10-CM

## 2022-12-29 DIAGNOSIS — Z90710 Acquired absence of both cervix and uterus: Secondary | ICD-10-CM | POA: Diagnosis not present

## 2022-12-29 DIAGNOSIS — Z79899 Other long term (current) drug therapy: Secondary | ICD-10-CM | POA: Diagnosis not present

## 2022-12-29 LAB — COMPREHENSIVE METABOLIC PANEL
ALT: 14 U/L (ref 0–44)
AST: 17 U/L (ref 15–41)
Albumin: 3.4 g/dL — ABNORMAL LOW (ref 3.5–5.0)
Alkaline Phosphatase: 42 U/L (ref 38–126)
Anion gap: 8 (ref 5–15)
BUN: 9 mg/dL (ref 8–23)
CO2: 25 mmol/L (ref 22–32)
Calcium: 9.1 mg/dL (ref 8.9–10.3)
Chloride: 102 mmol/L (ref 98–111)
Creatinine, Ser: 0.95 mg/dL (ref 0.44–1.00)
GFR, Estimated: >60 mL/min (ref >60)
Glucose, Bld: 93 mg/dL (ref 70–99)
Potassium: 3.8 mmol/L (ref 3.5–5.1)
Sodium: 135 mmol/L (ref 135–145)
Total Bilirubin: 0.7 mg/dL (ref 0.3–1.2)
Total Protein: 7.8 g/dL (ref 6.5–8.1)

## 2022-12-29 LAB — VITAMIN B12: Vitamin B-12: 972 pg/mL — ABNORMAL HIGH (ref 180–914)

## 2022-12-29 LAB — CBC WITH DIFFERENTIAL/PLATELET
Abs Immature Granulocytes: 0.03 10*3/uL (ref 0.00–0.07)
Basophils Absolute: 0 10*3/uL (ref 0.0–0.1)
Basophils Relative: 0 %
Eosinophils Absolute: 0.1 10*3/uL (ref 0.0–0.5)
Eosinophils Relative: 1 %
HCT: 41.3 % (ref 36.0–46.0)
Hemoglobin: 13.3 g/dL (ref 12.0–15.0)
Immature Granulocytes: 0 %
Lymphocytes Relative: 28 %
Lymphs Abs: 2.5 10*3/uL (ref 0.7–4.0)
MCH: 34.2 pg — ABNORMAL HIGH (ref 26.0–34.0)
MCHC: 32.2 g/dL (ref 30.0–36.0)
MCV: 106.2 fL — ABNORMAL HIGH (ref 80.0–100.0)
Monocytes Absolute: 0.9 10*3/uL (ref 0.1–1.0)
Monocytes Relative: 10 %
Neutro Abs: 5.4 10*3/uL (ref 1.7–7.7)
Neutrophils Relative %: 61 %
Platelets: 384 10*3/uL (ref 150–400)
RBC: 3.89 MIL/uL (ref 3.87–5.11)
RDW: 13.7 % (ref 11.5–15.5)
WBC: 9 10*3/uL (ref 4.0–10.5)
nRBC: 0 % (ref 0.0–0.2)

## 2022-12-29 MED ORDER — HYDROXYUREA 500 MG PO CAPS: ORAL_CAPSULE | ORAL | 1 refills | Status: DC

## 2022-12-29 NOTE — Progress Notes (Signed)
Hematology/Oncology Progress note [phone] [phone]   Chief Complaint:  Molly Perez is a 77 y.o. 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  Labs are reviewed and discussed with patient. Stable platelets Continue same regimen, Refills sent  hydroxyurea 1000 mg 4 days/week and 500 mg 3 days/week.  Refills of hydroxyurea was sent.  Encounter for antineoplastic chemotherapy Treatment plan as listed above.   History of anemia due to vitamin B12 deficiency Continue monitor  B12 level is pending.     Orders Placed This Encounter  Procedures   CBC with Differential ([place] Only)    Standing Status:   Future    Standing Expiration Date:   12/30/2023   CBC with Differential ([facility] Only)    Standing Status:   Future    Standing Expiration Date:   12/30/2023   CMP ([place] only)    Standing Status:   Future    Standing Expiration Date:   12/30/2023   Vitamin B12    Standing Status:   Future    Standing Expiration Date:   12/30/2023   Follow up  3 months lab cbc  6 months, lab MD cbc cmp B12  All questions were answered. The patient knows to call the clinic with any problems, questions or concerns.  Molly Server, MD, PhD Molly Perez [facility] Hematology Oncology 12/29/2022      PERTINENT ONCOLOGY HISTORY Molly Perez is a 77 y.o.afemale who has above oncology history reviewed by me today presented for follow up visit for management of ET Patient previously followed up by Dr.Corcoran, patient switched care to me on 06/20/21 Extensive medical record review was performed by me  11/05/2016. JAK2 V617F + 12/09/2016  Bone marrow aspirate and biopsy revealed a myeloproliferative neoplasm best classified as essential  thrombocythemia (ET).  Marrow was normocellular to mildy hypercellular for age (40-50%) with trilineage hematopoiesis including a proliferation of atypical megakaryocytes with focal clusters and no increase in blasts.  There was no significant increase in marrow reticulin fibers.  Storage iron was present.  Cytogenetics were normal (85, XX).  Flow cytometry was negative.  FISH studies for MPN/CML were negative.  # 12/21/2016 Started on Hydroxyurea    Interval History:  Patient presents for follow-up of management of essential thrombocythemia. She takes hydroxyurea as instructed.   She reports feeling well.   No new complaints.   Past Medical History:  Diagnosis Date   Cataract cortical, senile    GERD (gastroesophageal reflux disease)    Hypercholesterolemia    Obesity    Pre-diabetes    Prediabetes    Thrombocytopenia (HCC)    Vitamin D deficiency     Past Surgical History:  Procedure Laterality Date   ABDOMINAL HYSTERECTOMY     COLONOSCOPY     COLONOSCOPY WITH PROPOFOL N/A 07/04/2016   Procedure: COLONOSCOPY WITH PROPOFOL;  Surgeon: Molly Sails, MD;  Location: [facility] ENDOSCOPY;  Service: Endoscopy;  Laterality: N/A;   ESOPHAGOGASTRODUODENOSCOPY (EGD) WITH PROPOFOL N/A 07/04/2016   Procedure: ESOPHAGOGASTRODUODENOSCOPY (EGD) WITH PROPOFOL;  Surgeon: Molly Sails, MD;  Location: [facility] ENDOSCOPY;  Service: Endoscopy;  Laterality: N/A;    Family History  Problem Relation Age of Onset   Alzheimer's disease Father    Congestive Heart Failure Mother    Diabetes Mother  Multiple sclerosis Brother    Lupus Grandchild    Stroke Grandchild    Breast cancer Neg Hx     Social History:  reports that she has never smoked. Her smokeless tobacco use includes snuff. She reports that she does not drink alcohol and does not use drugs. lives in [name].   She retired at age 72.  She worked for [employer].  She denies any exposure to radiation, toxins, and  smoke.The patient is alone today.  Allergies:  Allergies  Allergen Reactions   Amoxicillin Other (See Comments)   Lipitor [Atorvastatin] Other (See Comments)    Weakness in legs Weakness in legs   Penicillins Other (See Comments)    Pt states medication upsets her stomach    Current Medications: Current Outpatient Medications  Medication Sig Dispense Refill   acetaminophen (TYLENOL) 500 MG tablet Take 500 mg by mouth every 8 (eight) hours as needed for moderate pain.      amLODipine (NORVASC) 5 MG tablet Take 5 mg by mouth daily.     aspirin EC 81 MG tablet Take 81 mg by mouth daily.     baclofen (LIORESAL) 10 MG tablet Take 1 tablet (10 mg total) by mouth 3 (three) times daily. 30 each 0   Calcium Carbonate-Vitamin D 600-400 MG-UNIT tablet Take 2 tablets by mouth daily.     Cholecalciferol (VITAMIN D3) 5000 UNITS CAPS Take 2,000 Units by mouth daily.      pantoprazole (PROTONIX) 40 MG tablet Take 40 mg by mouth daily.     pravastatin (PRAVACHOL) 80 MG tablet Take 80 mg by mouth daily.      vitamin B-12 (CYANOCOBALAMIN) 1000 MCG tablet Take 1 tablet (1,000 mcg total) by mouth daily. 30 tablet 0   hydroxyurea (HYDREA) 500 MG capsule Take 2 capsules ('1000mg'$ ) on Monday, Wednesday, Friday, Saturday. Take 1 capsule (500 mg) on Tuesday, Thursday, and Sunday. 132 capsule 1   ondansetron (ZOFRAN) 8 MG tablet Take 8 mg by mouth every 8 (eight) hours as needed. (Patient not taking: Reported on 09/19/2021)     potassium chloride (KLOR-CON) 10 MEQ tablet Take 1 tablet (10 mEq total) by mouth daily for 4 days. (Patient not taking: Reported on 09/19/2021) 4 tablet 0   No current facility-administered medications for this visit.    Review of Systems  Constitutional:  Negative for chills, fever, malaise/fatigue and weight loss.  HENT:  Negative for hearing loss, nosebleeds, sore throat and tinnitus.   Eyes:  Negative for blurred vision and double vision.  Respiratory:  Negative for cough,  hemoptysis, shortness of breath and wheezing.   Cardiovascular:  Negative for chest pain, palpitations and leg swelling.  Gastrointestinal:  Negative for abdominal pain, blood in stool, constipation, diarrhea, melena, nausea and vomiting.  Genitourinary:  Negative for dysuria and urgency.  Musculoskeletal:  Negative for back pain, falls, joint pain and myalgias.  Skin:  Negative for itching and rash.  Neurological:  Negative for dizziness, tingling, sensory change, loss of consciousness, weakness and headaches.  Endo/Heme/Allergies:  Negative for environmental allergies. Does not bruise/bleed easily.  Psychiatric/Behavioral:  Negative for depression. The patient is not nervous/anxious and does not have insomnia.    Performance status (ECOG): 1  Vitals Blood pressure 135/70, pulse 71, temperature (!) 96.8 F (36 C), temperature source Tympanic, resp. rate 18, weight 220 lb (99.8 kg), SpO2 97 %.   Physical Exam Constitutional:      General: She is not in acute distress.  Appearance: She is not diaphoretic.  HENT:     Head: Normocephalic and atraumatic.     Nose: Nose normal.     Mouth/Throat:     Pharynx: No oropharyngeal exudate.  Eyes:     General: No scleral icterus.    Pupils: Pupils are equal, round, and reactive to light.  Cardiovascular:     Rate and Rhythm: Normal rate and regular rhythm.     Heart sounds: No murmur heard. Pulmonary:     Effort: Pulmonary effort is normal. No respiratory distress.     Breath sounds: No rales.  Chest:     Chest wall: No tenderness.  Abdominal:     General: There is no distension.     Palpations: Abdomen is soft.     Tenderness: There is no abdominal tenderness.  Musculoskeletal:        General: Normal range of motion.     Cervical back: Normal range of motion and neck supple.  Skin:    General: Skin is warm and dry.     Findings: No erythema.  Neurological:     Mental Status: She is alert and oriented to person, place, and time.      Cranial Nerves: No cranial nerve deficit.     Motor: No abnormal muscle tone.     Coordination: Coordination normal.  Psychiatric:        Mood and Affect: Affect normal.      Labs.    Latest Ref Rng & Units 12/29/2022   10:16 AM 09/23/2022   10:26 AM 06/26/2022    9:59 AM  CBC  WBC 4.0 - 10.5 K/uL 9.0  7.4  7.3   Hemoglobin 12.0 - 15.0 g/dL 13.3  13.0  13.7   Hematocrit 36.0 - 46.0 % 41.3  39.7  42.1   Platelets 150 - 400 K/uL 384  361  357       Latest Ref Rng & Units 12/29/2022   10:16 AM 06/26/2022    9:59 AM 12/20/2021   11:04 AM  CMP  Glucose 70 - 99 mg/dL 93  92  97   BUN 8 - 23 mg/dL '9  12  14   '$ Creatinine 0.44 - 1.00 mg/dL 0.95  0.90  0.89   Sodium 135 - 145 mmol/L 135  137  136   Potassium 3.5 - 5.1 mmol/L 3.8  3.7  4.1   Chloride 98 - 111 mmol/L 102  105  100   CO2 22 - 32 mmol/L '25  27  28   '$ Calcium 8.9 - 10.3 mg/dL 9.1  8.8  9.4   Total Protein 6.5 - 8.1 g/dL 7.8  7.4  7.6   Total Bilirubin 0.3 - 1.2 mg/dL 0.7  0.7  0.2   Alkaline Phos 38 - 126 U/L 42  43  42   AST 15 - 41 U/L '17  16  15   '$ ALT 0 - 44 U/L 14  12  13

## 2022-12-29 NOTE — Assessment & Plan Note (Signed)
Treatment plan as listed above. 

## 2022-12-29 NOTE — Assessment & Plan Note (Signed)
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  Labs are reviewed and discussed with patient. Stable platelets Continue same regimen, Refills sent  hydroxyurea 1000 mg 4 days/week and 500 mg 3 days/week.  Refills of hydroxyurea was sent.

## 2022-12-29 NOTE — Assessment & Plan Note (Signed)
Continue monitor  B12 level is pending.

## 2023-01-08 ENCOUNTER — Ambulatory Visit
Admission: EM | Admit: 2023-01-08 | Discharge: 2023-01-08 | Disposition: A | Discharge status: Home / Self Care | Payer: Medicare HMO | Attending: Emergency Medicine | Admitting: Emergency Medicine

## 2023-01-08 DIAGNOSIS — H6002 Abscess of left external ear: Secondary | ICD-10-CM

## 2023-01-08 DIAGNOSIS — R051 Acute cough: Secondary | ICD-10-CM | POA: Diagnosis not present

## 2023-01-08 MED ORDER — PREDNISONE 20 MG PO TABS: 40.0000 mg | ORAL_TABLET | Freq: Every day | ORAL | 0 refills | Status: DC

## 2023-01-08 MED ORDER — BENZONATATE 100 MG PO CAPS: 100.0000 mg | ORAL_CAPSULE | Freq: Three times a day (TID) | ORAL | 0 refills | Status: DC

## 2023-01-08 MED ORDER — DOXYCYCLINE HYCLATE 100 MG PO CAPS: 100.0000 mg | ORAL_CAPSULE | Freq: Two times a day (BID) | ORAL | 0 refills | Status: DC

## 2023-01-08 NOTE — ED Triage Notes (Signed)
Left external ear pain x 2 days. Pt presents with a bump on tragus of ear. Pt also c/o cough that started in January. Denies fever.

## 2023-01-08 NOTE — Discharge Instructions (Signed)
For your cough - Symptoms have been present for 2 months and initially started with a viral illness will provide antibiotic to ensure that this does not prolong your symptoms, the same antibiotic you will be used to treat the infected area to your ear - Begin doxycycline every morning and every evening for 7 days, typically takes 48 hours for antibiotics to get into your system and should see improvement from there - You may begin prednisone every morning with food for 5 days to relax the airway, this should help with your cough and the wheezing that you are experiencing -May use Tessalon pill every 8 hours for additional comfort  For your ear -Take prescribed antibiotic to clear any infection -May hold warm compresses or heating pad over the affected area to soften the tissue and to help it drain, once it drains it typically will heal up without complication -May take Tylenol every 6 hours as needed for pain -  may follow-up with her [facility] doctor for reevaluation as needed

## 2023-01-08 NOTE — ED Provider Notes (Signed)
[facility]    CSN: [id number] Arrival date & time: 01/08/23  1003      History   Chief Complaint Chief Complaint  Patient presents with   Facial Swelling    External ear pain x 2 days.    HPI Molly Perez is a 77 y.o. female.   Persisting nonproductive cough present for 2 months.  Had a viral illness had a viral illness around the end of January, cough is only remaining symptoms.  Has begun to experience intermittent wheezing which she is unsure if related to her cough or her weight.  Cough is worsened when drinking water and endorses her husband says she coughs quite frequently.  Has not attempted treatment.  Tolerating food and liquids.  Denies shortness of breath, chest pain or tightness.  Denies respiratory history.  Non-smoker.    Patient concerned with a bump present to the left ear for 2 days.  Has increased in size and began to experience a stinging sensation.  Bump has been there for several months to a year prior, appeared to be a blackhead.  Has not attempted treatment.  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   Bursitis of left shoulder 07/30/2016   Elevated total protein 07/09/2016   MCL deficiency, 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   Internal derangement of right 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   Back pain, chronic 05/09/2015   Hypercholesteremia 05/09/2015   Obesity, Class II, BMI 35-39.9  05/09/2015   Prediabetes 05/09/2015   Avitaminosis D 05/09/2015    Past Surgical History:  Procedure Laterality Date   ABDOMINAL HYSTERECTOMY     COLONOSCOPY     COLONOSCOPY WITH PROPOFOL N/A 07/04/2016   Procedure: COLONOSCOPY WITH PROPOFOL;  Surgeon: Lollie Sails, MD;  Location[facility] ENDOSCOPY;  Service: Endoscopy;  Laterality: N/A;   ESOPHAGOGASTRODUODENOSCOPY (EGD) WITH PROPOFOL N/A 07/04/2016   Procedure: ESOPHAGOGASTRODUODENOSCOPY (EGD) WITH PROPOFOL;  Surgeon: Lollie Sails, MD;  Location: [facility] ENDOSCOPY;  Service: Endoscopy;  Laterality: N/A;    OB History   No obstetric history on file.      Home Medications    Prior to Admission medications   Medication Sig Start Date End Date Taking? Authorizing Provider  acetaminophen (TYLENOL) 500 MG tablet Take 500 mg by mouth every 8 (eight) hours as needed for moderate pain.     [provider]  amLODipine (NORVASC) 5 MG tablet Take 5 mg by mouth daily. 08/13/21   [provider]  aspirin EC 81 MG tablet Take 81 mg by mouth daily.    [provider]  baclofen (LIORESAL) 10 MG tablet Take 1 tablet (10 mg total) by mouth 3 (three) times daily. 08/05/21   Margarette Canada, NP  Calcium Carbonate-Vitamin D 600-400 MG-UNIT tablet Take 2 tablets by mouth daily.    [provider]  Cholecalciferol (VITAMIN D3) 5000 UNITS CAPS Take 2,000 Units by mouth daily.  03/29/15  [provider]  hydroxyurea (HYDREA) 500 MG capsule Take 2 capsules ('1000mg'$ ) on Monday, Wednesday, Friday, Saturday. Take 1 capsule (500 mg) on Tuesday, Thursday, and Sunday. 12/29/22   Earlie Server, MD  ondansetron (ZOFRAN) 8 MG tablet Take 8 mg by mouth every 8 (eight) hours as needed. Patient not taking: Reported on 09/19/2021 12/17/16   [provider]  pantoprazole (PROTONIX) 40 MG tablet Take 40 mg by mouth daily.    [provider]  potassium chloride (KLOR-CON) 10 MEQ tablet Take 1 tablet (10 mEq total) by  mouth daily for 4 days. Patient not taking: Reported on 09/19/2021 12/20/20 09/19/21  Lequita Asal, MD  pravastatin (PRAVACHOL) 80 MG tablet Take 80 mg by mouth daily.  07/08/16 04/14/38  [provider]  vitamin B-12 (CYANOCOBALAMIN) 1000 MCG tablet Take 1 tablet (1,000 mcg total) by mouth daily. 08/01/15   Plonk, Gwyndolyn Saxon, MD    Family History Family History  Problem Relation Age of Onset   Alzheimer's disease Father    Congestive Heart Failure Mother    Diabetes Mother    Multiple sclerosis Brother    Lupus Grandchild    Stroke Grandchild    Breast cancer Neg Hx     Social History Social History   Tobacco Use   Smoking status: Never   Smokeless tobacco: Current    Types: Snuff  Vaping Use   Vaping Use: Never used  Substance Use Topics   Alcohol use: No    Alcohol/week: 0.0 standard drinks of alcohol   Drug use: No     Allergies   Amoxicillin, Lipitor [atorvastatin], and Penicillins   Review of Systems Review of Systems   Physical Exam Triage Vital Signs ED Triage Vitals [01/08/23 1036]  Enc Vitals Group     BP 139/72     Pulse Rate 72     Resp 20     Temp 98.2 F (36.8 C)     Temp src      SpO2 100 %     Weight      Height      Head Circumference      Peak Flow      Pain Score 0     Pain Loc      Pain Edu?      Excl. in [place]?    No data found.  Updated Vital Signs BP 139/72   Pulse 72   Temp 98.2 F (36.8 C)   Resp 20   SpO2 100%   Visual Acuity Right Eye Distance:   Left Eye Distance:   Bilateral Distance:    Right Eye Near:   Left Eye Near:    Bilateral Near:     Physical Exam Constitutional:      Appearance: Normal appearance.  HENT:     Head:     Comments: 0.5 cm abscess present to the tragus of the left ear, erythematous and tender, nondraining Eyes:     Extraocular Movements: Extraocular movements intact.  Cardiovascular:     Rate and Rhythm: Normal rate and regular rhythm.     Pulses: Normal pulses.      Heart sounds: Normal heart sounds.  Pulmonary:     Effort: Pulmonary effort is normal.     Breath sounds: Normal breath sounds.  Neurological:     Mental Status: She is alert and oriented to person, place, and time. Mental status is at baseline.      UC Treatments / Results  Labs (all  labs ordered are listed, but only abnormal results are displayed) Labs Reviewed - No data to display  EKG   Radiology No results found.  Procedures Procedures (including critical care time)  Medications Ordered in UC Medications - No data to display  Initial Impression / Assessment and Plan / UC Course  I have reviewed the triage vital signs and the nursing notes.  Pertinent labs & imaging results that were available during my care of the patient were reviewed by me and considered in my medical decision making (see chart for details).  Acute cough, abscess of tragus of left ear  Doxycycline to cover for the respiratory system primarily the upper airways as well as abscess to the ear, discussed with patient, recommended warm compresses to the affected area for general comfort and to help facilitate drainage, may take Tylenol as needed for pain, prednisone and Tessalon additionally prescribed for management of wheezing, low suspicion for pneumonia at this time therefore imaging deferred, lungs are clear and O2 saturation is greater than 90%, may attempt use of additional over-the-counter medications or treatments as needed, may follow-up with her PCP or urgent care if symptoms persist or worsen Final Clinical Impressions(s) / UC Diagnoses   Final diagnoses:  None   Discharge Instructions   None    ED Prescriptions   None    PDMP not reviewed this encounter.   Hans Eden, [place] 01/08/23 [phone]

## 2023-03-31 ENCOUNTER — Inpatient Hospital Stay: Payer: Medicare HMO | Attending: Oncology

## 2023-03-31 DIAGNOSIS — D473 Essential (hemorrhagic) thrombocythemia: Secondary | ICD-10-CM | POA: Diagnosis present

## 2023-03-31 LAB — CBC WITH DIFFERENTIAL (CANCER CENTER ONLY)
Abs Immature Granulocytes: 0.02 10*3/uL (ref 0.00–0.07)
Basophils Absolute: 0.1 10*3/uL (ref 0.0–0.1)
Basophils Relative: 1 %
Eosinophils Absolute: 0.1 10*3/uL (ref 0.0–0.5)
Eosinophils Relative: 1 %
HCT: 38.7 % (ref 36.0–46.0)
Hemoglobin: 12.8 g/dL (ref 12.0–15.0)
Immature Granulocytes: 0 %
Lymphocytes Relative: 31 %
Lymphs Abs: 2.2 10*3/uL (ref 0.7–4.0)
MCH: 35.4 pg — ABNORMAL HIGH (ref 26.0–34.0)
MCHC: 33.1 g/dL (ref 30.0–36.0)
MCV: 106.9 fL — ABNORMAL HIGH (ref 80.0–100.0)
Monocytes Absolute: 0.7 10*3/uL (ref 0.1–1.0)
Monocytes Relative: 10 %
Neutro Abs: 4 10*3/uL (ref 1.7–7.7)
Neutrophils Relative %: 57 %
Platelet Count: 325 10*3/uL (ref 150–400)
RBC: 3.62 MIL/uL — ABNORMAL LOW (ref 3.87–5.11)
RDW: 13.2 % (ref 11.5–15.5)
WBC Count: 7 10*3/uL (ref 4.0–10.5)
nRBC: 0 % (ref 0.0–0.2)

## 2023-06-29 ENCOUNTER — Inpatient Hospital Stay (HOSPITAL_BASED_OUTPATIENT_CLINIC_OR_DEPARTMENT_OTHER): Payer: Medicare HMO | Admitting: Oncology

## 2023-06-29 ENCOUNTER — Encounter: Payer: Self-pay | Admitting: Oncology

## 2023-06-29 ENCOUNTER — Inpatient Hospital Stay: Payer: Medicare HMO | Attending: Oncology

## 2023-06-29 VITALS — BP 130/86 | HR 69 | Temp 96.7°F | Resp 18 | Wt 221.0 lb

## 2023-06-29 DIAGNOSIS — Z5111 Encounter for antineoplastic chemotherapy: Secondary | ICD-10-CM | POA: Diagnosis not present

## 2023-06-29 DIAGNOSIS — Z90710 Acquired absence of both cervix and uterus: Secondary | ICD-10-CM | POA: Insufficient documentation

## 2023-06-29 DIAGNOSIS — Z833 Family history of diabetes mellitus: Secondary | ICD-10-CM | POA: Insufficient documentation

## 2023-06-29 DIAGNOSIS — Z862 Personal history of diseases of the blood and blood-forming organs and certain disorders involving the immune mechanism: Secondary | ICD-10-CM

## 2023-06-29 DIAGNOSIS — Z880 Allergy status to penicillin: Secondary | ICD-10-CM | POA: Insufficient documentation

## 2023-06-29 DIAGNOSIS — Z8249 Family history of ischemic heart disease and other diseases of the circulatory system: Secondary | ICD-10-CM | POA: Insufficient documentation

## 2023-06-29 DIAGNOSIS — Z823 Family history of stroke: Secondary | ICD-10-CM | POA: Insufficient documentation

## 2023-06-29 DIAGNOSIS — Z832 Family history of diseases of the blood and blood-forming organs and certain disorders involving the immune mechanism: Secondary | ICD-10-CM | POA: Diagnosis not present

## 2023-06-29 DIAGNOSIS — D473 Essential (hemorrhagic) thrombocythemia: Secondary | ICD-10-CM

## 2023-06-29 DIAGNOSIS — Z818 Family history of other mental and behavioral disorders: Secondary | ICD-10-CM | POA: Diagnosis not present

## 2023-06-29 DIAGNOSIS — Z79899 Other long term (current) drug therapy: Secondary | ICD-10-CM | POA: Insufficient documentation

## 2023-06-29 DIAGNOSIS — D519 Vitamin B12 deficiency anemia, unspecified: Secondary | ICD-10-CM | POA: Diagnosis present

## 2023-06-29 DIAGNOSIS — Z8269 Family history of other diseases of the musculoskeletal system and connective tissue: Secondary | ICD-10-CM | POA: Insufficient documentation

## 2023-06-29 LAB — CBC WITH DIFFERENTIAL (CANCER CENTER ONLY)
Abs Immature Granulocytes: 0.01 10*3/uL (ref 0.00–0.07)
Basophils Absolute: 0 10*3/uL (ref 0.0–0.1)
Basophils Relative: 1 %
Eosinophils Absolute: 0 10*3/uL (ref 0.0–0.5)
Eosinophils Relative: 1 %
HCT: 41.4 % (ref 36.0–46.0)
Hemoglobin: 13.5 g/dL (ref 12.0–15.0)
Immature Granulocytes: 0 %
Lymphocytes Relative: 30 %
Lymphs Abs: 1.9 10*3/uL (ref 0.7–4.0)
MCH: 35 pg — ABNORMAL HIGH (ref 26.0–34.0)
MCHC: 32.6 g/dL (ref 30.0–36.0)
MCV: 107.3 fL — ABNORMAL HIGH (ref 80.0–100.0)
Monocytes Absolute: 0.7 10*3/uL (ref 0.1–1.0)
Monocytes Relative: 11 %
Neutro Abs: 3.8 10*3/uL (ref 1.7–7.7)
Neutrophils Relative %: 57 %
Platelet Count: 311 10*3/uL (ref 150–400)
RBC: 3.86 MIL/uL — ABNORMAL LOW (ref 3.87–5.11)
RDW: 13.5 % (ref 11.5–15.5)
WBC Count: 6.4 10*3/uL (ref 4.0–10.5)
nRBC: 0 % (ref 0.0–0.2)

## 2023-06-29 LAB — CMP (CANCER CENTER ONLY)
ALT: 15 U/L (ref 0–44)
AST: 16 U/L (ref 15–41)
Albumin: 3.5 g/dL (ref 3.5–5.0)
Alkaline Phosphatase: 41 U/L (ref 38–126)
Anion gap: 4 — ABNORMAL LOW (ref 5–15)
BUN: 11 mg/dL (ref 8–23)
CO2: 25 mmol/L (ref 22–32)
Calcium: 9 mg/dL (ref 8.9–10.3)
Chloride: 103 mmol/L (ref 98–111)
Creatinine: 0.91 mg/dL (ref 0.44–1.00)
GFR, Estimated: >60 mL/min (ref >60)
Glucose, Bld: 102 mg/dL — ABNORMAL HIGH (ref 70–99)
Potassium: 3.6 mmol/L (ref 3.5–5.1)
Sodium: 132 mmol/L — ABNORMAL LOW (ref 135–145)
Total Bilirubin: 0.3 mg/dL (ref 0.3–1.2)
Total Protein: 7.6 g/dL (ref 6.5–8.1)

## 2023-06-29 LAB — VITAMIN B12: Vitamin B-12: 873 pg/mL (ref 180–914)

## 2023-06-29 MED ORDER — HYDROXYUREA 500 MG PO CAPS
ORAL_CAPSULE | ORAL | 1 refills | Status: DC
Start: 2023-06-29 — End: 2023-12-18

## 2023-06-29 NOTE — Assessment & Plan Note (Addendum)
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  Labs are reviewed and discussed with patient. Stable platelets Continue hydroxyurea 1000 mg 4 days/week and 500 mg 3 days/week.  Refills of hydroxyurea was sent.

## 2023-06-29 NOTE — Assessment & Plan Note (Addendum)
Continue monitor  B12 level is normal

## 2023-06-29 NOTE — Progress Notes (Signed)
Hematology/Oncology Progress note [phone] [phone]   Chief Complaint:  Molly Perez is a 77 y.o. 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  Labs are reviewed and discussed with patient. Stable platelets Continue hydroxyurea 1000 mg 4 days/week and 500 mg 3 days/week.  Refills of hydroxyurea was sent.  History of anemia due to vitamin B12 deficiency Continue monitor  B12 level is normal   Encounter for antineoplastic chemotherapy Treatment plan as listed above.     Orders Placed This Encounter  Procedures   CBC with Differential (Cancer Center Only)    Standing Status:   Future    Standing Expiration Date:   06/28/2024   CMP (Cancer Center only)    Standing Status:   Future    Standing Expiration Date:   06/28/2024   Follow up  3 months lab cbc  6 months, lab MD cbc cmp B12  All questions were answered. The patient knows to call the clinic with any problems, questions or concerns.  Rickard Patience, MD, PhD [facility] Hematology Oncology 06/29/2023      PERTINENT ONCOLOGY HISTORY Molly Perez is a 77 y.o.afemale who has above oncology history reviewed by me today presented for follow up visit for management of ET Patient previously followed up by Dr.Corcoran, patient switched care to me on 06/20/21 Extensive medical record review was performed by me  11/05/2016. 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  Marrow was normocellular to mildy hypercellular for age (40-50%) with trilineage hematopoiesis including a proliferation of atypical megakaryocytes with focal clusters and no increase in blasts.  There was no  significant increase in marrow reticulin fibers.  Storage iron was present.  Cytogenetics were normal (46, XX).  Flow cytometry was negative.  FISH studies for MPN/CML were negative.  # 12/21/2016 Started on Hydroxyurea    Interval History:  Patient presents for follow-up of management of essential thrombocythemia. She takes hydroxyurea as instructed.   She reports feeling well.   No new complaints.   Past Medical History:  Diagnosis Date   Cataract cortical, senile    GERD (gastroesophageal reflux disease)    Hypercholesterolemia    Obesity    Pre-diabetes    Prediabetes    Thrombocytopenia (HCC)    Vitamin D deficiency     Past Surgical History:  Procedure Laterality Date   ABDOMINAL HYSTERECTOMY     COLONOSCOPY     COLONOSCOPY WITH PROPOFOL N/A 07/04/2016   Procedure: COLONOSCOPY WITH PROPOFOL;  Surgeon: Christena Deem, MD;  Location: [facility] ENDOSCOPY;  Service: Endoscopy;  Laterality: N/A;   ESOPHAGOGASTRODUODENOSCOPY (EGD) WITH PROPOFOL N/A 07/04/2016   Procedure: ESOPHAGOGASTRODUODENOSCOPY (EGD) WITH PROPOFOL;  Surgeon: Christena Deem, MD;  Location[facility] ENDOSCOPY;  Service: Endoscopy;  Laterality: N/A;    Family History  Problem Relation Age of Onset   Alzheimer's disease Father    Congestive Heart Failure Mother    Diabetes Mother    Multiple sclerosis Brother    Lupus Grandchild    Stroke Grandchild    Breast cancer Neg Hx     Social History:  reports that she has never smoked. Her smokeless tobacco use includes snuff. She reports that she does not drink alcohol and does not use  drugs. lives in [place].   She retired at age 84.  She worked for [employer].  She denies any exposure to radiation, toxins, and smoke.The patient is alone today.  Allergies:  Allergies  Allergen Reactions   Amoxicillin Other (See Comments)   Lipitor [Atorvastatin] Other (See Comments)    Weakness in legs Weakness in legs   Penicillins Other (See  Comments)    Pt states medication upsets her stomach    Current Medications: Current Outpatient Medications  Medication Sig Dispense Refill   acetaminophen (TYLENOL) 500 MG tablet Take 500 mg by mouth every 8 (eight) hours as needed for moderate pain.      amLODipine (NORVASC) 5 MG tablet Take 5 mg by mouth daily.     aspirin EC 81 MG tablet Take 81 mg by mouth daily.     baclofen (LIORESAL) 10 MG tablet Take 1 tablet (10 mg total) by mouth 3 (three) times daily. 30 each 0   benzonatate (TESSALON) 100 MG capsule Take 1 capsule (100 mg total) by mouth every 8 (eight) hours. 21 capsule 0   Calcium Carbonate-Vitamin D 600-400 MG-UNIT tablet Take 2 tablets by mouth daily.     Cholecalciferol (VITAMIN D3) 5000 UNITS CAPS Take 2,000 Units by mouth daily.      meloxicam (MOBIC) 15 MG tablet TAKE 1 TABLET (15 MG TOTAL) BY MOUTH DAILY AS NEEDED.     pantoprazole (PROTONIX) 40 MG tablet Take 40 mg by mouth daily.     pravastatin (PRAVACHOL) 80 MG tablet Take 80 mg by mouth daily.      vitamin B-12 (CYANOCOBALAMIN) 1000 MCG tablet Take 1 tablet (1,000 mcg total) by mouth daily. 30 tablet 0   doxycycline (VIBRAMYCIN) 100 MG capsule Take 1 capsule (100 mg total) by mouth 2 (two) times daily. (Patient not taking: Reported on 06/29/2023) 14 capsule 0   hydroxyurea (HYDREA) 500 MG capsule Take 2 capsules (1000mg ) on Monday, Wednesday, Friday, Saturday. Take 1 capsule (500 mg) on Tuesday, Thursday, and Sunday. 132 capsule 1   ondansetron (ZOFRAN) 8 MG tablet Take 8 mg by mouth every 8 (eight) hours as needed. (Patient not taking: Reported on 09/19/2021)     potassium chloride (KLOR-CON) 10 MEQ tablet Take 1 tablet (10 mEq total) by mouth daily for 4 days. (Patient not taking: Reported on 09/19/2021) 4 tablet 0   predniSONE (DELTASONE) 20 MG tablet Take 2 tablets (40 mg total) by mouth daily. (Patient not taking: Reported on 06/29/2023) 10 tablet 0   No current facility-administered medications for this visit.     Review of Systems  Constitutional:  Negative for chills, fever, malaise/fatigue and weight loss.  HENT:  Negative for hearing loss, nosebleeds, sore throat and tinnitus.   Eyes:  Negative for blurred vision and double vision.  Respiratory:  Negative for cough, hemoptysis, shortness of breath and wheezing.   Cardiovascular:  Negative for chest pain, palpitations and leg swelling.  Gastrointestinal:  Negative for abdominal pain, blood in stool, constipation, diarrhea, melena, nausea and vomiting.  Genitourinary:  Negative for dysuria and urgency.  Musculoskeletal:  Negative for back pain, falls, joint pain and myalgias.  Skin:  Negative for itching and rash.  Neurological:  Negative for dizziness, tingling, sensory change, loss of consciousness, weakness and headaches.  Endo/Heme/Allergies:  Negative for environmental allergies. Does not bruise/bleed easily.  Psychiatric/Behavioral:  Negative for depression. The patient is not nervous/anxious and does not have insomnia.    Performance status (ECOG): 1  Vitals  Blood pressure 130/86, pulse 69, temperature (!) 96.7 F (35.9 C), temperature source Tympanic, resp. rate 18, weight 221 lb (100.2 kg), SpO2 96%.   Physical Exam Constitutional:      General: She is not in acute distress.    Appearance: She is not diaphoretic.  HENT:     Head: Normocephalic and atraumatic.  Eyes:     General: No scleral icterus. Cardiovascular:     Rate and Rhythm: Normal rate and regular rhythm.     Heart sounds: No murmur heard. Pulmonary:     Effort: Pulmonary effort is normal. No respiratory distress.     Breath sounds: No wheezing.  Abdominal:     General: There is no distension.     Palpations: Abdomen is soft.     Tenderness: There is no abdominal tenderness.  Musculoskeletal:        General: Normal range of motion.     Cervical back: Normal range of motion and neck supple.  Skin:    General: Skin is warm and dry.     Findings: No erythema.   Neurological:     Mental Status: She is alert and oriented to person, place, and time. Mental status is at baseline.     Cranial Nerves: No cranial nerve deficit.     Motor: No abnormal muscle tone.  Psychiatric:        Mood and Affect: Mood and affect normal.      Labs.    Latest Ref Rng & Units 06/29/2023   10:08 AM 03/31/2023   10:56 AM 12/29/2022   10:16 AM  CBC  WBC 4.0 - 10.5 K/uL 6.4  7.0  9.0   Hemoglobin 12.0 - 15.0 g/dL 16.1  09.6  04.5   Hematocrit 36.0 - 46.0 % 41.4  38.7  41.3   Platelets 150 - 400 K/uL 311  325  384       Latest Ref Rng & Units 06/29/2023   10:08 AM 12/29/2022   10:16 AM 06/26/2022    9:59 AM  CMP  Glucose 70 - 99 mg/dL 409  93  92   BUN 8 - 23 mg/dL 11  9  12    Creatinine 0.44 - 1.00 mg/dL 8.11  9.14  7.82   Sodium 135 - 145 mmol/L 132  135  137   Potassium 3.5 - 5.1 mmol/L 3.6  3.8  3.7   Chloride 98 - 111 mmol/L 103  102  105   CO2 22 - 32 mmol/L 25  25  27    Calcium 8.9 - 10.3 mg/dL 9.0  9.1  8.8   Total Protein 6.5 - 8.1 g/dL 7.6  7.8  7.4   Total Bilirubin 0.3 - 1.2 mg/dL 0.3  0.7  0.7   Alkaline Phos 38 - 126 U/L 41  42  43   AST 15 - 41 U/L 16  17  16    ALT 0 - 44 U/L 15  14  12

## 2023-06-29 NOTE — Assessment & Plan Note (Signed)
Treatment plan as listed above. 

## 2023-07-21 ENCOUNTER — Other Ambulatory Visit: Payer: Self-pay | Admitting: Pediatrics

## 2023-07-21 DIAGNOSIS — Z1231 Encounter for screening mammogram for malignant neoplasm of breast: Secondary | ICD-10-CM

## 2023-08-27 ENCOUNTER — Ambulatory Visit
Admission: RE | Admit: 2023-08-27 | Discharge: 2023-08-27 | Disposition: A | Discharge status: Home / Self Care | Payer: Medicare HMO | Source: Ambulatory Visit | Attending: Pediatrics | Admitting: Pediatrics

## 2023-08-27 DIAGNOSIS — Z1231 Encounter for screening mammogram for malignant neoplasm of breast: Secondary | ICD-10-CM | POA: Insufficient documentation

## 2023-10-22 ENCOUNTER — Inpatient Hospital Stay: Payer: Medicare HMO | Attending: Oncology

## 2023-10-22 ENCOUNTER — Encounter: Payer: Self-pay | Admitting: Oncology

## 2023-10-22 ENCOUNTER — Inpatient Hospital Stay: Payer: Medicare HMO | Admitting: Oncology

## 2023-10-22 VITALS — BP 133/90 | HR 66 | Temp 96.7°F | Resp 18 | Wt 221.8 lb

## 2023-10-22 DIAGNOSIS — Z5111 Encounter for antineoplastic chemotherapy: Secondary | ICD-10-CM | POA: Diagnosis not present

## 2023-10-22 DIAGNOSIS — Z79899 Other long term (current) drug therapy: Secondary | ICD-10-CM | POA: Diagnosis not present

## 2023-10-22 DIAGNOSIS — Z8249 Family history of ischemic heart disease and other diseases of the circulatory system: Secondary | ICD-10-CM | POA: Diagnosis not present

## 2023-10-22 DIAGNOSIS — Z832 Family history of diseases of the blood and blood-forming organs and certain disorders involving the immune mechanism: Secondary | ICD-10-CM | POA: Insufficient documentation

## 2023-10-22 DIAGNOSIS — Z823 Family history of stroke: Secondary | ICD-10-CM | POA: Insufficient documentation

## 2023-10-22 DIAGNOSIS — Z862 Personal history of diseases of the blood and blood-forming organs and certain disorders involving the immune mechanism: Secondary | ICD-10-CM | POA: Diagnosis not present

## 2023-10-22 DIAGNOSIS — Z8269 Family history of other diseases of the musculoskeletal system and connective tissue: Secondary | ICD-10-CM | POA: Insufficient documentation

## 2023-10-22 DIAGNOSIS — D473 Essential (hemorrhagic) thrombocythemia: Secondary | ICD-10-CM | POA: Insufficient documentation

## 2023-10-22 DIAGNOSIS — Z833 Family history of diabetes mellitus: Secondary | ICD-10-CM | POA: Diagnosis not present

## 2023-10-22 DIAGNOSIS — Z90710 Acquired absence of both cervix and uterus: Secondary | ICD-10-CM | POA: Diagnosis not present

## 2023-10-22 DIAGNOSIS — Z880 Allergy status to penicillin: Secondary | ICD-10-CM | POA: Diagnosis not present

## 2023-10-22 DIAGNOSIS — Z7964 Long term (current) use of myelosuppressive agent: Secondary | ICD-10-CM | POA: Insufficient documentation

## 2023-10-22 LAB — CMP (CANCER CENTER ONLY)
ALT: 16 U/L (ref 0–44)
AST: 18 U/L (ref 15–41)
Albumin: 3.5 g/dL (ref 3.5–5.0)
Alkaline Phosphatase: 37 U/L — ABNORMAL LOW (ref 38–126)
Anion gap: 10 (ref 5–15)
BUN: 9 mg/dL (ref 8–23)
CO2: 25 mmol/L (ref 22–32)
Calcium: 9.3 mg/dL (ref 8.9–10.3)
Chloride: 100 mmol/L (ref 98–111)
Creatinine: 1.07 mg/dL — ABNORMAL HIGH (ref 0.44–1.00)
GFR, Estimated: 53 mL/min — ABNORMAL LOW (ref >60)
Glucose, Bld: 76 mg/dL (ref 70–99)
Potassium: 3.8 mmol/L (ref 3.5–5.1)
Sodium: 135 mmol/L (ref 135–145)
Total Bilirubin: 0.4 mg/dL (ref <1.2)
Total Protein: 7.6 g/dL (ref 6.5–8.1)

## 2023-10-22 LAB — CBC WITH DIFFERENTIAL (CANCER CENTER ONLY)
Abs Immature Granulocytes: 0.03 10*3/uL (ref 0.00–0.07)
Basophils Absolute: 0 10*3/uL (ref 0.0–0.1)
Basophils Relative: 0 %
Eosinophils Absolute: 0.1 10*3/uL (ref 0.0–0.5)
Eosinophils Relative: 1 %
HCT: 39.5 % (ref 36.0–46.0)
Hemoglobin: 12.9 g/dL (ref 12.0–15.0)
Immature Granulocytes: 0 %
Lymphocytes Relative: 31 %
Lymphs Abs: 2.3 10*3/uL (ref 0.7–4.0)
MCH: 35.2 pg — ABNORMAL HIGH (ref 26.0–34.0)
MCHC: 32.7 g/dL (ref 30.0–36.0)
MCV: 107.9 fL — ABNORMAL HIGH (ref 80.0–100.0)
Monocytes Absolute: 0.9 10*3/uL (ref 0.1–1.0)
Monocytes Relative: 11 %
Neutro Abs: 4.4 10*3/uL (ref 1.7–7.7)
Neutrophils Relative %: 57 %
Platelet Count: 350 10*3/uL (ref 150–400)
RBC: 3.66 MIL/uL — ABNORMAL LOW (ref 3.87–5.11)
RDW: 12.9 % (ref 11.5–15.5)
WBC Count: 7.7 10*3/uL (ref 4.0–10.5)
nRBC: 0 % (ref 0.0–0.2)

## 2023-10-22 NOTE — Assessment & Plan Note (Signed)
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  Labs are reviewed and discussed with patient. Stable platelets Continue hydroxyurea 1000 mg 4 days/week and 500 mg 3 days/week.  Refills of hydroxyurea was sent.

## 2023-10-22 NOTE — Progress Notes (Signed)
Hematology/Oncology Progress note [phone] [phone]   Chief Complaint:  Molly Perez is a 77 y.o. 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  Labs are reviewed and discussed with patient. Stable platelets Continue hydroxyurea 1000 mg 4 days/week and 500 mg 3 days/week.  Refills of hydroxyurea was sent.  Encounter for antineoplastic chemotherapy Treatment plan as listed above.   History of anemia due to vitamin B12 deficiency Continue monitor  B12 level is normal     Orders Placed This Encounter  Procedures   CMP (Cancer Center only)    Standing Status:   Future    Expected Date:   01/20/2024    Expiration Date:   10/21/2024   CBC with Differential (Cancer Center Only)    Standing Status:   Future    Expected Date:   01/20/2024    Expiration Date:   10/21/2024   Follow up  3 months, lab MD cbc cmp   All questions were answered. The patient knows to call the clinic with any problems, questions or concerns.  Molly Patience, MD, PhD [facility] Hematology Oncology 10/22/2023      PERTINENT ONCOLOGY HISTORY Molly Perez is a 77 y.o.afemale who has above oncology history reviewed by me today presented for follow up visit for management of ET Patient previously followed up by Dr.Corcoran, patient switched care to me on 06/20/21 Extensive medical record review was performed by me  11/05/2016. 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  Marrow was normocellular to mildy hypercellular for age (40-50%) with trilineage hematopoiesis including a proliferation of atypical megakaryocytes with focal clusters and no increase in  blasts.  There was no significant increase in marrow reticulin fibers.  Storage iron was present.  Cytogenetics were normal (46, XX).  Flow cytometry was negative.  FISH studies for MPN/CML were negative.  # 12/21/2016 Started on Hydroxyurea    Interval History:  Patient presents for follow-up of management of essential thrombocythemia. She takes hydroxyurea as instructed.   She reports feeling well.   No new complaints.   Past Medical History:  Diagnosis Date   Cataract cortical, senile    GERD (gastroesophageal reflux disease)    Hypercholesterolemia    Obesity    Pre-diabetes    Prediabetes    Thrombocytopenia (HCC)    Vitamin D deficiency     Past Surgical History:  Procedure Laterality Date   ABDOMINAL HYSTERECTOMY     COLONOSCOPY     COLONOSCOPY WITH PROPOFOL N/A 07/04/2016   Procedure: COLONOSCOPY WITH PROPOFOL;  Surgeon: Christena Deem, MD;  Location[facility] END[facility];  Service: Endoscopy;  Laterality: N/A;   ESOPHAGOGASTRODUODENOSCOPY (EGD) WITH PROPOFOL N/A 07/04/2016   Procedure: ESOPHAGOGASTRODUODENOSCOPY (EGD) WITH PROPOFOL;  Surgeon: Christena Deem, MD;  Location: [facility] ENDOSCOPY;  Service: Endoscopy;  Laterality: N/A;    Family History  Problem Relation Age of Onset   Alzheimer's disease Father    Congestive Heart Failure Mother    Diabetes Mother    Multiple sclerosis Brother    Lupus Grandchild    Stroke Grandchild    Breast cancer Neg Hx     Social History:  reports that she has never smoked. Her smokeless tobacco use includes snuff. She reports  that she does not drink alcohol and does not use drugs. lives in [place].   She retired at age 59.  She worked for [employer].  She denies any exposure to radiation, toxins, and smoke.The patient is alone today.  Allergies:  Allergies  Allergen Reactions   Amoxicillin Other (See Comments)   Lipitor [Atorvastatin] Other (See Comments)    Weakness in legs Weakness in legs    Penicillins Other (See Comments)    Pt states medication upsets her stomach    Current Medications: Current Outpatient Medications  Medication Sig Dispense Refill   acetaminophen (TYLENOL) 500 MG tablet Take 500 mg by mouth every 8 (eight) hours as needed for moderate pain.      amLODipine (NORVASC) 5 MG tablet Take 5 mg by mouth daily.     aspirin EC 81 MG tablet Take 81 mg by mouth daily.     baclofen (LIORESAL) 10 MG tablet Take 1 tablet (10 mg total) by mouth 3 (three) times daily. 30 each 0   benzonatate (TESSALON) 100 MG capsule Take 1 capsule (100 mg total) by mouth every 8 (eight) hours. 21 capsule 0   Calcium Carbonate-Vitamin D 600-400 MG-UNIT tablet Take 2 tablets by mouth daily.     Cholecalciferol (VITAMIN D3) 5000 UNITS CAPS Take 2,000 Units by mouth daily.      doxycycline (VIBRAMYCIN) 100 MG capsule Take 1 capsule (100 mg total) by mouth 2 (two) times daily. 14 capsule 0   hydroxyurea (HYDREA) 500 MG capsule Take 2 capsules (1000mg ) on Monday, Wednesday, Friday, Saturday. Take 1 capsule (500 mg) on Tuesday, Thursday, and Sunday. 132 capsule 1   pantoprazole (PROTONIX) 40 MG tablet Take 40 mg by mouth daily.     pravastatin (PRAVACHOL) 80 MG tablet Take 80 mg by mouth daily.      vitamin B-12 (CYANOCOBALAMIN) 1000 MCG tablet Take 1 tablet (1,000 mcg total) by mouth daily. 30 tablet 0   ondansetron (ZOFRAN) 8 MG tablet Take 8 mg by mouth every 8 (eight) hours as needed. (Patient not taking: Reported on 10/22/2023)     potassium chloride (KLOR-CON) 10 MEQ tablet Take 1 tablet (10 mEq total) by mouth daily for 4 days. (Patient not taking: Reported on 10/22/2023) 4 tablet 0   predniSONE (DELTASONE) 20 MG tablet Take 2 tablets (40 mg total) by mouth daily. (Patient not taking: Reported on 10/22/2023) 10 tablet 0   No current facility-administered medications for this visit.    Review of Systems  Constitutional:  Negative for chills, fever, malaise/fatigue and weight loss.  HENT:   Negative for hearing loss, nosebleeds, sore throat and tinnitus.   Eyes:  Negative for blurred vision and double vision.  Respiratory:  Negative for cough, hemoptysis, shortness of breath and wheezing.   Cardiovascular:  Negative for chest pain, palpitations and leg swelling.  Gastrointestinal:  Negative for abdominal pain, blood in stool, constipation, diarrhea, melena, nausea and vomiting.  Genitourinary:  Negative for dysuria and urgency.  Musculoskeletal:  Negative for back pain, falls, joint pain and myalgias.  Skin:  Negative for itching and rash.  Neurological:  Negative for dizziness, tingling, sensory change, loss of consciousness, weakness and headaches.  Endo/Heme/Allergies:  Negative for environmental allergies. Does not bruise/bleed easily.  Psychiatric/Behavioral:  Negative for depression. The patient is not nervous/anxious and does not have insomnia.    Performance status (ECOG): 1  Vitals Blood pressure (!) 133/90, pulse 66, temperature (!) 96.7 F (35.9 C), temperature source Tympanic, resp.  rate 18, weight 221 lb 12.8 oz (100.6 kg), SpO2 99%.   Physical Exam Constitutional:      General: She is not in acute distress.    Appearance: She is not diaphoretic.  HENT:     Head: Normocephalic and atraumatic.  Eyes:     General: No scleral icterus. Cardiovascular:     Rate and Rhythm: Normal rate and regular rhythm.     Heart sounds: No murmur heard. Pulmonary:     Effort: Pulmonary effort is normal. No respiratory distress.     Breath sounds: No wheezing.  Abdominal:     General: There is no distension.     Palpations: Abdomen is soft.     Tenderness: There is no abdominal tenderness.  Musculoskeletal:        General: Normal range of motion.     Cervical back: Normal range of motion and neck supple.  Skin:    General: Skin is warm and dry.     Findings: No erythema.  Neurological:     Mental Status: She is alert and oriented to person, place, and time. Mental  status is at baseline.     Cranial Nerves: No cranial nerve deficit.     Motor: No abnormal muscle tone.  Psychiatric:        Mood and Affect: Mood and affect normal.      Labs.    Latest Ref Rng & Units 10/22/2023   10:32 AM 06/29/2023   10:08 AM 03/31/2023   10:56 AM  CBC  WBC 4.0 - 10.5 K/uL 7.7  6.4  7.0   Hemoglobin 12.0 - 15.0 g/dL 16.1  09.6  04.5   Hematocrit 36.0 - 46.0 % 39.5  41.4  38.7   Platelets 150 - 400 K/uL 350  311  325       Latest Ref Rng & Units 10/22/2023   10:32 AM 06/29/2023   10:08 AM 12/29/2022   10:16 AM  CMP  Glucose 70 - 99 mg/dL 76  409  93   BUN 8 - 23 mg/dL 9  11  9    Creatinine 0.44 - 1.00 mg/dL 8.11  9.14  7.82   Sodium 135 - 145 mmol/L 135  132  135   Potassium 3.5 - 5.1 mmol/L 3.8  3.6  3.8   Chloride 98 - 111 mmol/L 100  103  102   CO2 22 - 32 mmol/L 25  25  25    Calcium 8.9 - 10.3 mg/dL 9.3  9.0  9.1   Total Protein 6.5 - 8.1 g/dL 7.6  7.6  7.8   Total Bilirubin <1.2 mg/dL 0.4  0.3  0.7   Alkaline Phos 38 - 126 U/L 37  41  42   AST 15 - 41 U/L 18  16  17    ALT 0 - 44 U/L 16  15  14

## 2023-10-22 NOTE — Assessment & Plan Note (Signed)
Treatment plan as listed above. 

## 2023-10-22 NOTE — Assessment & Plan Note (Signed)
Continue monitor  B12 level is normal

## 2023-12-11 ENCOUNTER — Other Ambulatory Visit: Payer: Self-pay | Admitting: Oncology

## 2023-12-11 DIAGNOSIS — D473 Essential (hemorrhagic) thrombocythemia: Secondary | ICD-10-CM

## 2024-01-20 ENCOUNTER — Inpatient Hospital Stay: Payer: Medicare HMO | Admitting: Oncology

## 2024-01-20 ENCOUNTER — Encounter: Payer: Self-pay | Admitting: Oncology

## 2024-01-20 ENCOUNTER — Inpatient Hospital Stay: Payer: Medicare HMO | Attending: Oncology

## 2024-01-20 VITALS — BP 144/92 | HR 71 | Temp 96.8°F | Resp 18 | Wt 222.6 lb

## 2024-01-20 DIAGNOSIS — Z8349 Family history of other endocrine, nutritional and metabolic diseases: Secondary | ICD-10-CM | POA: Diagnosis not present

## 2024-01-20 DIAGNOSIS — Z5111 Encounter for antineoplastic chemotherapy: Secondary | ICD-10-CM | POA: Diagnosis not present

## 2024-01-20 DIAGNOSIS — D473 Essential (hemorrhagic) thrombocythemia: Secondary | ICD-10-CM

## 2024-01-20 DIAGNOSIS — Z7964 Long term (current) use of myelosuppressive agent: Secondary | ICD-10-CM | POA: Insufficient documentation

## 2024-01-20 DIAGNOSIS — Z79899 Other long term (current) drug therapy: Secondary | ICD-10-CM | POA: Diagnosis not present

## 2024-01-20 DIAGNOSIS — Z8249 Family history of ischemic heart disease and other diseases of the circulatory system: Secondary | ICD-10-CM | POA: Insufficient documentation

## 2024-01-20 DIAGNOSIS — Z8269 Family history of other diseases of the musculoskeletal system and connective tissue: Secondary | ICD-10-CM | POA: Insufficient documentation

## 2024-01-20 DIAGNOSIS — Z862 Personal history of diseases of the blood and blood-forming organs and certain disorders involving the immune mechanism: Secondary | ICD-10-CM

## 2024-01-20 DIAGNOSIS — Z823 Family history of stroke: Secondary | ICD-10-CM | POA: Diagnosis not present

## 2024-01-20 DIAGNOSIS — Z90710 Acquired absence of both cervix and uterus: Secondary | ICD-10-CM | POA: Diagnosis not present

## 2024-01-20 DIAGNOSIS — Z833 Family history of diabetes mellitus: Secondary | ICD-10-CM | POA: Insufficient documentation

## 2024-01-20 DIAGNOSIS — Z880 Allergy status to penicillin: Secondary | ICD-10-CM | POA: Diagnosis not present

## 2024-01-20 DIAGNOSIS — Z7982 Long term (current) use of aspirin: Secondary | ICD-10-CM | POA: Diagnosis not present

## 2024-01-20 LAB — CBC WITH DIFFERENTIAL (CANCER CENTER ONLY)
Abs Immature Granulocytes: 0.03 10*3/uL (ref 0.00–0.07)
Basophils Absolute: 0.1 10*3/uL (ref 0.0–0.1)
Basophils Relative: 1 %
Eosinophils Absolute: 0.1 10*3/uL (ref 0.0–0.5)
Eosinophils Relative: 1 %
HCT: 39.8 % (ref 36.0–46.0)
Hemoglobin: 12.9 g/dL (ref 12.0–15.0)
Immature Granulocytes: 0 %
Lymphocytes Relative: 31 %
Lymphs Abs: 2.4 10*3/uL (ref 0.7–4.0)
MCH: 34.9 pg — ABNORMAL HIGH (ref 26.0–34.0)
MCHC: 32.4 g/dL (ref 30.0–36.0)
MCV: 107.6 fL — ABNORMAL HIGH (ref 80.0–100.0)
Monocytes Absolute: 0.7 10*3/uL (ref 0.1–1.0)
Monocytes Relative: 9 %
Neutro Abs: 4.5 10*3/uL (ref 1.7–7.7)
Neutrophils Relative %: 58 %
Platelet Count: 354 10*3/uL (ref 150–400)
RBC: 3.7 MIL/uL — ABNORMAL LOW (ref 3.87–5.11)
RDW: 14 % (ref 11.5–15.5)
WBC Count: 7.8 10*3/uL (ref 4.0–10.5)
nRBC: 0 % (ref 0.0–0.2)

## 2024-01-20 LAB — CMP (CANCER CENTER ONLY)
ALT: 14 U/L (ref 0–44)
AST: 17 U/L (ref 15–41)
Albumin: 3.5 g/dL (ref 3.5–5.0)
Alkaline Phosphatase: 37 U/L — ABNORMAL LOW (ref 38–126)
Anion gap: 5 (ref 5–15)
BUN: 9 mg/dL (ref 8–23)
CO2: 26 mmol/L (ref 22–32)
Calcium: 9.1 mg/dL (ref 8.9–10.3)
Chloride: 103 mmol/L (ref 98–111)
Creatinine: 0.92 mg/dL (ref 0.44–1.00)
GFR, Estimated: >60 mL/min (ref >60)
Glucose, Bld: 92 mg/dL (ref 70–99)
Potassium: 3.7 mmol/L (ref 3.5–5.1)
Sodium: 134 mmol/L — ABNORMAL LOW (ref 135–145)
Total Bilirubin: 0.4 mg/dL (ref 0.0–1.2)
Total Protein: 7.7 g/dL (ref 6.5–8.1)

## 2024-01-20 MED ORDER — HYDROXYUREA 500 MG PO CAPS: ORAL_CAPSULE | ORAL | 1 refills | Status: DC

## 2024-01-20 NOTE — Progress Notes (Signed)
 Hematology/Oncology Progress note [phone] [phone]) [phone]   Chief Complaint:  Molly Perez is a 78 y.o. 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  Labs are reviewed and discussed with patient. Stable platelets Continue hydroxyurea 1000 mg 4 days/week and 500 mg 3 days/week.  Refills of hydroxyurea was sent.  Encounter for antineoplastic chemotherapy Treatment plan as listed above.   History of anemia due to vitamin B12 deficiency Stable hemoglobin and B12 levles.     Orders Placed This Encounter  Procedures   CBC with Differential (Cancer Center Only)    Standing Status:   Future    Expected Date:   04/21/2024    Expiration Date:   01/19/2025   CMP (Cancer Center only)    Standing Status:   Future    Expected Date:   04/21/2024    Expiration Date:   01/19/2025   Follow up  3 months, lab MD cbc cmp   All questions were answered. The patient knows to call the clinic with any problems, questions or concerns.  Rickard Patience, MD, PhD [facility] Hematology Oncology 01/20/2024      PERTINENT ONCOLOGY HISTORY Molly Perez is a 78 y.o.afemale who has above oncology history reviewed by me today presented for follow up visit for management of ET Patient previously followed up by Dr.Corcoran, patient switched care to me on 06/20/21 Extensive medical record review was performed by me  11/05/2016. 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  Marrow was normocellular to mildy hypercellular for age (40-50%) with trilineage hematopoiesis including a proliferation of atypical megakaryocytes with focal clusters and no increase in blasts.   There was no significant increase in marrow reticulin fibers.  Storage iron was present.  Cytogenetics were normal (46, XX).  Flow cytometry was negative.  FISH studies for MPN/CML were negative.  # 12/21/2016 Started on Hydroxyurea    Interval History:  Patient presents for follow-up of management of essential thrombocythemia. She takes hydroxyurea as instructed.   She reports feeling well.   No new complaints.   Past Medical History:  Diagnosis Date   Cataract cortical, senile    GERD (gastroesophageal reflux disease)    Hypercholesterolemia    Obesity    Pre-diabetes    Prediabetes    Thrombocytopenia (HCC)    Vitamin D deficiency     Past Surgical History:  Procedure Laterality Date   ABDOMINAL HYSTERECTOMY     COLONOSCOPY     COLONOSCOPY WITH PROPOFOL N/A 07/04/2016   Procedure: COLONOSCOPY WITH PROPOFOL;  Surgeon: Christena Deem, MD;  Location: [facility] ENDOSCOPY;  Service: Endoscopy;  Laterality: N/A;   ESOPHAGOGASTRODUODENOSCOPY (EGD) WITH PROPOFOL N/A 07/04/2016   Procedure: ESOPHAGOGASTRODUODENOSCOPY (EGD) WITH PROPOFOL;  Surgeon: Christena Deem, MD;  Location: [facility] ENDOSCOPY;  Service: Endoscopy;  Laterality: N/A;    Family History  Problem Relation Age of Onset   Alzheimer's disease Father    Congestive Heart Failure Mother    Diabetes Mother    Multiple sclerosis Brother    Lupus Grandchild    Stroke Grandchild    Breast cancer Neg Hx     Social History:  reports that she has never smoked. Her smokeless tobacco use includes snuff. She reports that she  does not drink alcohol and does not use drugs. lives in [place].   She retired at age 89.  She worked for [employer].  She denies any exposure to radiation, toxins, and smoke.The patient is alone today.  Allergies:  Allergies  Allergen Reactions   Amoxicillin Other (See Comments)   Lipitor [Atorvastatin] Other (See Comments)    Weakness in legs Weakness in legs   Penicillins  Other (See Comments)    Pt states medication upsets her stomach    Current Medications: Current Outpatient Medications  Medication Sig Dispense Refill   acetaminophen (TYLENOL) 500 MG tablet Take 500 mg by mouth every 8 (eight) hours as needed for moderate pain.      amLODipine (NORVASC) 5 MG tablet Take 5 mg by mouth daily.     aspirin EC 81 MG tablet Take 81 mg by mouth daily.     baclofen (LIORESAL) 10 MG tablet Take 1 tablet (10 mg total) by mouth 3 (three) times daily. 30 each 0   benzonatate (TESSALON) 100 MG capsule Take 1 capsule (100 mg total) by mouth every 8 (eight) hours. 21 capsule 0   Calcium Carbonate-Vitamin D 600-400 MG-UNIT tablet Take 2 tablets by mouth daily.     Cholecalciferol (VITAMIN D3) 5000 UNITS CAPS Take 2,000 Units by mouth daily.      doxycycline (VIBRAMYCIN) 100 MG capsule Take 1 capsule (100 mg total) by mouth 2 (two) times daily. 14 capsule 0   pantoprazole (PROTONIX) 40 MG tablet Take 40 mg by mouth daily.     pravastatin (PRAVACHOL) 80 MG tablet Take 80 mg by mouth daily.      vitamin B-12 (CYANOCOBALAMIN) 1000 MCG tablet Take 1 tablet (1,000 mcg total) by mouth daily. 30 tablet 0   hydroxyurea (HYDREA) 500 MG capsule TAKE 2 CAP ON MON, WED, FRI, SAT. TAKE 1 CAPSULE (500 MG) ON TUESDAY, THURSDAY, AND SUNDAY. 132 capsule 1   ondansetron (ZOFRAN) 8 MG tablet Take 8 mg by mouth every 8 (eight) hours as needed. (Patient not taking: Reported on 09/19/2021)     potassium chloride (KLOR-CON) 10 MEQ tablet Take 1 tablet (10 mEq total) by mouth daily for 4 days. (Patient not taking: Reported on 01/20/2024) 4 tablet 0   predniSONE (DELTASONE) 20 MG tablet Take 2 tablets (40 mg total) by mouth daily. (Patient not taking: Reported on 06/29/2023) 10 tablet 0   No current facility-administered medications for this visit.    Review of Systems  Constitutional:  Negative for chills, fever, malaise/fatigue and weight loss.  HENT:  Negative for hearing loss, nosebleeds, sore  throat and tinnitus.   Eyes:  Negative for blurred vision and double vision.  Respiratory:  Negative for cough, hemoptysis, shortness of breath and wheezing.   Cardiovascular:  Negative for chest pain, palpitations and leg swelling.  Gastrointestinal:  Negative for abdominal pain, blood in stool, constipation, diarrhea, melena, nausea and vomiting.  Genitourinary:  Negative for dysuria and urgency.  Musculoskeletal:  Negative for back pain, falls, joint pain and myalgias.  Skin:  Negative for itching and rash.  Neurological:  Negative for dizziness, tingling, sensory change, loss of consciousness, weakness and headaches.  Endo/Heme/Allergies:  Negative for environmental allergies. Does not bruise/bleed easily.  Psychiatric/Behavioral:  Negative for depression. The patient is not nervous/anxious and does not have insomnia.    Performance status (ECOG): 1  Vitals Blood pressure (!) 144/92, pulse 71, temperature (!) 96.8 F (36 C), temperature source Tympanic, resp. rate 18, weight  222 lb 9.6 oz (101 kg), SpO2 100%.   Physical Exam Constitutional:      General: She is not in acute distress.    Appearance: She is not diaphoretic.  HENT:     Head: Normocephalic and atraumatic.  Eyes:     General: No scleral icterus. Cardiovascular:     Rate and Rhythm: Normal rate and regular rhythm.     Heart sounds: No murmur heard. Pulmonary:     Effort: Pulmonary effort is normal. No respiratory distress.     Breath sounds: No wheezing.  Abdominal:     General: There is no distension.     Palpations: Abdomen is soft.     Tenderness: There is no abdominal tenderness.  Musculoskeletal:        General: Normal range of motion.     Cervical back: Normal range of motion and neck supple.  Skin:    General: Skin is warm and dry.     Findings: No erythema.  Neurological:     Mental Status: She is alert and oriented to person, place, and time. Mental status is at baseline.     Cranial Nerves: No  cranial nerve deficit.     Motor: No abnormal muscle tone.  Psychiatric:        Mood and Affect: Mood and affect normal.      Labs.    Latest Ref Rng & Units 01/20/2024   10:26 AM 10/22/2023   10:32 AM 06/29/2023   10:08 AM  CBC  WBC 4.0 - 10.5 K/uL 7.8  7.7  6.4   Hemoglobin 12.0 - 15.0 g/dL 16.1  09.6  04.5   Hematocrit 36.0 - 46.0 % 39.8  39.5  41.4   Platelets 150 - 400 K/uL 354  350  311       Latest Ref Rng & Units 01/20/2024   10:26 AM 10/22/2023   10:32 AM 06/29/2023   10:08 AM  CMP  Glucose 70 - 99 mg/dL 92  76  409   BUN 8 - 23 mg/dL 9  9  11    Creatinine 0.44 - 1.00 mg/dL 8.11  9.14  7.82   Sodium 135 - 145 mmol/L 134  135  132   Potassium 3.5 - 5.1 mmol/L 3.7  3.8  3.6   Chloride 98 - 111 mmol/L 103  100  103   CO2 22 - 32 mmol/L 26  25  25    Calcium 8.9 - 10.3 mg/dL 9.1  9.3  9.0   Total Protein 6.5 - 8.1 g/dL 7.7  7.6  7.6   Total Bilirubin 0.0 - 1.2 mg/dL 0.4  0.4  0.3   Alkaline Phos 38 - 126 U/L 37  37  41   AST 15 - 41 U/L 17  18  16    ALT 0 - 44 U/L 14  16  15

## 2024-01-20 NOTE — Assessment & Plan Note (Signed)
 Treatment plan as listed above.

## 2024-01-20 NOTE — Assessment & Plan Note (Addendum)
 Stable hemoglobin and B12 levles.

## 2024-01-20 NOTE — Assessment & Plan Note (Signed)
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  Labs are reviewed and discussed with patient. Stable platelets Continue hydroxyurea 1000 mg 4 days/week and 500 mg 3 days/week.  Refills of hydroxyurea was sent.

## 2024-02-10 ENCOUNTER — Other Ambulatory Visit: Payer: Self-pay | Admitting: Pediatrics

## 2024-02-10 DIAGNOSIS — I10 Essential (primary) hypertension: Secondary | ICD-10-CM

## 2024-02-10 DIAGNOSIS — M810 Age-related osteoporosis without current pathological fracture: Secondary | ICD-10-CM

## 2024-02-22 ENCOUNTER — Ambulatory Visit
Admission: RE | Admit: 2024-02-22 | Discharge: 2024-02-22 | Disposition: A | Discharge status: Home / Self Care | Source: Ambulatory Visit | Attending: Pediatrics | Admitting: Pediatrics

## 2024-02-22 DIAGNOSIS — M810 Age-related osteoporosis without current pathological fracture: Secondary | ICD-10-CM | POA: Diagnosis present

## 2024-02-22 DIAGNOSIS — I10 Essential (primary) hypertension: Secondary | ICD-10-CM | POA: Diagnosis present

## 2024-04-26 ENCOUNTER — Inpatient Hospital Stay: Admitting: Oncology

## 2024-04-26 ENCOUNTER — Encounter: Payer: Self-pay | Admitting: Oncology

## 2024-04-26 ENCOUNTER — Inpatient Hospital Stay: Attending: Oncology

## 2024-04-26 VITALS — BP 133/80 | HR 70 | Temp 97.1°F | Resp 18 | Wt 224.5 lb

## 2024-04-26 DIAGNOSIS — Z862 Personal history of diseases of the blood and blood-forming organs and certain disorders involving the immune mechanism: Secondary | ICD-10-CM

## 2024-04-26 DIAGNOSIS — D519 Vitamin B12 deficiency anemia, unspecified: Secondary | ICD-10-CM | POA: Insufficient documentation

## 2024-04-26 DIAGNOSIS — Z8269 Family history of other diseases of the musculoskeletal system and connective tissue: Secondary | ICD-10-CM | POA: Insufficient documentation

## 2024-04-26 DIAGNOSIS — Z79899 Other long term (current) drug therapy: Secondary | ICD-10-CM | POA: Diagnosis not present

## 2024-04-26 DIAGNOSIS — Z880 Allergy status to penicillin: Secondary | ICD-10-CM | POA: Diagnosis not present

## 2024-04-26 DIAGNOSIS — Z833 Family history of diabetes mellitus: Secondary | ICD-10-CM | POA: Diagnosis not present

## 2024-04-26 DIAGNOSIS — Z8249 Family history of ischemic heart disease and other diseases of the circulatory system: Secondary | ICD-10-CM | POA: Insufficient documentation

## 2024-04-26 DIAGNOSIS — Z7964 Long term (current) use of myelosuppressive agent: Secondary | ICD-10-CM | POA: Diagnosis not present

## 2024-04-26 DIAGNOSIS — Z90710 Acquired absence of both cervix and uterus: Secondary | ICD-10-CM | POA: Insufficient documentation

## 2024-04-26 DIAGNOSIS — D473 Essential (hemorrhagic) thrombocythemia: Secondary | ICD-10-CM | POA: Diagnosis not present

## 2024-04-26 DIAGNOSIS — E7800 Pure hypercholesterolemia, unspecified: Secondary | ICD-10-CM | POA: Diagnosis not present

## 2024-04-26 DIAGNOSIS — Z818 Family history of other mental and behavioral disorders: Secondary | ICD-10-CM | POA: Diagnosis not present

## 2024-04-26 DIAGNOSIS — Z823 Family history of stroke: Secondary | ICD-10-CM | POA: Insufficient documentation

## 2024-04-26 DIAGNOSIS — Z7982 Long term (current) use of aspirin: Secondary | ICD-10-CM | POA: Diagnosis not present

## 2024-04-26 LAB — CBC WITH DIFFERENTIAL (CANCER CENTER ONLY)
Abs Immature Granulocytes: 0.02 10*3/uL (ref 0.00–0.07)
Basophils Absolute: 0.1 10*3/uL (ref 0.0–0.1)
Basophils Relative: 1 %
Eosinophils Absolute: 0 10*3/uL (ref 0.0–0.5)
Eosinophils Relative: 0 %
HCT: 37.9 % (ref 36.0–46.0)
Hemoglobin: 12.7 g/dL (ref 12.0–15.0)
Immature Granulocytes: 0 %
Lymphocytes Relative: 29 %
Lymphs Abs: 2 10*3/uL (ref 0.7–4.0)
MCH: 34.6 pg — ABNORMAL HIGH (ref 26.0–34.0)
MCHC: 33.5 g/dL (ref 30.0–36.0)
MCV: 103.3 fL — ABNORMAL HIGH (ref 80.0–100.0)
Monocytes Absolute: 0.6 10*3/uL (ref 0.1–1.0)
Monocytes Relative: 9 %
Neutro Abs: 4.2 10*3/uL (ref 1.7–7.7)
Neutrophils Relative %: 61 %
Platelet Count: 307 10*3/uL (ref 150–400)
RBC: 3.67 MIL/uL — ABNORMAL LOW (ref 3.87–5.11)
RDW: 12.9 % (ref 11.5–15.5)
WBC Count: 6.9 10*3/uL (ref 4.0–10.5)
nRBC: 0 % (ref 0.0–0.2)

## 2024-04-26 LAB — CMP (CANCER CENTER ONLY)
ALT: 15 U/L (ref 0–44)
AST: 16 U/L (ref 15–41)
Albumin: 3.3 g/dL — ABNORMAL LOW (ref 3.5–5.0)
Alkaline Phosphatase: 39 U/L (ref 38–126)
Anion gap: 6 (ref 5–15)
BUN: 10 mg/dL (ref 8–23)
CO2: 24 mmol/L (ref 22–32)
Calcium: 8.9 mg/dL (ref 8.9–10.3)
Chloride: 105 mmol/L (ref 98–111)
Creatinine: 0.83 mg/dL (ref 0.44–1.00)
GFR, Estimated: >60 mL/min (ref >60)
Glucose, Bld: 108 mg/dL — ABNORMAL HIGH (ref 70–99)
Potassium: 3.7 mmol/L (ref 3.5–5.1)
Sodium: 135 mmol/L (ref 135–145)
Total Bilirubin: 0.7 mg/dL (ref 0.0–1.2)
Total Protein: 7.1 g/dL (ref 6.5–8.1)

## 2024-04-26 MED ORDER — HYDROXYUREA 500 MG PO CAPS: ORAL_CAPSULE | ORAL | 1 refills | Status: DC

## 2024-04-26 NOTE — Assessment & Plan Note (Signed)
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  Labs are reviewed and discussed with patient. Stable platelets Continue hydroxyurea 1000 mg 4 days/week and 500 mg 3 days/week.  Refills of hydroxyurea was sent.

## 2024-04-26 NOTE — Progress Notes (Signed)
 Hematology/Oncology Progress note [phone] [phone]   Chief Complaint:  Molly Perez is a 78 y.o. 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  Labs are reviewed and discussed with patient. Stable platelets Continue hydroxyurea  1000 mg 4 days/week and 500 mg 3 days/week.  Refills of hydroxyurea  was sent.  History of anemia due to vitamin B12 deficiency Stable  B12 levles.     Orders Placed This Encounter  Procedures   CBC with Differential (Cancer Center Only)    Standing Status:   Future    Expected Date:   08/26/2024    Expiration Date:   11/24/2024   CMP (Cancer Center only)    Standing Status:   Future    Expected Date:   08/26/2024    Expiration Date:   11/24/2024   Follow up  3 months, lab MD cbc cmp   All questions were answered. The patient knows to call the clinic with any problems, questions or concerns.  Molly Cap, MD, PhD [facility] Hematology Oncology 04/26/2024      PERTINENT ONCOLOGY HISTORY Molly Perez is a 78 y.o.afemale who has above oncology history reviewed by me today presented for follow up visit for management of ET Patient previously followed up by Dr.Corcoran, patient switched care to me on 06/20/21 Extensive medical record review was performed by me  11/05/2016. 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  Marrow was normocellular to mildy hypercellular for age (40-50%) with trilineage hematopoiesis including a proliferation of atypical megakaryocytes with focal clusters and no increase in blasts.  There was no significant increase in marrow reticulin fibers.  Storage iron was present.   Cytogenetics were normal (46, XX).  Flow cytometry was negative.  FISH studies for MPN/CML were negative.  # 12/21/2016 Started on Hydroxyurea     Interval History:  Patient presents for follow-up of management of essential thrombocythemia. She takes hydroxyurea  as instructed.   She reports feeling well.   No new complaints.   Past Medical History:  Diagnosis Date   Cataract cortical, senile    GERD (gastroesophageal reflux disease)    Hypercholesterolemia    Obesity    Pre-diabetes    Prediabetes    Thrombocytopenia (HCC)    Vitamin D  deficiency     Past Surgical History:  Procedure Laterality Date   ABDOMINAL HYSTERECTOMY     COLONOSCOPY     COLONOSCOPY WITH PROPOFOL  N/A 07/04/2016   Procedure: COLONOSCOPY WITH PROPOFOL ;  Surgeon: Gladis RAYMOND Mariner, MD;  Location: [facility] ENDOSCOPY;  Service: Endoscopy;  Laterality: N/A;   ESOPHAGOGASTRODUODENOSCOPY (EGD) WITH PROPOFOL  N/A 07/04/2016   Procedure: ESOPHAGOGASTRODUODENOSCOPY (EGD) WITH PROPOFOL ;  Surgeon: Gladis RAYMOND Mariner, MD;  Location[facility] ENDOSCOPY;  Service: Endoscopy;  Laterality: N/A;    Family History  Problem Relation Age of Onset   Alzheimer's disease Father    Congestive Heart Failure Mother    Diabetes Mother    Multiple sclerosis Brother    Lupus Grandchild    Stroke Grandchild    Breast cancer Neg Hx     Social History:  reports that she has never smoked. Her smokeless tobacco use includes snuff. She reports that she does not drink alcohol and does not use drugs. lives in [place].  She retired at age 21.  She worked for [employer].  She denies any exposure to radiation, toxins, and smoke.The patient is alone today.  Allergies:  Allergies  Allergen Reactions   Amoxicillin Other (See Comments)   Lipitor [Atorvastatin] Other (See Comments)    Weakness in legs Weakness in legs   Penicillins Other (See Comments)    Pt states medication upsets her stomach    Current  Medications: Current Outpatient Medications  Medication Sig Dispense Refill   acetaminophen  (TYLENOL ) 500 MG tablet Take 500 mg by mouth every 8 (eight) hours as needed for moderate pain.      amLODipine (NORVASC) 5 MG tablet Take 5 mg by mouth daily.     aspirin EC 81 MG tablet Take 81 mg by mouth daily.     baclofen  (LIORESAL ) 10 MG tablet Take 1 tablet (10 mg total) by mouth 3 (three) times daily. 30 each 0   benzonatate  (TESSALON ) 100 MG capsule Take 1 capsule (100 mg total) by mouth every 8 (eight) hours. 21 capsule 0   Calcium Carbonate-Vitamin D  600-400 MG-UNIT tablet Take 2 tablets by mouth daily.     Cholecalciferol (VITAMIN D3) 5000 UNITS CAPS Take 2,000 Units by mouth daily.      doxycycline  (VIBRAMYCIN ) 100 MG capsule Take 1 capsule (100 mg total) by mouth 2 (two) times daily. 14 capsule 0   pantoprazole  (PROTONIX ) 40 MG tablet Take 40 mg by mouth daily.     pravastatin  (PRAVACHOL ) 80 MG tablet Take 80 mg by mouth daily.      vitamin B-12 (CYANOCOBALAMIN ) 1000 MCG tablet Take 1 tablet (1,000 mcg total) by mouth daily. 30 tablet 0   hydroxyurea  (HYDREA ) 500 MG capsule TAKE 2 Perez ON MON, WED, FRI, SAT. TAKE 1 CAPSULE (500 MG) ON TUESDAY, THURSDAY, AND SUNDAY. 132 capsule 1   ondansetron  (ZOFRAN ) 8 MG tablet Take 8 mg by mouth every 8 (eight) hours as needed. (Patient not taking: Reported on 04/26/2024)     potassium chloride  (KLOR-CON ) 10 MEQ tablet Take 1 tablet (10 mEq total) by mouth daily for 4 days. (Patient not taking: Reported on 04/26/2024) 4 tablet 0   predniSONE  (DELTASONE ) 20 MG tablet Take 2 tablets (40 mg total) by mouth daily. (Patient not taking: Reported on 04/26/2024) 10 tablet 0   No current facility-administered medications for this visit.    Review of Systems  Constitutional:  Negative for chills, fever, malaise/fatigue and weight loss.  HENT:  Negative for hearing loss, nosebleeds, sore throat and tinnitus.   Eyes:  Negative for blurred vision and double vision.   Respiratory:  Negative for cough, hemoptysis, shortness of breath and wheezing.   Cardiovascular:  Negative for chest pain, palpitations and leg swelling.  Gastrointestinal:  Negative for abdominal pain, blood in stool, constipation, diarrhea, melena, nausea and vomiting.  Genitourinary:  Negative for dysuria and urgency.  Musculoskeletal:  Negative for back pain, falls, joint pain and myalgias.  Skin:  Negative for itching and rash.  Neurological:  Negative for dizziness, tingling, sensory change, loss of consciousness, weakness and headaches.  Endo/Heme/Allergies:  Negative for environmental allergies. Does not bruise/bleed easily.  Psychiatric/Behavioral:  Negative for depression. The patient is not nervous/anxious and does not have insomnia.    Performance status (ECOG): 1  Vitals Blood pressure 133/80, pulse 70, temperature (!) 97.1 F (36.2 C), temperature source Tympanic, resp. rate 18, weight 224 lb 8 oz (101.8 kg), SpO2 96%.   Physical Exam Constitutional:  General: She is not in acute distress.    Appearance: She is not diaphoretic.  HENT:     Head: Normocephalic and atraumatic.   Eyes:     General: No scleral icterus.   Cardiovascular:     Rate and Rhythm: Normal rate and regular rhythm.     Heart sounds: No murmur heard. Pulmonary:     Effort: Pulmonary effort is normal. No respiratory distress.     Breath sounds: No wheezing.  Abdominal:     General: There is no distension.     Palpations: Abdomen is soft.     Tenderness: There is no abdominal tenderness.   Musculoskeletal:        General: Normal range of motion.     Cervical back: Normal range of motion and neck supple.   Skin:    General: Skin is warm and dry.     Findings: No erythema.   Neurological:     Mental Status: She is alert and oriented to person, place, and time. Mental status is at baseline.     Cranial Nerves: No cranial nerve deficit.     Motor: No abnormal muscle tone.    Psychiatric:        Mood and Affect: Mood and affect normal.      Labs.    Latest Ref Rng & Units 04/26/2024   10:26 AM 01/20/2024   10:26 AM 10/22/2023   10:32 AM  CBC  WBC 4.0 - 10.5 K/uL 6.9  7.8  7.7   Hemoglobin 12.0 - 15.0 g/dL 87.2  87.0  87.0   Hematocrit 36.0 - 46.0 % 37.9  39.8  39.5   Platelets 150 - 400 K/uL 307  354  350       Latest Ref Rng & Units 04/26/2024   10:26 AM 01/20/2024   10:26 AM 10/22/2023   10:32 AM  CMP  Glucose 70 - 99 mg/dL 891  92  76   BUN 8 - 23 mg/dL 10  9  9    Creatinine 0.44 - 1.00 mg/dL 9.16  9.07  8.92   Sodium 135 - 145 mmol/L 135  134  135   Potassium 3.5 - 5.1 mmol/L 3.7  3.7  3.8   Chloride 98 - 111 mmol/L 105  103  100   CO2 22 - 32 mmol/L 24  26  25    Calcium 8.9 - 10.3 mg/dL 8.9  9.1  9.3   Total Protein 6.5 - 8.1 g/dL 7.1  7.7  7.6   Total Bilirubin 0.0 - 1.2 mg/dL 0.7  0.4  0.4   Alkaline Phos 38 - 126 U/L 39  37  37   AST 15 - 41 U/L 16  17  18    ALT 0 - 44 U/L 15  14  16

## 2024-04-26 NOTE — Assessment & Plan Note (Addendum)
 Stable  B12 levles.

## 2024-07-15 ENCOUNTER — Encounter: Payer: Self-pay | Admitting: Emergency Medicine

## 2024-07-15 ENCOUNTER — Ambulatory Visit
Admission: EM | Admit: 2024-07-15 | Discharge: 2024-07-15 | Disposition: A | Discharge status: Home / Self Care | Attending: Physician Assistant | Admitting: Physician Assistant

## 2024-07-15 DIAGNOSIS — R21 Rash and other nonspecific skin eruption: Secondary | ICD-10-CM | POA: Diagnosis not present

## 2024-07-15 MED ORDER — TRIAMCINOLONE ACETONIDE 0.1 % EX CREA: 1.0000 | TOPICAL_CREAM | Freq: Two times a day (BID) | CUTANEOUS | 1 refills | Status: AC

## 2024-07-15 NOTE — ED Triage Notes (Signed)
 Patient reports red itchy bumps on her legs and arms for the past 2 days.  Patient denies any fevers.

## 2024-07-15 NOTE — ED Provider Notes (Signed)
 [facility]    CSN: [id number] Arrival date & time: 07/15/24  1942      History   Chief Complaint No chief complaint on file.   HPI Hazyl Marseille is a 78 y.o. female with history of chronic back pain. GERD, pre-diabetes, obesity, hyperlipidemia, thrombocytopenia, vitamin D  deficiency and vitamin B12 deficiency presents for sores/wounds on leg x 2 days.   HPI  Past Medical History:  Diagnosis Date   Cataract cortical, senile    GERD (gastroesophageal reflux disease)    Hypercholesterolemia    Obesity    Pre-diabetes    Prediabetes    Thrombocytopenia (HCC)    Vitamin D  deficiency     Patient Active Problem List   Diagnosis Date Noted   Encounter for antineoplastic chemotherapy 06/27/2022   History of anemia due to vitamin B12 deficiency 06/27/2022   Osteopenia of spine 04/26/2019   Spondylosis of lumbar region without myelopathy or radiculopathy 12/06/2018   Breast calcification, left 03/23/2017   Essential thrombocythemia (HCC) 11/26/2016   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   Bursitis of left shoulder 07/30/2016   Elevated total protein 07/09/2016   MCL deficiency, 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   Internal derangement of right 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   Back pain, chronic 05/09/2015   Hypercholesteremia 05/09/2015   Obesity, Class II, BMI 35-39.9 05/09/2015   Prediabetes 05/09/2015   Avitaminosis D 05/09/2015    Past Surgical History:  Procedure Laterality Date   ABDOMINAL HYSTERECTOMY     COLONOSCOPY     COLONOSCOPY WITH PROPOFOL  N/A 07/04/2016   Procedure: COLONOSCOPY WITH PROPOFOL ;  Surgeon: Gladis RAYMOND Mariner, MD;  Location: [facility] ENDOSCOPY;  Service: Endoscopy;  Laterality: N/A;   ESOPHAGOGASTRODUODENOSCOPY (EGD) WITH PROPOFOL  N/A 07/04/2016   Procedure: ESOPHAGOGASTRODUODENOSCOPY (EGD) WITH PROPOFOL ;  Surgeon: Gladis RAYMOND Mariner, MD;  Location: [facility] ENDOSCOPY;  Service: Endoscopy;  Laterality: N/A;    OB History   No obstetric history on file.      Home  Medications    Prior to Admission medications   Medication Sig Start Date End Date Taking? Authorizing Provider  acetaminophen  (TYLENOL ) 500 MG tablet Take 500 mg by mouth every 8 (eight) hours as needed for moderate pain.     [provider]  amLODipine (NORVASC) 5 MG tablet Take 5 mg by mouth daily. 08/13/21   [provider]  aspirin EC 81 MG tablet Take 81 mg by mouth daily.    [provider]  Calcium Carbonate-Vitamin D  600-400 MG-UNIT tablet Take 2 tablets by mouth daily.    [provider]  Cholecalciferol (VITAMIN D3) 5000 UNITS CAPS Take 2,000 Units by mouth daily.  03/29/15   [provider]  hydroxyurea  (HYDREA ) 500 MG capsule TAKE 2 CAP ON MON, WED, FRI, SAT. TAKE 1 CAPSULE (500 MG) ON TUESDAY, THURSDAY, AND SUNDAY. 04/26/24   Babara Call, MD  pantoprazole  (PROTONIX ) 40 MG tablet Take 40 mg by mouth daily.    [provider]  potassium chloride  (KLOR-CON ) 10 MEQ tablet Take 1 tablet (10 mEq total) by mouth daily for 4 days. Patient not taking: Reported on 04/26/2024 12/20/20 09/19/21  Corcoran, Melissa C, MD  pravastatin  (PRAVACHOL ) 80 MG tablet Take 80 mg by mouth daily.  07/08/16 04/14/38  [provider]  vitamin B-12 (CYANOCOBALAMIN ) 1000 MCG tablet Take 1 tablet (1,000 mcg total) by mouth daily. 08/01/15   Loria Fallow, MD    Family History Family History  Problem Relation Age of Onset   Alzheimer's disease Father    Congestive Heart  Failure Mother    Diabetes Mother    Multiple sclerosis Brother    Lupus Grandchild    Stroke Grandchild    Breast cancer Neg Hx     Social History Social History   Tobacco Use   Smoking status: Never   Smokeless tobacco: Current    Types: Snuff  Vaping Use   Vaping status: Never Used  Substance Use Topics   Alcohol use: No    Alcohol/week: 0.0 standard drinks of alcohol   Drug use: No     Allergies   Amoxicillin, Lipitor [atorvastatin], and Penicillins   Review of  Systems Review of Systems  Constitutional:  Negative for fatigue and fever.  Musculoskeletal:  Negative for arthralgias and joint swelling.  Skin:  Positive for color change and wound.  Neurological:  Negative for weakness.     Physical Exam Triage Vital Signs ED Triage Vitals  Encounter Vitals Group     BP      Girls Systolic BP Percentile      Girls Diastolic BP Percentile      Boys Systolic BP Percentile      Boys Diastolic BP Percentile      Pulse      Resp      Temp      Temp src      SpO2      Weight      Height      Head Circumference      Peak Flow      Pain Score      Pain Loc      Pain Education      Exclude from Growth Chart    No data found.  Updated Vital Signs There were no vitals taken for this visit.     Physical Exam Vitals and nursing note reviewed.  Constitutional:      General: She is not in acute distress.    Appearance: Normal appearance. She is not ill-appearing or toxic-appearing.  HENT:     Head: Normocephalic and atraumatic.  Eyes:     General: No scleral icterus.       Right eye: No discharge.        Left eye: No discharge.     Conjunctiva/sclera: Conjunctivae normal.  Cardiovascular:     Rate and Rhythm: Normal rate and regular rhythm.     Pulses: Normal pulses.     Heart sounds: Normal heart sounds.  Pulmonary:     Effort: Pulmonary effort is normal. No respiratory distress.     Breath sounds: Normal breath sounds.  Musculoskeletal:     Cervical back: Neck supple.  Skin:    General: Skin is dry.  Neurological:     General: No focal deficit present.     Mental Status: She is alert. Mental status is at baseline.     Motor: No weakness.     Gait: Gait abnormal.  Psychiatric:        Mood and Affect: Mood normal.        Behavior: Behavior normal.         UC Treatments / Results  Labs (all labs ordered are listed, but only abnormal results are displayed) Labs Reviewed - No data to display  EKG   Radiology No  results found.  Procedures Procedures (including critical care time)  Medications Ordered in UC Medications - No data to display  Initial Impression / Assessment and Plan / UC Course  I have reviewed the triage vital  signs and the nursing notes.  Pertinent labs & imaging results that were available during my care of the patient were reviewed by me and considered in my medical decision making (see chart for details).   79 y/o female with history of chronic back pain, obesity, pre-diabetes presents for wounds of legs x 2 days.   Patient is overall well appearing and in no acute distress.   Reviewed practicing good hygiene and discussed wound care guidelines.   *** Final Clinical Impressions(s) / UC Diagnoses   Final diagnoses:  None   Discharge Instructions   None    ED Prescriptions   None    PDMP not reviewed this encounter.

## 2024-07-15 NOTE — Discharge Instructions (Addendum)
-   Rash is suspicious for bedbug bites. - You need to clean all of your bedding on very high heat or throw it out and get new bedding.  You should also contact an exterminator. - I sent a topical corticosteroid cream to the pharmacy to help with the bites.  You can also take antihistamines like Benadryl for the itching. - Please make a follow-up appointment with your primary care provider sometime in the next week or so.

## 2024-07-19 NOTE — Progress Notes (Signed)
 Chief Complaint  Patient presents with  . Rash    On legs   . Chills    Since sunday  . Fatigue    Subjective  Molly Perez is a 78 y.o. female who presents for Rash (On legs ), Chills (Since sunday), and Fatigue HPI History of Present Illness Molly Perez is a 78 year old female who presents with a rash on her legs.  She noticed the sudden appearance of 'purple dots' on both legs, resembling a Dalmatian pattern, last Tuesday or Wednesday morning. Initially, they appeared as small bumps and then developed into larger purple spots. There is no associated itching with the lesions.  She visited the emergency room at [facility], [place], where she was informed that the rash was not shingles and her platelet count was normal. She has been applying 2.5% cortisone cream to the affected areas but has not taken Benadryl.  The rash has not changed.  She denies abdominal pain but reports having experienced some belly pain recently, which she thought might be related to something she ate. She also reports joint aches and pains, particularly in her hips, which she is unsure if they are related to the rash. She has a history of knee problems and is in need of a knee replacement.  She is currently taking hydroxyurea  because of myeloproliferative neoplasm most consistent with essential thrombocytosis.  She is followed by hematology oncology and is not on any anticoagulants or blood thinners. She reports experiencing chills. She also reports having diarrhea frequently but denies any recent loose stools.  She has an upcoming appointment on October 27th but is unsure if she can wait until then.  Review of Systems  Patient Active Problem List  Diagnosis  . Vitamin D  deficiency  . Dorsalgia  . Pure hypercholesterolemia  . Internal derangement of right knee  . Class 3 severe obesity due to excess calories with serious comorbidity and body mass index (BMI) of 40.0 to 44.9 in adult  .  Prediabetes  . Elevated total protein  . Chronic left shoulder pain  . Bursitis of left shoulder  . Breast calcification, left  . Spondylosis of lumbar region without myelopathy or radiculopathy  . Essential thrombocythemia (CMS/HHS-HCC)  . Vitamin B12 deficiency  . Stage 3a chronic kidney disease (CMS/HHS-HCC)  . Essential hypertension  . Osteoporosis of lumbar spine  . Constipation  . Chronic pain of right knee  . Dental caries  . Gastroesophageal reflux disease without esophagitis  . Arthritis of left knee  . Encounter for antineoplastic chemotherapy    Outpatient Medications Prior to Visit  Medication Sig Dispense Refill  . acetaminophen  (TYLENOL ) 500 MG tablet Take 500 mg by mouth continuously as needed for Pain.    SABRA amLODIPine (NORVASC) 5 MG tablet TAKE 1 TABLET BY MOUTH EVERY DAY 90 tablet 1  . aspirin 81 MG EC tablet Take by mouth.    . baclofen  (LIORESAL ) 10 MG tablet Take 10 mg by mouth 3 (three) times daily    . calcium carbonate-vitamin D3 (CALTRATE 600+D) 600 mg(1,500mg ) -200 unit tablet Take 1 tablet by mouth 2 (two) times daily with meals    . cholecalciferol (VITAMIN D3) 2,000 unit capsule Take 2,000 Units by mouth once daily    . cyanocobalamin  (VITAMIN B12) 1000 MCG tablet Take 1,000 mcg by mouth once daily.      SABRA docusate (COLACE) 100 MG capsule Take 1 capsule (100 mg total) by mouth 2 (two) times daily    .  hydrocortisone 2.5 % lotion Apply topically    . hydroxyurea  (HYDREA ) 500 mg capsule Take 1 cap (500 mg)Sun, Mon, Tues, Thurs, and Friday and take 2 caps (1000 mg)on Wed and Sat.    . ondansetron  (ZOFRAN ) 8 MG tablet Take by mouth    . pantoprazole  (PROTONIX ) 20 MG DR tablet TAKE 1 TABLET BY MOUTH EVERY DAY 90 tablet 3  . pravastatin  (PRAVACHOL ) 80 MG tablet Take 1 tablet (80 mg total) by mouth at bedtime 90 tablet 3  . triamcinolone  0.1 % cream Apply 1 Application topically 2 (two) times daily     No facility-administered medications prior to visit.       Objective  Vitals:   07/19/24 1434  BP: 125/80  Pulse: 107  Temp: 36.8 C (98.2 F)  TempSrc: Oral  SpO2: 98%  Weight: 99.3 kg (219 lb)  PainSc: 0-No pain   Body mass index is 42.77 kg/m.  Home Vitals:     Physical Exam Physical Exam SKIN: Purpura on legs, purple spots resembling Dalmatian pattern, tender to palpation.  Constitutional: alert, obese, in NAD, and communicates well Eye exam: sclera anicteric. Neck: supple Respiratory: clear to auscultation, without rales or wheezes  Cardiovascular: regular rate and rhythm and without murmurs, rubs or gallops Lower extremities: lower extremity edema marked Skin: Both lower extremities with multiple different sized purple palpable lesions, nontender, nonblanching. 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 Platelet count normal. Differential includes medication reaction or other causes. Concern for renal involvement. Symptoms include tenderness and swelling, no itching. ER visit ruled out shingles and platelet issues. Cortisone cream 2.5% applied, no Benadryl taken as recommended by ER. - Contact hematologist for further evaluation and management - Document possible Henoch-Schnlein purpura in medical record - Advise to seek immediate care if symptoms worsen  Joint pain Joint pain in hips, possibly related to underlying conditions. No new specific joint issues beyond existing knee problems.  Chills Chills present. No recent COVID-19 diagnosis. No specific cause identified. Diagnoses and all orders for this visit:  Purpura ()  Arthralgia, unspecified joint    This visit was coded based on medical decision making (MDM).           Future Appointments     Date/Time  Provider Department Center Visit Type   02/02/2025 9:00 AM (Arrive by 8:45 AM) POPULATION HEALTH NURSE - [facility] WELLNESS   02/02/2025 10:00 AM (Arrive by 9:45 AM) Delfina Darice Nice, MD [facility] OFFICE VISIT       There are no Patient Instructions on file for this visit.  An after visit summary was provided for the patient either in written format (printed) or through [facility].  This note has been created using automated tools and reviewed for accuracy by MARIO E OLMEDO.

## 2024-07-20 ENCOUNTER — Telehealth: Payer: Self-pay | Admitting: *Deleted

## 2024-07-20 NOTE — Telephone Encounter (Addendum)
  Pt calling stating that she has a rash on bilat legs. She saw PCP yesterday and he advised to contact heme-onc for assessment of rash, due to her being on Hydrea . (see PCP note in care everywhere). Pt has also been seen at [facility] ER and urgent care. Pt scheduled to see you on 10/27 and pt does not think she can wait until then. Please advise.

## 2024-07-20 NOTE — Telephone Encounter (Addendum)
 Dr. Babara has reviewed chart and thinks it is less likely due to Hydrea  since she has been on medication for a while. She recommends dermatology referral and is ok with appt to be moved up. Spoke to pt and informed her of plan.   Referral faxed to [facility] dermatology.   Please move up appts to early Oct . Please contact pt to r/s as she has other appts.

## 2024-07-20 NOTE — Telephone Encounter (Signed)
 Went to [facility] 9/12-notes from that day: History              Chief Complaint    Chief Complaint  Patient presents with   Rash      HPI Molly Perez is a 78 y.o. female with history of chronic back pain. GERD, pre-diabetes, obesity, hyperlipidemia, thrombocytopenia, vitamin D  deficiency and vitamin B12 deficiency presents for large pruritic swollen erythematous papules x 2 days. Patient denies any contacts with similar rashes. Reports sleeping alone in her own bed and says she is not very hygenic and her room is dirty. She is concerned about possible bed bugs but says she has a special bed bug cover on her mattress so it should not be that. She has not seen any bed bugs or recalled any other insect bites. Denies any treatment so far.  Discharge notes:Suspect possible bedbugs or other insect bite.  Sent triamcinolone  topical cream and advised use of Benadryl.  Reviewed cleaning all bedding and high heat or throwing it out and replacing. Reviewed practicing good hygiene and discussed wound care guidelines.  Discussed calling an exterminator.  Reviewed following up with PCP if symptoms not improving or if they worsen.     Final Clinical Impressions(s) / UC Diagnoses    Final diagnoses:  Rash and nonspecific skin eruption     UNK favor ED  The results  Discharge Instructions Molly Perez, Molly Perez - 07/17/2024 1:33 PM EDT  Formatting of this note might be different from the original. You were seen in the Emergency Department for a rash. The cause of your symptoms is unclear, but it is likely a reaction to something your skin has contacted, a viral infection, or a medication reaction.  I have provided you with topical hydrocortisone for your rash, which you should apply as directed on the packaging.  Please follow up with your primary care provider in 5-7 days to ensure you are improving.  Please return to the Emergency Department immediately if you  experience any new and concerning symptoms including fevers, shaking chills, spreading to your ear, spreading to your nose, spreading to your mouth or tongue, difficulty breathing, chest pain, tearing of your skin, severely painful rash, or any other signs that concern you.  The patient wants to know what it is and what to stop the the bumps.

## 2024-08-16 ENCOUNTER — Encounter: Payer: Self-pay | Admitting: Oncology

## 2024-08-16 ENCOUNTER — Inpatient Hospital Stay: Admitting: Oncology

## 2024-08-16 ENCOUNTER — Inpatient Hospital Stay: Attending: Oncology

## 2024-08-16 VITALS — BP 133/81 | HR 75 | Temp 97.3°F | Resp 18 | Wt 213.8 lb

## 2024-08-16 DIAGNOSIS — Z8249 Family history of ischemic heart disease and other diseases of the circulatory system: Secondary | ICD-10-CM | POA: Diagnosis not present

## 2024-08-16 DIAGNOSIS — Z7964 Long term (current) use of myelosuppressive agent: Secondary | ICD-10-CM | POA: Diagnosis not present

## 2024-08-16 DIAGNOSIS — Z823 Family history of stroke: Secondary | ICD-10-CM | POA: Diagnosis not present

## 2024-08-16 DIAGNOSIS — Z820 Family history of epilepsy and other diseases of the nervous system: Secondary | ICD-10-CM | POA: Diagnosis not present

## 2024-08-16 DIAGNOSIS — Z7982 Long term (current) use of aspirin: Secondary | ICD-10-CM | POA: Diagnosis not present

## 2024-08-16 DIAGNOSIS — R21 Rash and other nonspecific skin eruption: Secondary | ICD-10-CM | POA: Insufficient documentation

## 2024-08-16 DIAGNOSIS — Z880 Allergy status to penicillin: Secondary | ICD-10-CM | POA: Insufficient documentation

## 2024-08-16 DIAGNOSIS — Z833 Family history of diabetes mellitus: Secondary | ICD-10-CM | POA: Insufficient documentation

## 2024-08-16 DIAGNOSIS — Z5111 Encounter for antineoplastic chemotherapy: Secondary | ICD-10-CM

## 2024-08-16 DIAGNOSIS — Z90710 Acquired absence of both cervix and uterus: Secondary | ICD-10-CM | POA: Insufficient documentation

## 2024-08-16 DIAGNOSIS — D473 Essential (hemorrhagic) thrombocythemia: Secondary | ICD-10-CM | POA: Diagnosis present

## 2024-08-16 DIAGNOSIS — Z832 Family history of diseases of the blood and blood-forming organs and certain disorders involving the immune mechanism: Secondary | ICD-10-CM | POA: Diagnosis not present

## 2024-08-16 DIAGNOSIS — Z79899 Other long term (current) drug therapy: Secondary | ICD-10-CM | POA: Insufficient documentation

## 2024-08-16 LAB — CBC WITH DIFFERENTIAL (CANCER CENTER ONLY)
Abs Immature Granulocytes: 0.03 K/uL (ref 0.00–0.07)
Basophils Absolute: 0 K/uL (ref 0.0–0.1)
Basophils Relative: 0 %
Eosinophils Absolute: 0.1 K/uL (ref 0.0–0.5)
Eosinophils Relative: 1 %
HCT: 36.8 % (ref 36.0–46.0)
Hemoglobin: 12.2 g/dL (ref 12.0–15.0)
Immature Granulocytes: 0 %
Lymphocytes Relative: 32 %
Lymphs Abs: 2.6 K/uL (ref 0.7–4.0)
MCH: 35.3 pg — ABNORMAL HIGH (ref 26.0–34.0)
MCHC: 33.2 g/dL (ref 30.0–36.0)
MCV: 106.4 fL — ABNORMAL HIGH (ref 80.0–100.0)
Monocytes Absolute: 0.7 K/uL (ref 0.1–1.0)
Monocytes Relative: 8 %
Neutro Abs: 4.8 K/uL (ref 1.7–7.7)
Neutrophils Relative %: 59 %
Platelet Count: 305 K/uL (ref 150–400)
RBC: 3.46 MIL/uL — ABNORMAL LOW (ref 3.87–5.11)
RDW: 14.7 % (ref 11.5–15.5)
WBC Count: 8.1 K/uL (ref 4.0–10.5)
nRBC: 0 % (ref 0.0–0.2)

## 2024-08-16 LAB — CMP (CANCER CENTER ONLY)
ALT: 14 U/L (ref 0–44)
AST: 16 U/L (ref 15–41)
Albumin: 3.2 g/dL — ABNORMAL LOW (ref 3.5–5.0)
Alkaline Phosphatase: 43 U/L (ref 38–126)
Anion gap: 9 (ref 5–15)
BUN: 10 mg/dL (ref 8–23)
CO2: 23 mmol/L (ref 22–32)
Calcium: 8.9 mg/dL (ref 8.9–10.3)
Chloride: 102 mmol/L (ref 98–111)
Creatinine: 0.93 mg/dL (ref 0.44–1.00)
GFR, Estimated: >60 mL/min (ref >60)
Glucose, Bld: 101 mg/dL — ABNORMAL HIGH (ref 70–99)
Potassium: 3.7 mmol/L (ref 3.5–5.1)
Sodium: 134 mmol/L — ABNORMAL LOW (ref 135–145)
Total Bilirubin: 0.6 mg/dL (ref 0.0–1.2)
Total Protein: 7.2 g/dL (ref 6.5–8.1)

## 2024-08-16 NOTE — Assessment & Plan Note (Signed)
 Treatment plan as listed above.

## 2024-08-16 NOTE — Assessment & Plan Note (Signed)
 Lower extremity rash, possible erythema nodosum or cutaneous vasculitis Rash improved with prednisone , suggesting autoimmune etiology. Differential includes erythema nodosum and cutaneous vasculitis. Unlikely related to hydroxyurea . Dermatologist noted spots may take a year to resolve.  Continue follow-up with primary care physician and dermatologist if symptoms worsen.

## 2024-08-16 NOTE — Assessment & Plan Note (Signed)
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  Labs are reviewed and discussed with patient. Stable platelets Continue hydroxyurea 1000 mg 4 days/week and 500 mg 3 days/week.  Refills of hydroxyurea was sent.

## 2024-08-16 NOTE — Progress Notes (Signed)
 Hematology/Oncology Progress note [phone] [phone]   Chief Complaint:  Molly Perez is a 78 y.o. 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  Labs are reviewed and discussed with patient. Stable platelets Continue hydroxyurea  1000 mg 4 days/week and 500 mg 3 days/week.  Refills of hydroxyurea  was sent.  Encounter for antineoplastic chemotherapy Treatment plan as listed above.   Skin rash Lower extremity rash, possible erythema nodosum or cutaneous vasculitis Rash improved with prednisone , suggesting autoimmune etiology. Differential includes erythema nodosum and cutaneous vasculitis. Unlikely related to hydroxyurea . Dermatologist noted spots may take a year to resolve.  Continue follow-up with primary care physician and dermatologist if symptoms worsen.     Orders Placed This Encounter  Procedures   CBC with Differential (Cancer Center Only)    Standing Status:   Future    Expected Date:   11/21/2024    Expiration Date:   02/14/2025   CMP (Cancer Center only)    Standing Status:   Future    Expected Date:   11/21/2024    Expiration Date:   02/14/2025   Follow up  3 months, lab MD cbc cmp   All questions were answered. The patient knows to call the clinic with any problems, questions or concerns.  [name], MD, PhD [facility] Hematology Oncology 08/16/2024      PERTINENT ONCOLOGY HISTORY Molly Perez is a 78 y.o.afemale who has above oncology history reviewed by me today presented for follow up visit for management of ET Patient previously followed up by Dr.Corcoran, patient switched care to me on 06/20/21 Extensive medical record review was performed by me  11/05/2016. JAK2 V617F + 12/09/2016  Bone marrow  aspirate and biopsy revealed a myeloproliferative neoplasm best classified as essential thrombocythemia (ET).  Marrow was normocellular to mildy hypercellular for age (40-50%) with trilineage hematopoiesis including a proliferation of atypical megakaryocytes with focal clusters and no increase in blasts.  There was no significant increase in marrow reticulin fibers.  Storage iron was present.  Cytogenetics were normal (46, XX).  Flow cytometry was negative.  FISH studies for MPN/CML were negative.  # 12/21/2016 Started on Hydroxyurea     Interval History:  Patient presents for follow-up of management of essential thrombocythemia.  Discussed the use of AI scribe software for clinical note transcription with the patient, who gave verbal consent to proceed.   She has a rash that was evaluated by a dermatologist, who suspects it to be vasculitis. A biopsy was performed, but she does not recall the results. The rash was treated with prednisone , which improved the condition. She describes the rash as causing her legs to swell and feel as if they had fluid in them, although it was not itchy. The rash has left her legs 'spotty,' and she was informed it might take a year for the spots to resolve. She takes hydroxyurea  as instructed.   She reports feeling well.   No new complaints.   Past Medical History:  Diagnosis Date   Cataract cortical, senile    GERD (gastroesophageal reflux disease)    Hypercholesterolemia    Obesity    Pre-diabetes    Prediabetes    Thrombocytopenia    Vitamin D  deficiency     Past Surgical History:  Procedure Laterality Date   ABDOMINAL HYSTERECTOMY  COLONOSCOPY     COLONOSCOPY WITH PROPOFOL  N/A 07/04/2016   Procedure: COLONOSCOPY WITH PROPOFOL ;  Surgeon: Gladis RAYMOND Mariner, MD;  Location: [facility] ENDOSCOPY;  Service: Endoscopy;  Laterality: N/A;   ESOPHAGOGASTRODUODENOSCOPY (EGD) WITH PROPOFOL  N/A 07/04/2016   Procedure: ESOPHAGOGASTRODUODENOSCOPY (EGD) WITH PROPOFOL ;   Surgeon: Gladis RAYMOND Mariner, MD;  Location: [facility] ENDOSCOPY;  Service: Endoscopy;  Laterality: N/A;    Family History  Problem Relation Age of Onset   Alzheimer's disease Father    Congestive Heart Failure Mother    Diabetes Mother    Multiple sclerosis Brother    Lupus Grandchild    Stroke Grandchild    Breast cancer Neg Hx     Social History:  reports that she has never smoked. Her smokeless tobacco use includes snuff. She reports that she does not drink alcohol and does not use drugs. lives in [place].   She retired at age 78.  She worked for [employer].  She denies any exposure to radiation, toxins, and smoke.The patient is alone today.  Allergies:  Allergies  Allergen Reactions   Amoxicillin Other (See Comments)   Lipitor [Atorvastatin] Other (See Comments)    Weakness in legs Weakness in legs   Penicillins Other (See Comments)    Pt states medication upsets her stomach    Current Medications: Current Outpatient Medications  Medication Sig Dispense Refill   acetaminophen  (TYLENOL ) 500 MG tablet Take 500 mg by mouth every 8 (eight) hours as needed for moderate pain.      amLODipine (NORVASC) 5 MG tablet Take 5 mg by mouth daily.     aspirin EC 81 MG tablet Take 81 mg by mouth daily.     Calcium Carbonate-Vitamin D  600-400 MG-UNIT tablet Take 2 tablets by mouth daily.     Cholecalciferol (VITAMIN D3) 5000 UNITS CAPS Take 2,000 Units by mouth daily.      hydroxyurea  (HYDREA ) 500 MG capsule TAKE 2 CAP ON MON, WED, FRI, SAT. TAKE 1 CAPSULE (500 MG) ON TUESDAY, THURSDAY, AND SUNDAY. 132 capsule 1   pantoprazole  (PROTONIX ) 40 MG tablet Take 40 mg by mouth daily.     pravastatin  (PRAVACHOL ) 80 MG tablet Take 80 mg by mouth daily.      triamcinolone  cream (KENALOG ) 0.1 % Apply 1 Application topically 2 (two) times daily. 45 g 1   vitamin B-12 (CYANOCOBALAMIN ) 1000 MCG tablet Take 1 tablet (1,000 mcg total) by mouth daily. 30 tablet 0   potassium chloride   (KLOR-CON ) 10 MEQ tablet Take 1 tablet (10 mEq total) by mouth daily for 4 days. (Patient not taking: Reported on 08/16/2024) 4 tablet 0   No current facility-administered medications for this visit.    Review of Systems  Constitutional:  Negative for chills, fever, malaise/fatigue and weight loss.  HENT:  Negative for hearing loss, nosebleeds, sore throat and tinnitus.   Eyes:  Negative for blurred vision and double vision.  Respiratory:  Negative for cough, hemoptysis, shortness of breath and wheezing.   Cardiovascular:  Negative for chest pain, palpitations and leg swelling.  Gastrointestinal:  Negative for abdominal pain, blood in stool, constipation, diarrhea, melena, nausea and vomiting.  Genitourinary:  Negative for dysuria and urgency.  Musculoskeletal:  Negative for back pain, falls, joint pain and myalgias.  Skin:  Positive for rash. Negative for itching.  Neurological:  Negative for dizziness, tingling, sensory change, loss of consciousness, weakness and headaches.  Endo/Heme/Allergies:  Negative for environmental allergies. Does not bruise/bleed easily.  Psychiatric/Behavioral:  Negative for  depression. The patient is not nervous/anxious and does not have insomnia.    Performance status (ECOG): 1  Vitals Blood pressure 133/81, pulse 75, temperature (!) 97.3 F (36.3 C), resp. rate 18, weight 213 lb 12.8 oz (97 kg).   Physical Exam Constitutional:      General: She is not in acute distress.    Appearance: She is not diaphoretic.  HENT:     Head: Normocephalic and atraumatic.  Eyes:     General: No scleral icterus. Cardiovascular:     Rate and Rhythm: Normal rate and regular rhythm.     Heart sounds: No murmur heard. Pulmonary:     Effort: Pulmonary effort is normal. No respiratory distress.     Breath sounds: No wheezing.  Abdominal:     General: There is no distension.     Palpations: Abdomen is soft.     Tenderness: There is no abdominal tenderness.   Musculoskeletal:        General: Normal range of motion.     Cervical back: Normal range of motion and neck supple.  Skin:    General: Skin is warm and dry.     Findings: No erythema.     Comments: Skin discoloration on lower extremities  Neurological:     Mental Status: She is alert and oriented to person, place, and time. Mental status is at baseline.     Cranial Nerves: No cranial nerve deficit.     Motor: No abnormal muscle tone.  Psychiatric:        Mood and Affect: Mood and affect normal.      Labs.    Latest Ref Rng & Units 08/16/2024   12:42 PM 04/26/2024   10:26 AM 01/20/2024   10:26 AM  CBC  WBC 4.0 - 10.5 K/uL 8.1  6.9  7.8   Hemoglobin 12.0 - 15.0 g/dL 87.7  87.2  87.0   Hematocrit 36.0 - 46.0 % 36.8  37.9  39.8   Platelets 150 - 400 K/uL 305  307  354       Latest Ref Rng & Units 08/16/2024   12:42 PM 04/26/2024   10:26 AM 01/20/2024   10:26 AM  CMP  Glucose 70 - 99 mg/dL 898  891  92   BUN 8 - 23 mg/dL 10  10  9    Creatinine 0.44 - 1.00 mg/dL 9.06  9.16  9.07   Sodium 135 - 145 mmol/L 134  135  134   Potassium 3.5 - 5.1 mmol/L 3.7  3.7  3.7   Chloride 98 - 111 mmol/L 102  105  103   CO2 22 - 32 mmol/L 23  24  26    Calcium 8.9 - 10.3 mg/dL 8.9  8.9  9.1   Total Protein 6.5 - 8.1 g/dL 7.2  7.1  7.7   Total Bilirubin 0.0 - 1.2 mg/dL 0.6  0.7  0.4   Alkaline Phos 38 - 126 U/L 43  39  37   AST 15 - 41 U/L 16  16  17    ALT 0 - 44 U/L 14  15  14

## 2024-08-16 NOTE — Progress Notes (Signed)
 Patient here for follow up. Pt reports that she has seen dermatology for the rash that she was having and is currently applying triamcinolone  cream to legs.

## 2024-08-19 ENCOUNTER — Other Ambulatory Visit: Payer: Self-pay | Admitting: Pediatrics

## 2024-08-19 DIAGNOSIS — Z1231 Encounter for screening mammogram for malignant neoplasm of breast: Secondary | ICD-10-CM

## 2024-08-29 ENCOUNTER — Other Ambulatory Visit

## 2024-08-29 ENCOUNTER — Ambulatory Visit: Admitting: Oncology

## 2024-09-22 ENCOUNTER — Ambulatory Visit
Admission: RE | Admit: 2024-09-22 | Discharge: 2024-09-22 | Disposition: A | Discharge status: Home / Self Care | Source: Ambulatory Visit | Attending: Pediatrics | Admitting: Pediatrics

## 2024-09-22 DIAGNOSIS — Z1231 Encounter for screening mammogram for malignant neoplasm of breast: Secondary | ICD-10-CM | POA: Diagnosis present

## 2024-11-13 ENCOUNTER — Other Ambulatory Visit: Payer: Self-pay | Admitting: Oncology

## 2024-11-13 DIAGNOSIS — D473 Essential (hemorrhagic) thrombocythemia: Secondary | ICD-10-CM

## 2024-11-21 ENCOUNTER — Inpatient Hospital Stay: Admitting: Oncology

## 2024-11-21 ENCOUNTER — Encounter: Payer: Self-pay | Admitting: Oncology

## 2024-11-21 ENCOUNTER — Inpatient Hospital Stay: Attending: Oncology

## 2024-11-21 VITALS — BP 133/77 | HR 71 | Temp 98.0°F | Resp 18 | Wt 214.0 lb

## 2024-11-21 DIAGNOSIS — D473 Essential (hemorrhagic) thrombocythemia: Secondary | ICD-10-CM

## 2024-11-21 DIAGNOSIS — R21 Rash and other nonspecific skin eruption: Secondary | ICD-10-CM | POA: Diagnosis not present

## 2024-11-21 DIAGNOSIS — Z5111 Encounter for antineoplastic chemotherapy: Secondary | ICD-10-CM | POA: Diagnosis not present

## 2024-11-21 DIAGNOSIS — Z862 Personal history of diseases of the blood and blood-forming organs and certain disorders involving the immune mechanism: Secondary | ICD-10-CM | POA: Diagnosis not present

## 2024-11-21 LAB — CMP (CANCER CENTER ONLY)
ALT: 9 U/L (ref 0–44)
AST: 18 U/L (ref 15–41)
Albumin: 3.6 g/dL (ref 3.5–5.0)
Alkaline Phosphatase: 54 U/L (ref 38–126)
Anion gap: 8 (ref 5–15)
BUN: 7 mg/dL — ABNORMAL LOW (ref 8–23)
CO2: 27 mmol/L (ref 22–32)
Calcium: 9.3 mg/dL (ref 8.9–10.3)
Chloride: 102 mmol/L (ref 98–111)
Creatinine: 0.95 mg/dL (ref 0.44–1.00)
GFR, Estimated: >60 mL/min
Glucose, Bld: 86 mg/dL (ref 70–99)
Potassium: 3.9 mmol/L (ref 3.5–5.1)
Sodium: 137 mmol/L (ref 135–145)
Total Bilirubin: 0.3 mg/dL (ref 0.0–1.2)
Total Protein: 7.5 g/dL (ref 6.5–8.1)

## 2024-11-21 LAB — CBC WITH DIFFERENTIAL (CANCER CENTER ONLY)
Abs Immature Granulocytes: 0.02 K/uL (ref 0.00–0.07)
Basophils Absolute: 0 K/uL (ref 0.0–0.1)
Basophils Relative: 0 %
Eosinophils Absolute: 0.1 K/uL (ref 0.0–0.5)
Eosinophils Relative: 1 %
HCT: 41.5 % (ref 36.0–46.0)
Hemoglobin: 13.4 g/dL (ref 12.0–15.0)
Immature Granulocytes: 0 %
Lymphocytes Relative: 35 %
Lymphs Abs: 2.4 K/uL (ref 0.7–4.0)
MCH: 34.2 pg — ABNORMAL HIGH (ref 26.0–34.0)
MCHC: 32.3 g/dL (ref 30.0–36.0)
MCV: 105.9 fL — ABNORMAL HIGH (ref 80.0–100.0)
Monocytes Absolute: 0.8 K/uL (ref 0.1–1.0)
Monocytes Relative: 12 %
Neutro Abs: 3.6 K/uL (ref 1.7–7.7)
Neutrophils Relative %: 52 %
Platelet Count: 328 K/uL (ref 150–400)
RBC: 3.92 MIL/uL (ref 3.87–5.11)
RDW: 13.4 % (ref 11.5–15.5)
WBC Count: 6.9 K/uL (ref 4.0–10.5)
nRBC: 0 % (ref 0.0–0.2)

## 2024-11-21 NOTE — Assessment & Plan Note (Signed)
 Treatment plan as listed above.

## 2024-11-21 NOTE — Assessment & Plan Note (Addendum)
 Will check B12 the next visit

## 2024-11-21 NOTE — Assessment & Plan Note (Signed)
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  Labs are reviewed and discussed with patient. Stable platelets Continue hydroxyurea 1000 mg 4 days/week and 500 mg 3 days/week.  Refills of hydroxyurea was sent.

## 2024-11-21 NOTE — Progress Notes (Signed)
 " Hematology/Oncology Progress note [phone] [phone]   Chief Complaint:  Molly Perez is a 79 y.o. 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  Labs are reviewed and discussed with patient. Stable platelets Continue hydroxyurea  1000 mg 4 days/week and 500 mg 3 days/week.  Refills of hydroxyurea  was sent.  Encounter for antineoplastic chemotherapy Treatment plan as listed above.   History of anemia due to vitamin B12 deficiency Will check B12 the next visit   Skin rash erythema nodosum or cutaneous vasculitis Rash improved with prednisone , suggesting autoimmune etiology. Differential includes erythema nodosum and cutaneous vasculitis. Unlikely related to hydroxyurea . Dermatologist noted spots may take a year to resolve.  Continue follow-up with primary care physician and dermatologist if symptoms worsen.      Orders Placed This Encounter  Procedures   CBC with Differential (Cancer Center Only)    Standing Status:   Future    Expected Date:   02/19/2025    Expiration Date:   05/20/2025   CMP (Cancer Center only)    Standing Status:   Future    Expected Date:   02/19/2025    Expiration Date:   05/20/2025   Follow up  3 months, lab MD cbc cmp   All questions were answered. The patient knows to call the clinic with any problems, questions or concerns.  Molly Cap, MD, PhD [facility] Hematology Oncology 11/21/2024      PERTINENT ONCOLOGY HISTORY Molly Perez is a 79 y.o.afemale who has above oncology history reviewed by me today presented for follow up visit for management of ET Patient previously followed up by Molly Perez, patient switched care to me on 06/20/21 Extensive medical record review was performed by  me  11/05/2016. 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  Marrow was normocellular to mildy hypercellular for age (40-50%) with trilineage hematopoiesis including a proliferation of atypical megakaryocytes with focal clusters and no increase in blasts.  There was no significant increase in marrow reticulin fibers.  Storage iron was present.  Cytogenetics were normal (46, XX).  Flow cytometry was negative.  FISH studies for MPN/CML were negative.  # 12/21/2016 Started on Hydroxyurea     She has a rash that was evaluated by a dermatologist, who suspects it to be vasculitis. A biopsy was performed, but she does not recall the results. The rash was treated with prednisone , which improved the condition. She describes the rash as causing her legs to swell and feel as if they had fluid in them, although it was not itchy. The rash has left her legs 'spotty,' and she was informed it might take a year for the spots to resolve.   Interval History:  Patient presents for follow-up of management of essential thrombocythemia.   She takes hydroxyurea  as instructed.   She reports feeling well.   No new complaints.  Right lower extremity rash has improved, with residual focal discoloration.  Past Medical History:  Diagnosis Date   Cataract cortical, senile    GERD (gastroesophageal reflux disease)    Hypercholesterolemia    Obesity    Pre-diabetes    Prediabetes    Thrombocytopenia    Vitamin D  deficiency     Past Surgical History:  Procedure Laterality  Date   ABDOMINAL HYSTERECTOMY     COLONOSCOPY     COLONOSCOPY WITH PROPOFOL  N/A 07/04/2016   Procedure: COLONOSCOPY WITH PROPOFOL ;  Surgeon: Molly RAYMOND Mariner, MD;  Location: [facility] ENDOSCOPY;  Service: Endoscopy;  Laterality: N/A;   ESOPHAGOGASTRODUODENOSCOPY (EGD) WITH PROPOFOL  N/A 07/04/2016   Procedure: ESOPHAGOGASTRODUODENOSCOPY (EGD) WITH  PROPOFOL ;  Surgeon: Molly RAYMOND Mariner, MD;  Location: [facility] ENDOSCOPY;  Service: Endoscopy;  Laterality: N/A;    Family History  Problem Relation Age of Onset   Alzheimer's disease Father    Congestive Heart Failure Mother    Diabetes Mother    Multiple sclerosis Brother    Lupus Grandchild    Stroke Grandchild    Breast cancer Neg Hx     Social History:  reports that she has never smoked. Her smokeless tobacco use includes snuff. She reports that she does not drink alcohol and does not use drugs. lives in [place].   She retired at age 29.  She worked for [employer].  She denies any exposure to radiation, toxins, and smoke.The patient is alone today.  Allergies:  Allergies  Allergen Reactions   Amoxicillin Other (See Comments)   Lipitor [Atorvastatin] Other (See Comments)    Weakness in legs Weakness in legs   Penicillins Other (See Comments)    Pt states medication upsets her stomach    Current Medications: Current Outpatient Medications  Medication Sig Dispense Refill   acetaminophen  (TYLENOL ) 500 MG tablet Take 500 mg by mouth every 8 (eight) hours as needed for moderate pain.      amLODipine (NORVASC) 5 MG tablet Take 5 mg by mouth daily.     aspirin EC 81 MG tablet Take 81 mg by mouth daily.     Calcium Carbonate-Vitamin D  600-400 MG-UNIT tablet Take 2 tablets by mouth daily.     Cholecalciferol (VITAMIN D3) 5000 UNITS CAPS Take 2,000 Units by mouth daily.      hydroxyurea  (HYDREA ) 500 MG capsule TAKE 2 Perez ON MON, WED, FRI, SAT. TAKE 1 CAPSULE (500 MG) ON TUESDAY, THURSDAY, AND SUNDAY. 132 capsule 1   pantoprazole (PROTONIX) 40 MG tablet Take 40 mg by mouth daily.     pravastatin (PRAVACHOL) 80 MG tablet Take 80 mg by mouth daily.      triamcinolone  cream (KENALOG ) 0.1 % Apply 1 Application topically 2 (two) times daily. 45 g 1   vitamin B-12 (CYANOCOBALAMIN ) 1000 MCG tablet Take 1 tablet (1,000 mcg total) by mouth daily. 30 tablet 0    potassium chloride  (KLOR-CON ) 10 MEQ tablet Take 1 tablet (10 mEq total) by mouth daily for 4 days. (Patient not taking: Reported on 11/21/2024) 4 tablet 0   No current facility-administered medications for this visit.    Review of Systems  Constitutional:  Negative for chills, fever, malaise/fatigue and weight loss.  HENT:  Negative for hearing loss, nosebleeds, sore throat and tinnitus.   Eyes:  Negative for blurred vision and double vision.  Respiratory:  Negative for cough, hemoptysis, shortness of breath and wheezing.   Cardiovascular:  Negative for chest pain, palpitations and leg swelling.  Gastrointestinal:  Negative for abdominal pain, blood in stool, constipation, diarrhea, melena, nausea and vomiting.  Genitourinary:  Negative for dysuria and urgency.  Musculoskeletal:  Negative for back pain, falls, joint pain and myalgias.  Skin:  Positive for rash. Negative for itching.  Neurological:  Negative for dizziness, tingling, sensory change, loss of consciousness, weakness and headaches.  Endo/Heme/Allergies:  Negative for environmental allergies.  Does not bruise/bleed easily.  Psychiatric/Behavioral:  Negative for depression. The patient is not nervous/anxious and does not have insomnia.    Performance status (ECOG): 1  Vitals Blood pressure 133/77, pulse 71, temperature 98 F (36.7 C), resp. rate 18, weight 214 lb (97.1 kg), SpO2 98%.   Physical Exam Constitutional:      General: She is not in acute distress.    Appearance: She is not diaphoretic.  HENT:     Head: Normocephalic and atraumatic.  Eyes:     General: No scleral icterus. Cardiovascular:     Rate and Rhythm: Normal rate and regular rhythm.     Heart sounds: No murmur heard. Pulmonary:     Effort: Pulmonary effort is normal. No respiratory distress.     Breath sounds: No wheezing.  Abdominal:     General: There is no distension.     Palpations: Abdomen is soft.     Tenderness: There is no abdominal  tenderness.  Musculoskeletal:        General: Normal range of motion.     Cervical back: Normal range of motion and neck supple.  Skin:    General: Skin is warm and dry.     Findings: No erythema.     Comments: Skin discoloration on lower extremities  Neurological:     Mental Status: She is alert and oriented to person, place, and time. Mental status is at baseline.     Cranial Nerves: No cranial nerve deficit.     Motor: No abnormal muscle tone.  Psychiatric:        Mood and Affect: Mood and affect normal.      Labs.    Latest Ref Rng & Units 11/21/2024   10:26 AM 08/16/2024   12:42 PM 04/26/2024   10:26 AM  CBC  WBC 4.0 - 10.5 K/uL 6.9  8.1  6.9   Hemoglobin 12.0 - 15.0 g/dL 86.5  87.7  87.2   Hematocrit 36.0 - 46.0 % 41.5  36.8  37.9   Platelets 150 - 400 K/uL 328  305  307       Latest Ref Rng & Units 11/21/2024   10:25 AM 08/16/2024   12:42 PM 04/26/2024   10:26 AM  CMP  Glucose 70 - 99 mg/dL 86  898  891   BUN 8 - 23 mg/dL 7  10  10    Creatinine 0.44 - 1.00 mg/dL 9.04  9.06  9.16   Sodium 135 - 145 mmol/L 137  134  135   Potassium 3.5 - 5.1 mmol/L 3.9  3.7  3.7   Chloride 98 - 111 mmol/L 102  102  105   CO2 22 - 32 mmol/L 27  23  24    Calcium 8.9 - 10.3 mg/dL 9.3  8.9  8.9   Total Protein 6.5 - 8.1 g/dL 7.5  7.2  7.1   Total Bilirubin 0.0 - 1.2 mg/dL 0.3  0.6  0.7   Alkaline Phos 38 - 126 U/L 54  43  39   AST 15 - 41 U/L 18  16  16    ALT 0 - 44 U/L 9  14  15        "

## 2024-11-21 NOTE — Assessment & Plan Note (Signed)
 erythema nodosum or cutaneous vasculitis Rash improved with prednisone , suggesting autoimmune etiology. Differential includes erythema nodosum and cutaneous vasculitis. Unlikely related to hydroxyurea . Dermatologist noted spots may take a year to resolve.  Continue follow-up with primary care physician and dermatologist if symptoms worsen.

## 2025-02-22 ENCOUNTER — Inpatient Hospital Stay: Admitting: Oncology

## 2025-02-22 ENCOUNTER — Inpatient Hospital Stay
